# Patient Record
Sex: Male | Born: 1935 | Race: White | Hispanic: No | Marital: Married | Smoking: Former smoker
Health system: Southern US, Community
[De-identification: ages and names within clinical notes are randomized; demographics above are authoritative.]

## PROBLEM LIST (undated history)

## (undated) DIAGNOSIS — E119 Type 2 diabetes mellitus without complications: Secondary | ICD-10-CM

## (undated) DIAGNOSIS — M109 Gout, unspecified: Secondary | ICD-10-CM

## (undated) DIAGNOSIS — I10 Essential (primary) hypertension: Secondary | ICD-10-CM

## (undated) DIAGNOSIS — E669 Obesity, unspecified: Secondary | ICD-10-CM

## (undated) DIAGNOSIS — B029 Zoster without complications: Secondary | ICD-10-CM

## (undated) DIAGNOSIS — G4730 Sleep apnea, unspecified: Secondary | ICD-10-CM

## (undated) DIAGNOSIS — K219 Gastro-esophageal reflux disease without esophagitis: Secondary | ICD-10-CM

## (undated) DIAGNOSIS — M1990 Unspecified osteoarthritis, unspecified site: Secondary | ICD-10-CM

## (undated) DIAGNOSIS — I2510 Atherosclerotic heart disease of native coronary artery without angina pectoris: Secondary | ICD-10-CM

## (undated) DIAGNOSIS — N189 Chronic kidney disease, unspecified: Secondary | ICD-10-CM

## (undated) DIAGNOSIS — E7800 Pure hypercholesterolemia, unspecified: Secondary | ICD-10-CM

## (undated) DIAGNOSIS — E039 Hypothyroidism, unspecified: Secondary | ICD-10-CM

## (undated) DIAGNOSIS — E785 Hyperlipidemia, unspecified: Secondary | ICD-10-CM

## (undated) DIAGNOSIS — J40 Bronchitis, not specified as acute or chronic: Secondary | ICD-10-CM

## (undated) HISTORY — PX: HERNIA REPAIR: SHX51

## (undated) HISTORY — PX: CHOLECYSTECTOMY: SHX55

## (undated) HISTORY — PX: EYE SURGERY: SHX253

---

## 2004-11-02 ENCOUNTER — Ambulatory Visit: Payer: Self-pay

## 2005-10-30 ENCOUNTER — Ambulatory Visit: Payer: Self-pay | Admitting: Gastroenterology

## 2006-04-04 ENCOUNTER — Inpatient Hospital Stay: Payer: Self-pay | Admitting: Internal Medicine

## 2006-04-04 ENCOUNTER — Other Ambulatory Visit: Payer: Self-pay

## 2006-05-16 ENCOUNTER — Emergency Department: Payer: Self-pay

## 2006-05-16 ENCOUNTER — Other Ambulatory Visit: Payer: Self-pay

## 2006-06-06 ENCOUNTER — Ambulatory Visit: Payer: Self-pay

## 2006-06-13 ENCOUNTER — Other Ambulatory Visit: Payer: Self-pay

## 2006-06-13 ENCOUNTER — Ambulatory Visit: Payer: Self-pay | Admitting: Vascular Surgery

## 2006-06-20 ENCOUNTER — Ambulatory Visit: Payer: Self-pay | Admitting: Vascular Surgery

## 2006-08-27 ENCOUNTER — Ambulatory Visit: Payer: Self-pay | Admitting: Internal Medicine

## 2006-12-24 ENCOUNTER — Ambulatory Visit: Payer: Self-pay | Admitting: Vascular Surgery

## 2007-03-11 ENCOUNTER — Ambulatory Visit: Payer: Self-pay | Admitting: Ophthalmology

## 2007-03-24 ENCOUNTER — Ambulatory Visit: Payer: Self-pay | Admitting: Ophthalmology

## 2007-05-13 ENCOUNTER — Ambulatory Visit: Payer: Self-pay | Admitting: Ophthalmology

## 2007-05-21 ENCOUNTER — Ambulatory Visit: Payer: Self-pay | Admitting: Ophthalmology

## 2007-07-15 IMAGING — CR DG HAND COMPLETE 3+V*L*
1 series · 3 of 3 positions shown · non-contrast
Comparison: none

REASON FOR EXAM: eval for fracture, Dr. Haji Nasir Ahmad Niromand -[id number]
COMMENTS:

[Series 1: view not recorded · 0.17mm/px · 3 of 3 slices shown]
[im 1/3]
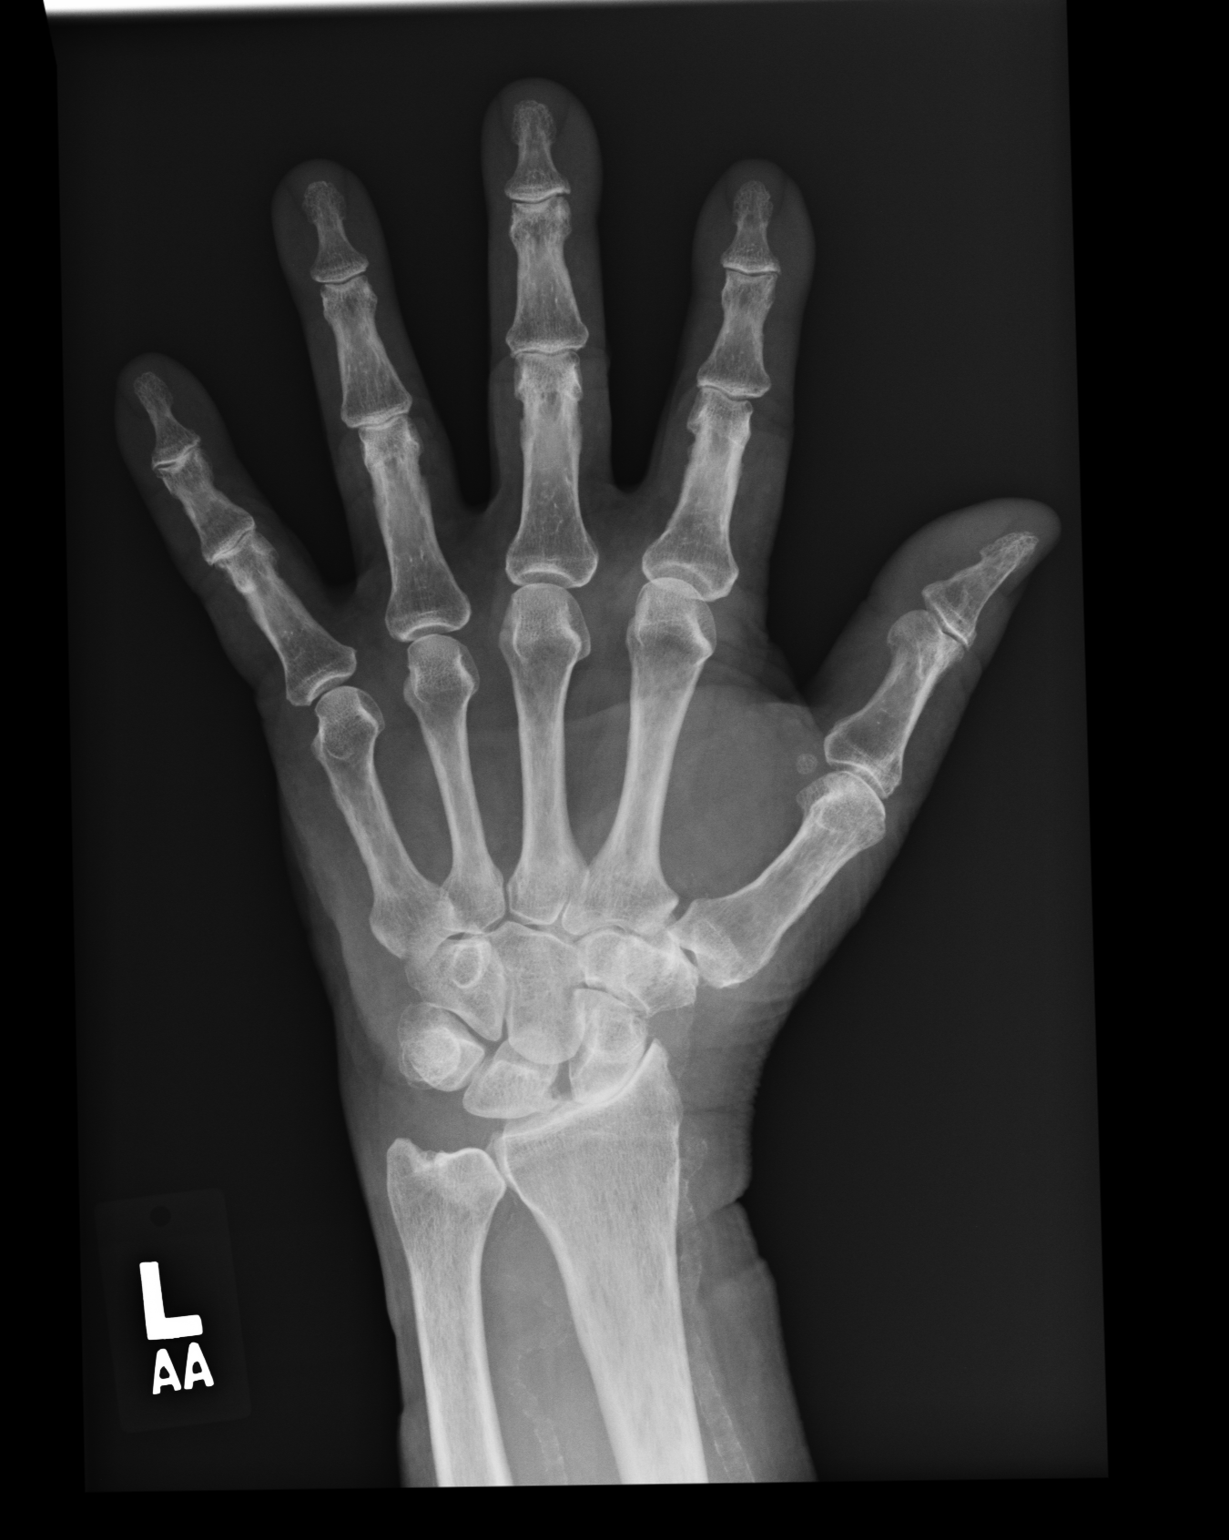
[im 2/3]
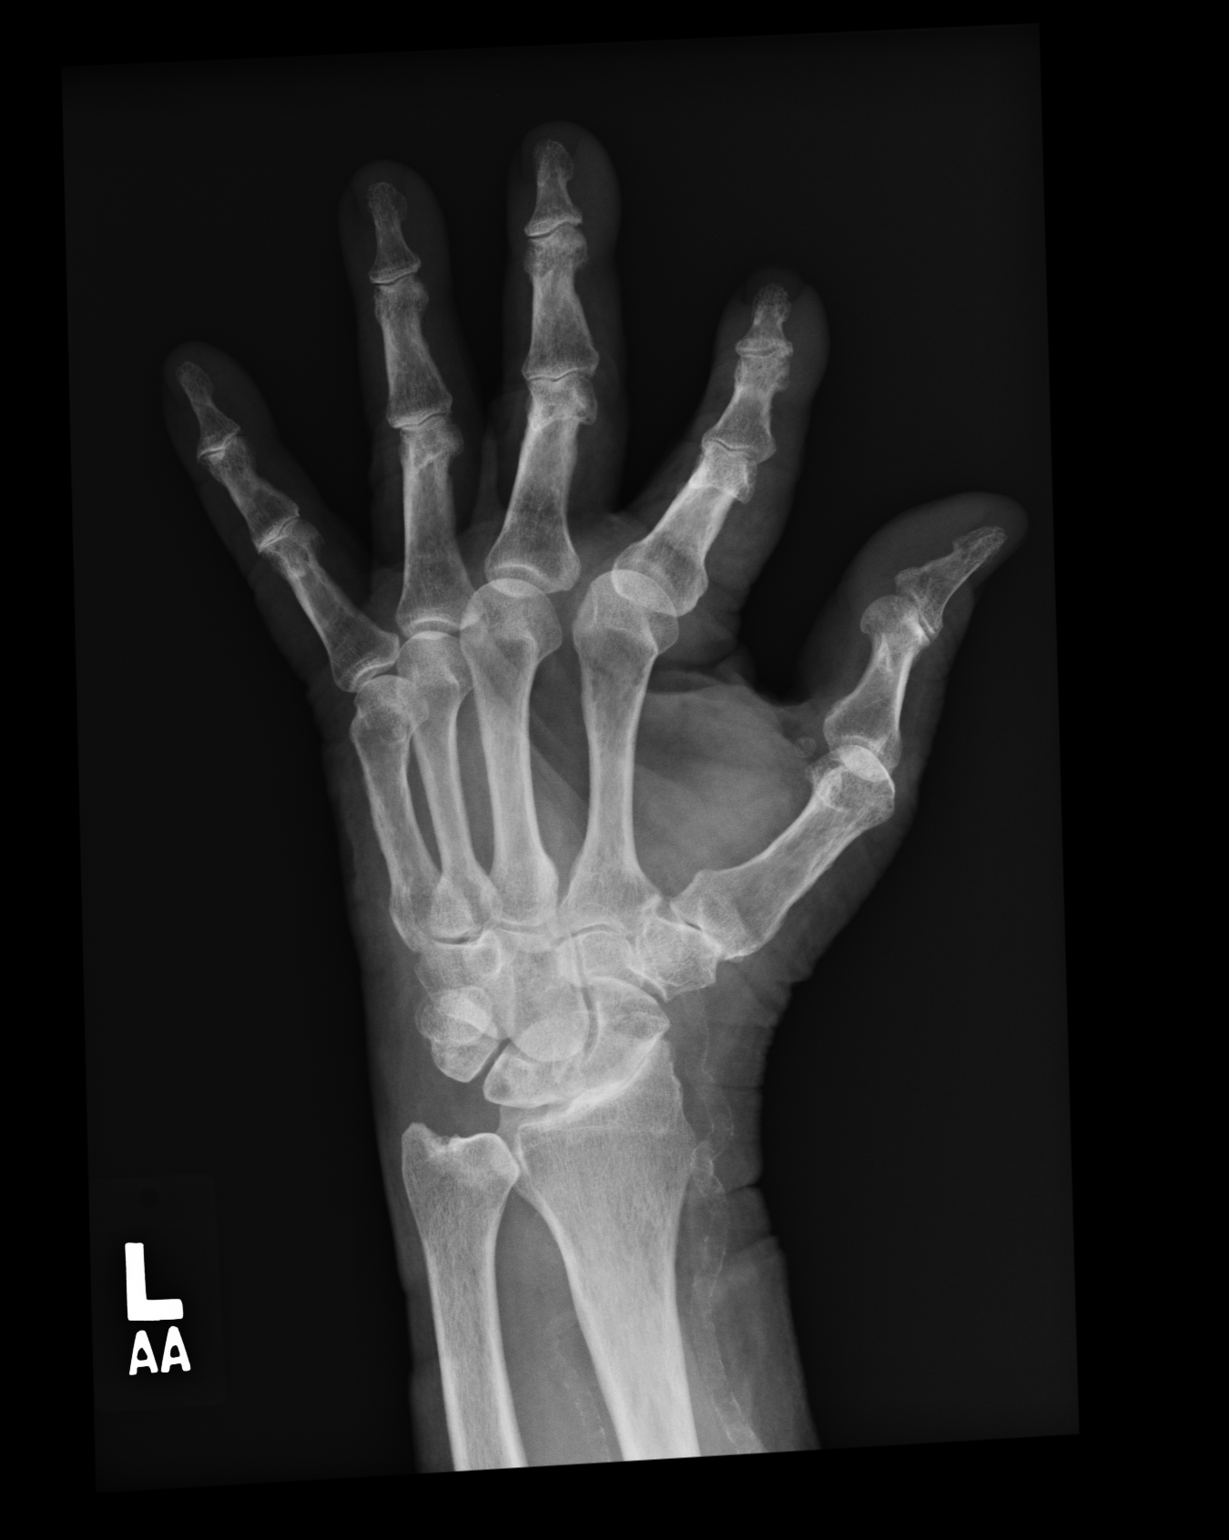
[im 3/3]
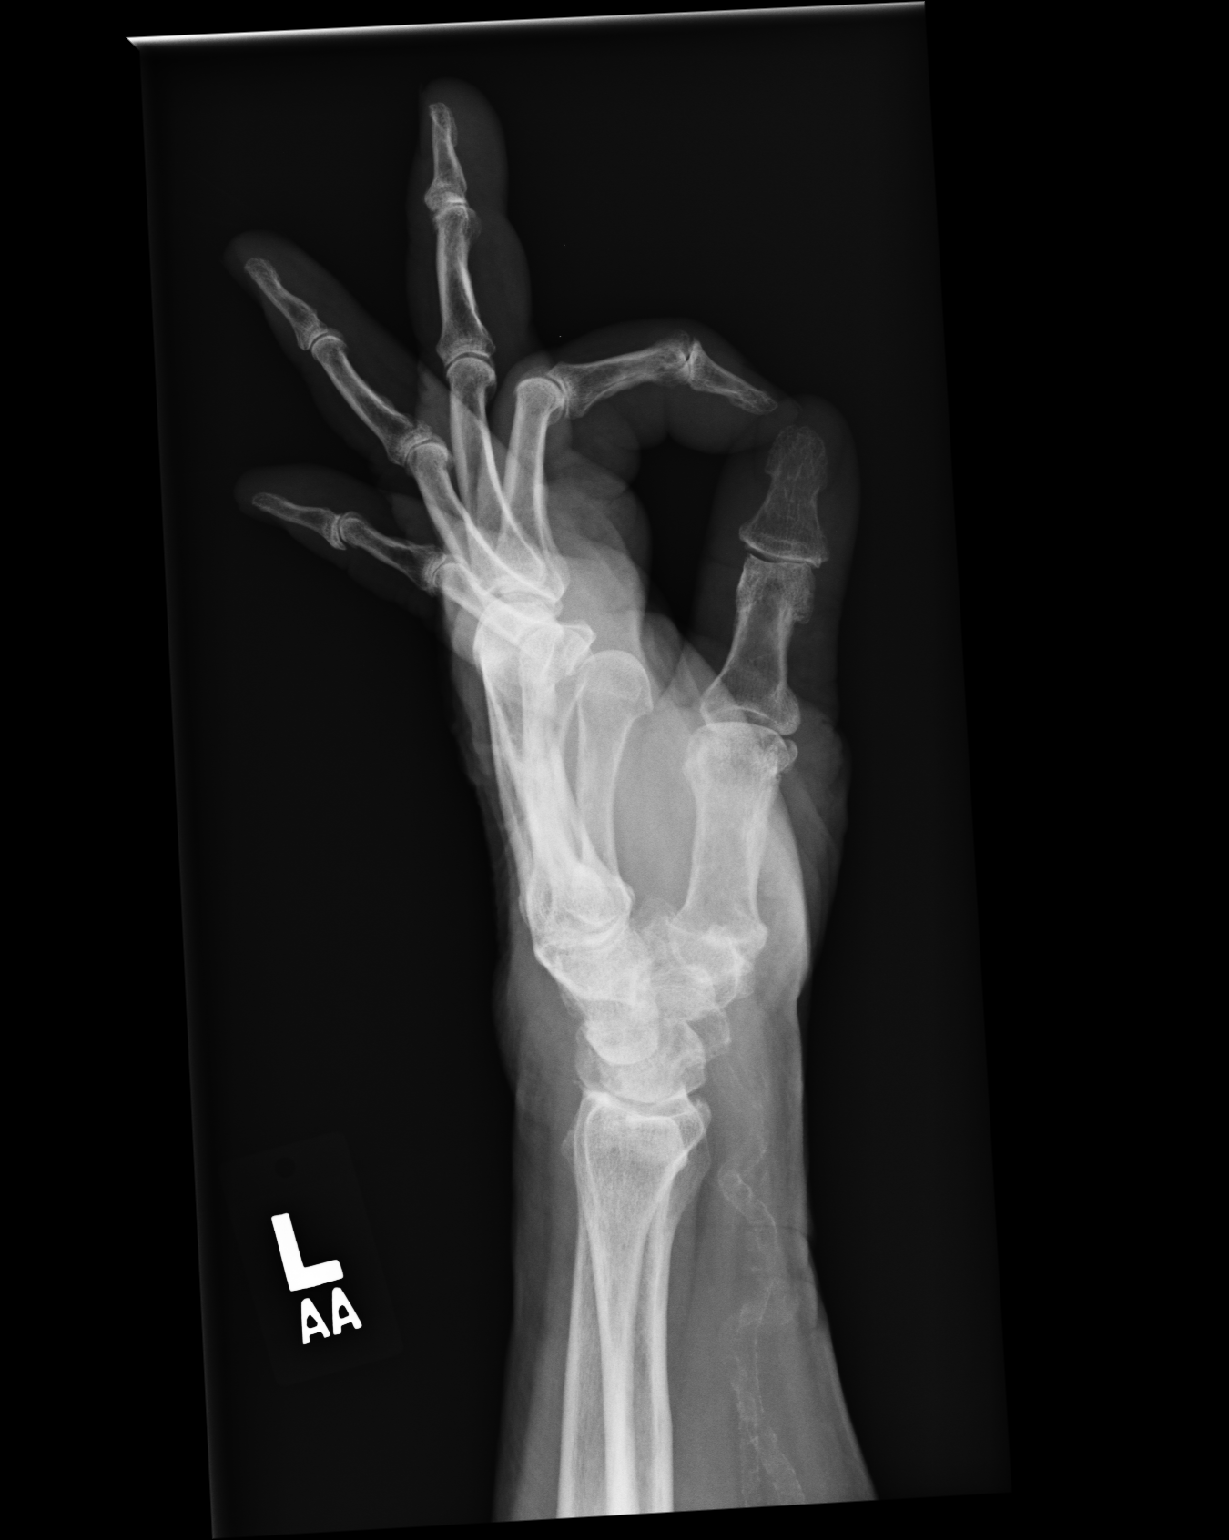

[3 of 3 positions shown; findings below may reference images not displayed]

PROCEDURE:     DXR - DXR HAND LT COMPLETE  W/OBLIQUES  - June 06, 2006  [DATE]

RESULT:     Views of the left hand show significant atherosclerotic
calcification. There is severe degenerative narrowing at the radiocarpal
joint space. Carpometacarpal degenerative change at the base of the thumb is
noted. Some interphalangeal joint space narrowing is noted proximally and
distally in multiple digits and appears to be most pronounced in the fifth
digit. No significant erosion or acute abnormality is demonstrated.
IMPRESSION: 1.     Extensive degenerative changes in the wrist and interphalangeal
joints as described. No acute abnormality demonstrated.

## 2007-08-13 ENCOUNTER — Emergency Department: Payer: Self-pay | Admitting: Emergency Medicine

## 2007-08-15 ENCOUNTER — Ambulatory Visit: Payer: Self-pay | Admitting: Vascular Surgery

## 2007-11-21 ENCOUNTER — Ambulatory Visit: Payer: Self-pay | Admitting: Nephrology

## 2007-12-02 ENCOUNTER — Other Ambulatory Visit: Payer: Self-pay

## 2007-12-02 ENCOUNTER — Emergency Department: Payer: Self-pay | Admitting: Internal Medicine

## 2010-03-14 ENCOUNTER — Ambulatory Visit: Payer: Self-pay | Admitting: Gastroenterology

## 2010-03-30 ENCOUNTER — Ambulatory Visit: Payer: Self-pay | Admitting: Gastroenterology

## 2010-04-03 LAB — PATHOLOGY REPORT

## 2010-05-25 ENCOUNTER — Ambulatory Visit: Payer: Self-pay | Admitting: Gastroenterology

## 2010-05-30 LAB — PATHOLOGY REPORT

## 2010-07-13 ENCOUNTER — Ambulatory Visit: Payer: Self-pay | Admitting: Family Medicine

## 2010-09-28 ENCOUNTER — Observation Stay: Payer: Self-pay | Admitting: Internal Medicine

## 2010-10-20 ENCOUNTER — Inpatient Hospital Stay: Payer: Self-pay | Admitting: Family Medicine

## 2011-09-23 ENCOUNTER — Emergency Department: Payer: Self-pay | Admitting: Emergency Medicine

## 2011-11-28 IMAGING — CR DG CHEST 1V PORT
1 series · 1 of 1 positions shown · non-contrast
Comparison: none

REASON FOR EXAM: BODY SWELLING
COMMENTS:

PROCEDURE:     DXR - DXR PORTABLE CHEST SINGLE VIEW  - October 20, 2010  [DATE]
RESULT:     The lungs are clear. The cardiovascular structures are
unremarkable.

[view not recorded]
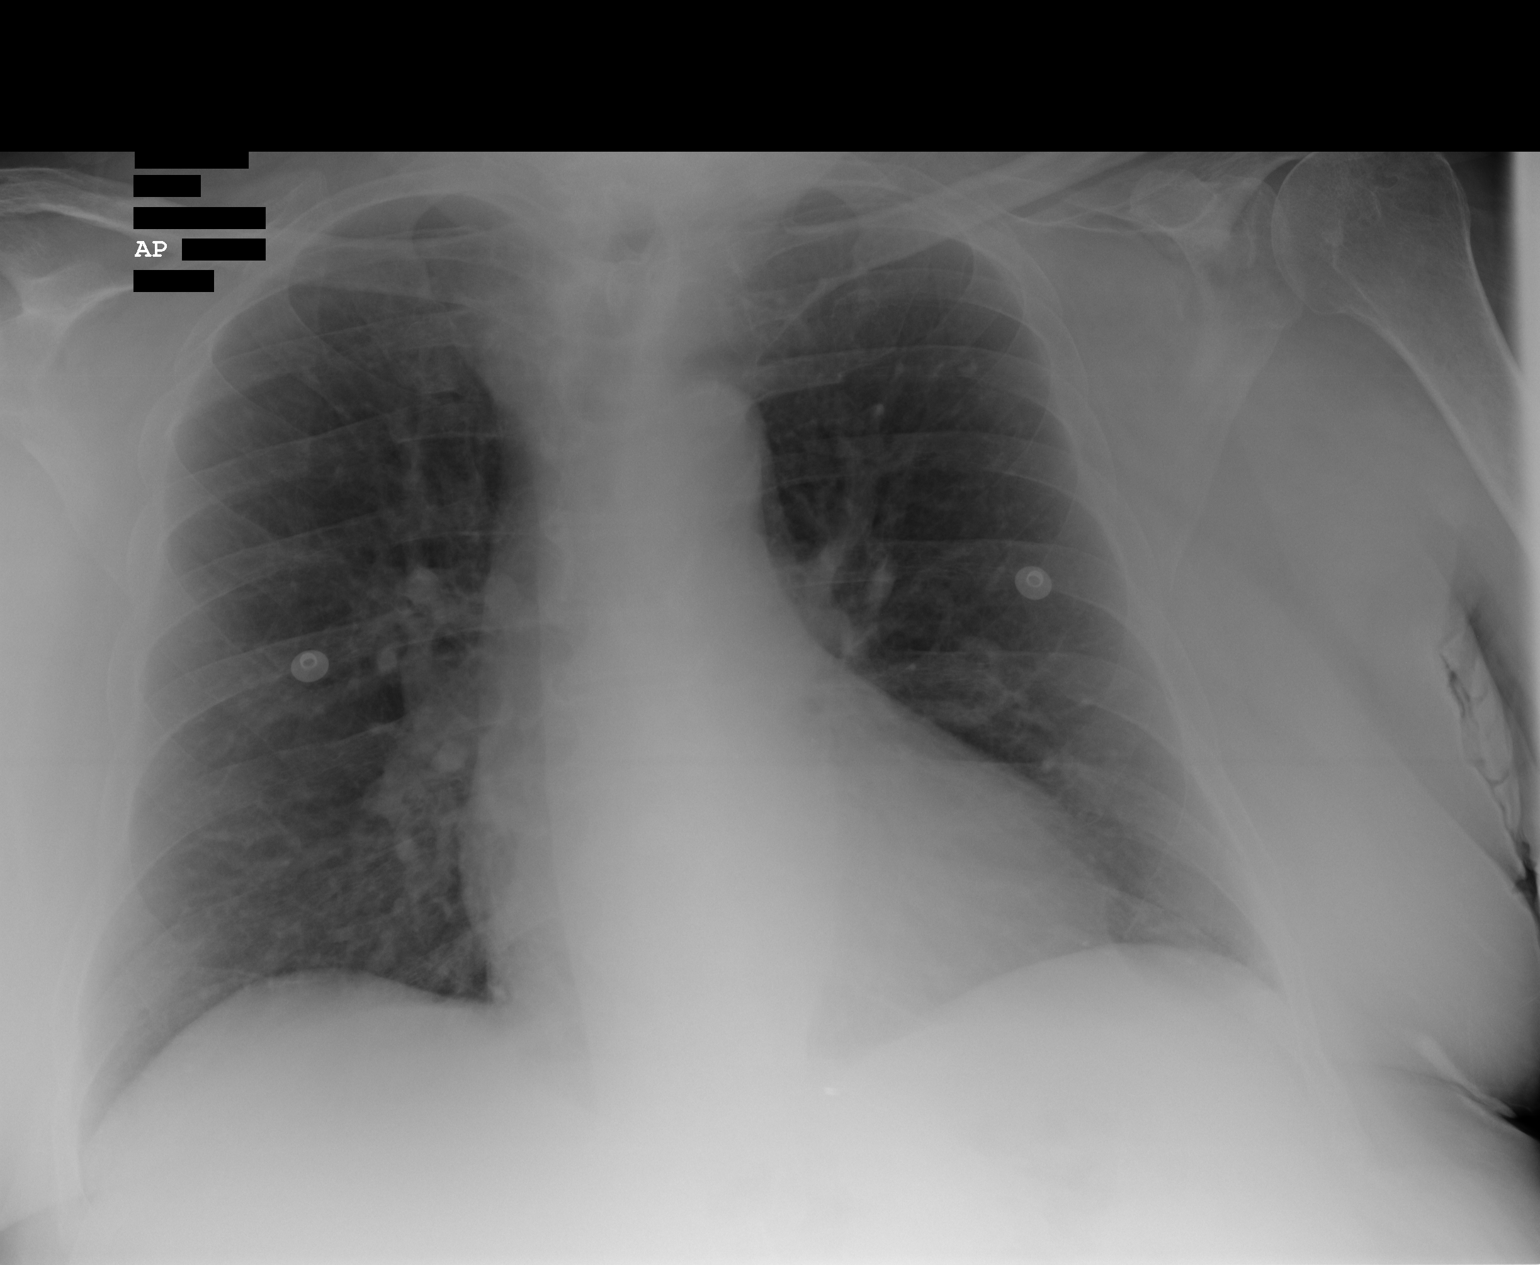

[1 of 1 positions shown; findings below may reference images not displayed]

IMPRESSION: 1. No acute abnormality.

## 2011-11-29 IMAGING — CR DG CHEST 2V
1 series · 3 of 3 positions shown · non-contrast
Comparison: none

REASON FOR EXAM: crackles. Perform AFTER hemodialysis
COMMENTS:

PROCEDURE:     DXR - DXR CHEST PA (OR AP) AND LATERAL  - October 21, 2010  [DATE]
RESULT:     The lungs are clear. The cardiovascular structures are stable
with stable cardiomegaly. No congestive heart failure or focal infiltrate.

[Series 1: view not recorded · 0.17mm/px · 3 of 3 slices shown]
[im 1/3]
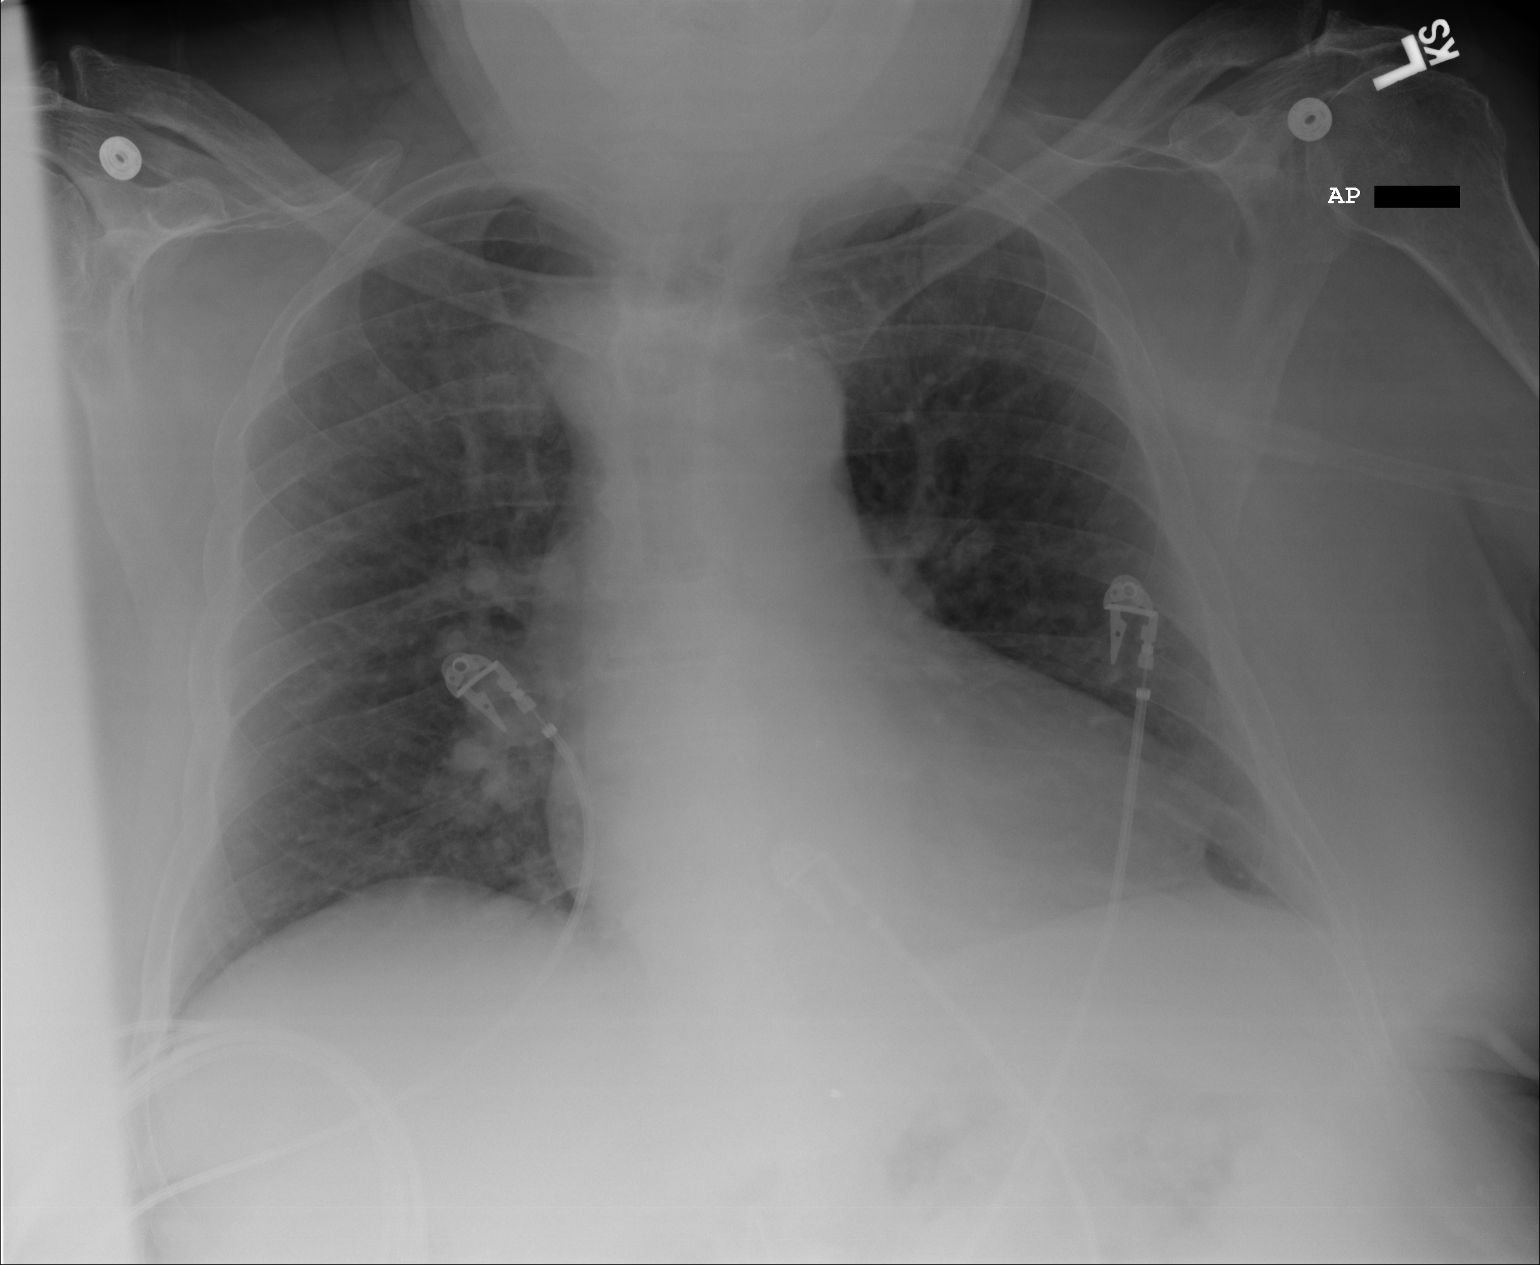
[im 2/3]
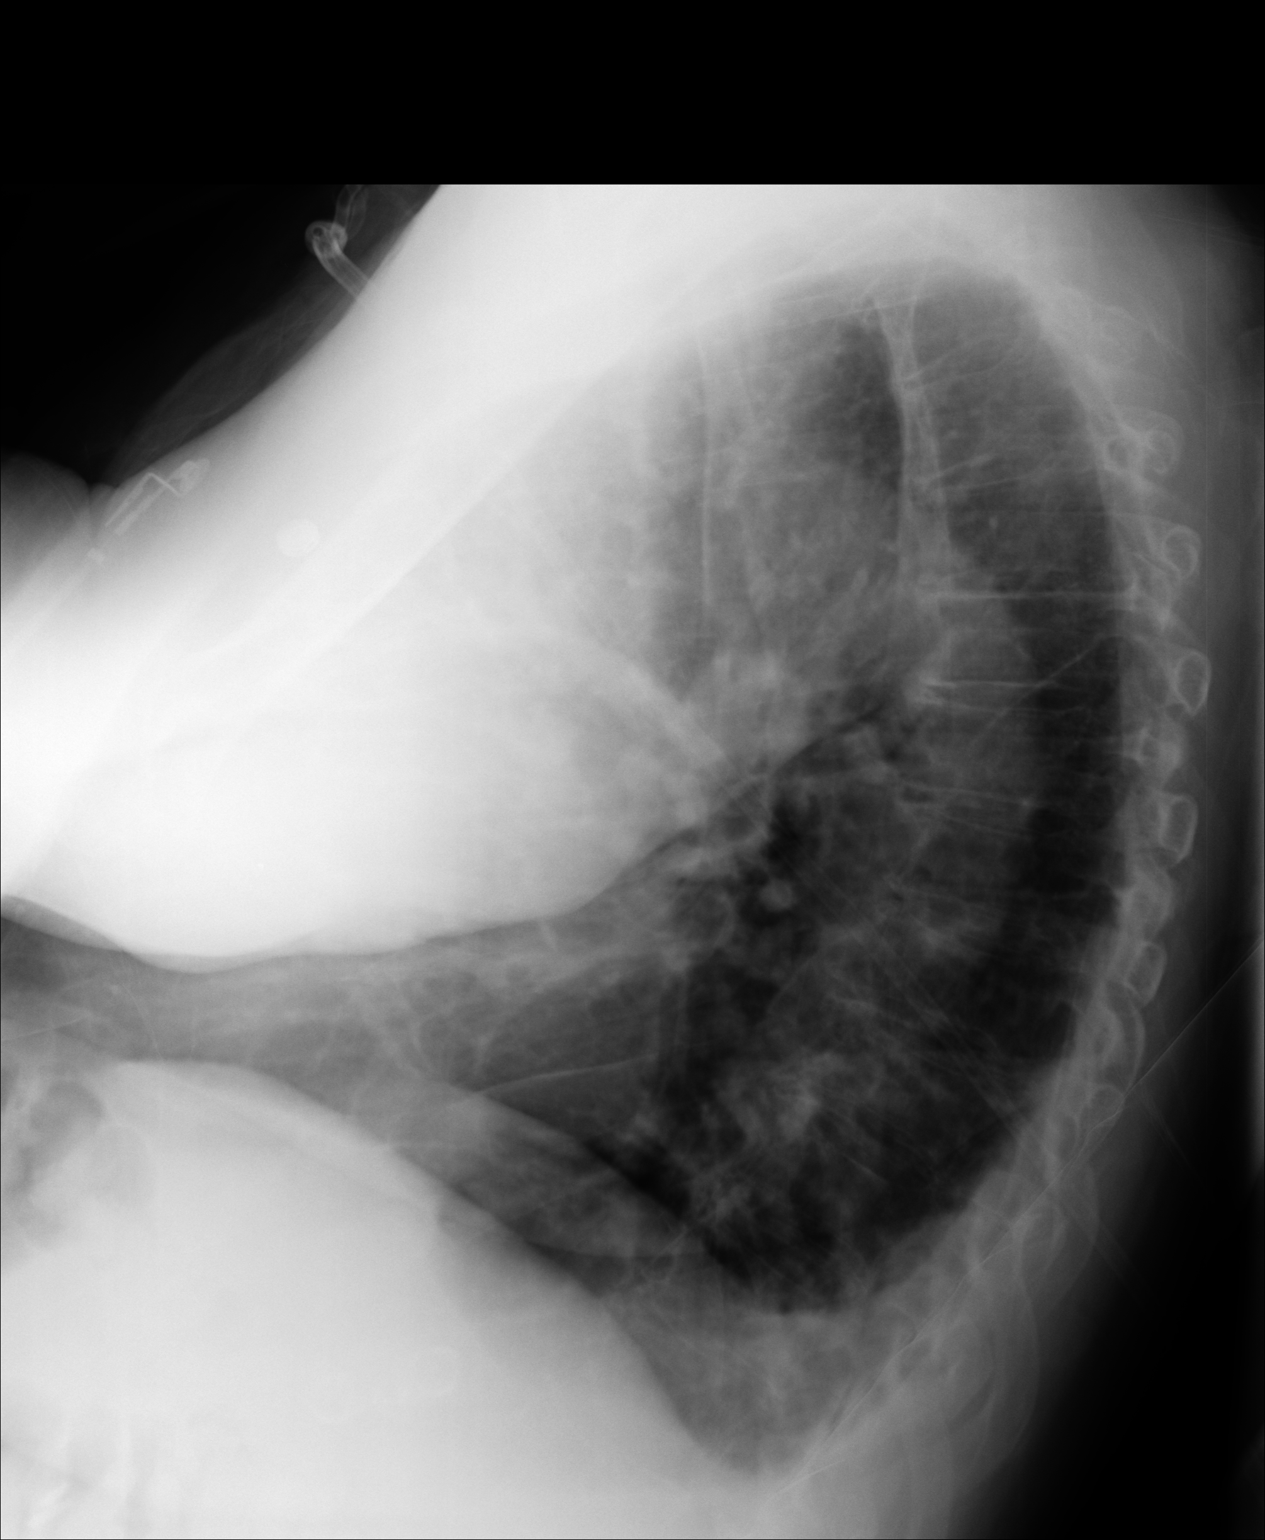
[im 3/3]
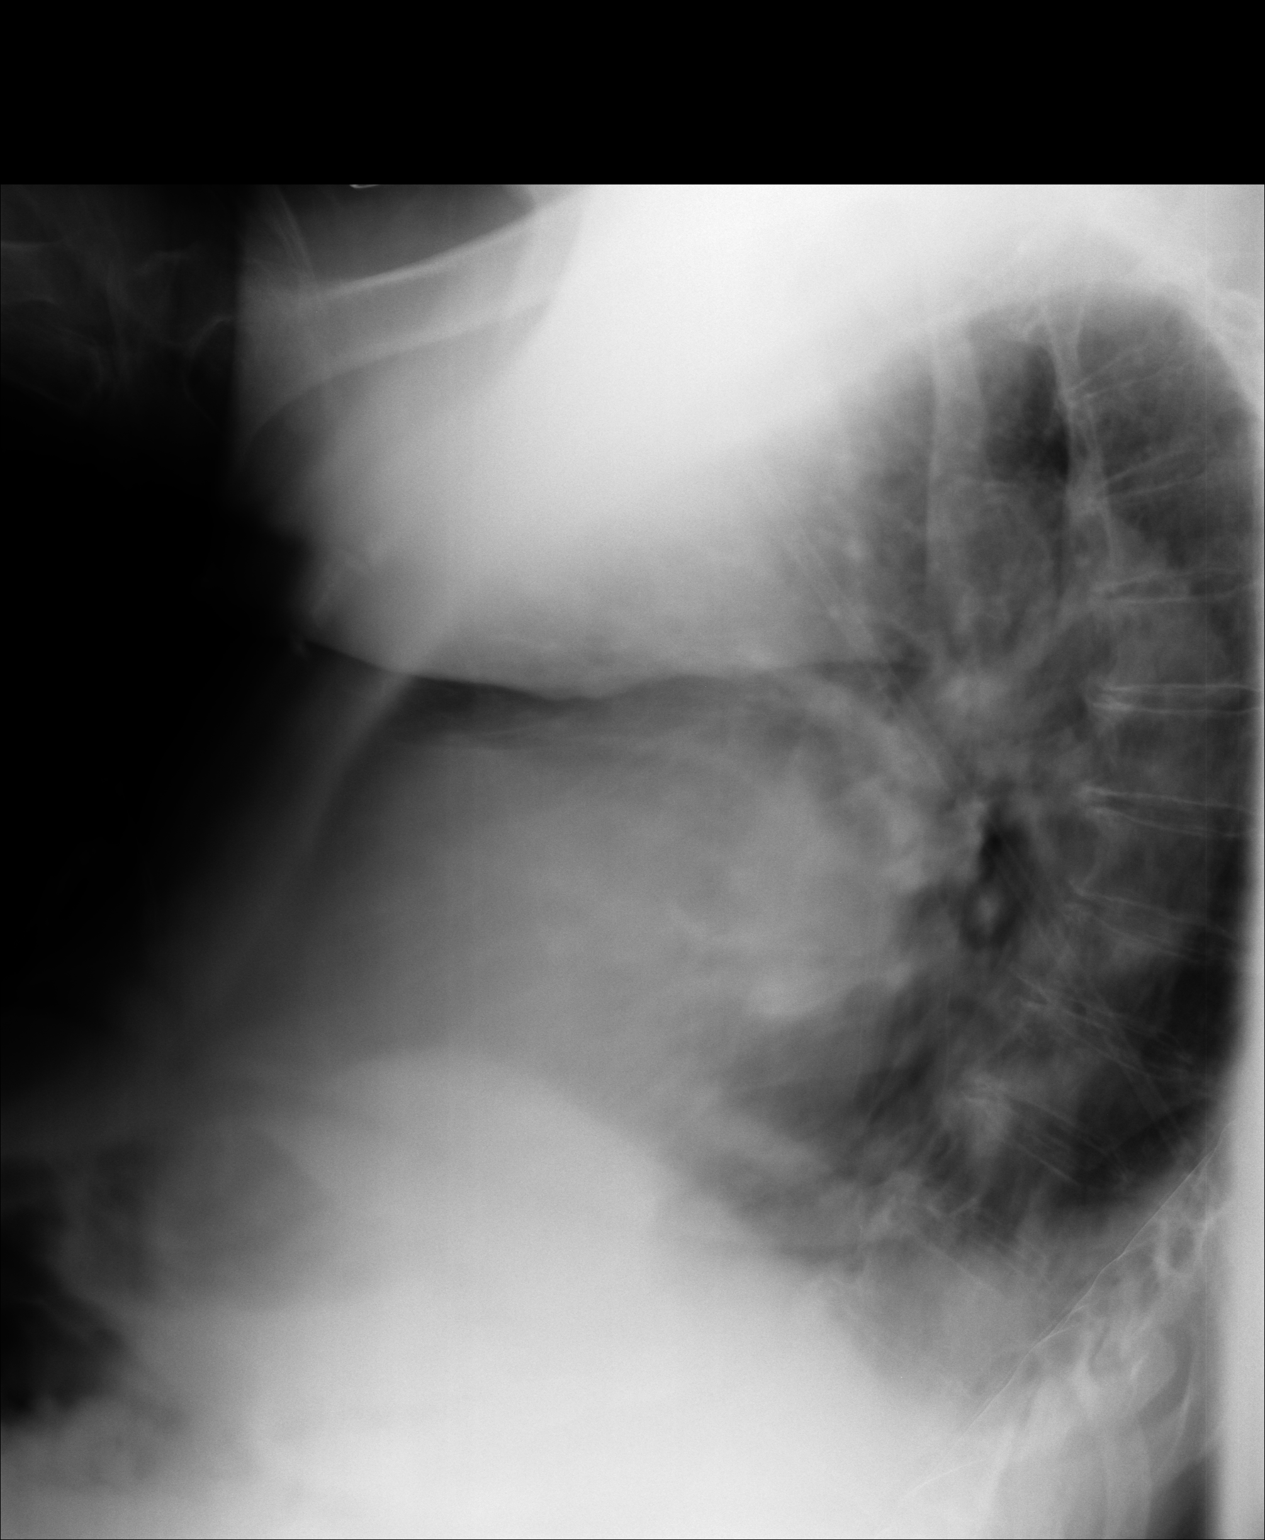

[3 of 3 positions shown; findings below may reference images not displayed]

IMPRESSION: 1. Stable cardiomegaly. No acute abnormality.

## 2012-02-12 ENCOUNTER — Ambulatory Visit: Payer: Self-pay | Admitting: Vascular Surgery

## 2012-02-16 ENCOUNTER — Emergency Department: Payer: Self-pay | Admitting: Emergency Medicine

## 2012-02-16 LAB — CBC
HCT: 32.8 % — ABNORMAL LOW (ref 40.0–52.0)
HGB: 11 g/dL — ABNORMAL LOW (ref 13.0–18.0)
MCH: 36.8 pg — ABNORMAL HIGH (ref 26.0–34.0)
MCHC: 33.6 g/dL (ref 32.0–36.0)
MCV: 110 fL — ABNORMAL HIGH (ref 80–100)
RBC: 3 10*6/uL — ABNORMAL LOW (ref 4.40–5.90)
WBC: 8.5 10*3/uL (ref 3.8–10.6)

## 2012-02-16 LAB — BASIC METABOLIC PANEL
Calcium, Total: 9.1 mg/dL (ref 8.5–10.1)
Chloride: 100 mmol/L (ref 98–107)
Creatinine: 5.73 mg/dL — ABNORMAL HIGH (ref 0.60–1.30)
EGFR (Non-African Amer.): 8 — ABNORMAL LOW
Glucose: 188 mg/dL — ABNORMAL HIGH (ref 65–99)
Potassium: 4.8 mmol/L (ref 3.5–5.1)
Sodium: 138 mmol/L (ref 136–145)

## 2012-06-05 ENCOUNTER — Ambulatory Visit: Payer: Self-pay | Admitting: Vascular Surgery

## 2012-07-24 ENCOUNTER — Ambulatory Visit: Payer: Self-pay | Admitting: Vascular Surgery

## 2012-07-24 LAB — BASIC METABOLIC PANEL
Anion Gap: 5 — ABNORMAL LOW (ref 7–16)
BUN: 17 mg/dL (ref 7–18)
Calcium, Total: 8.8 mg/dL (ref 8.5–10.1)
Chloride: 98 mmol/L (ref 98–107)
Co2: 32 mmol/L (ref 21–32)
Creatinine: 4.88 mg/dL — ABNORMAL HIGH (ref 0.60–1.30)
EGFR (African American): 11 — ABNORMAL LOW
Sodium: 135 mmol/L — ABNORMAL LOW (ref 136–145)

## 2012-07-24 LAB — CBC
HCT: 33.5 % — ABNORMAL LOW
HGB: 11.4 g/dL — ABNORMAL LOW
MCH: 36.1 pg — ABNORMAL HIGH
MCHC: 34.1 g/dL
MCV: 106 fL — ABNORMAL HIGH
Platelet: 165 x10 3/mm 3
RBC: 3.17 x10 6/mm 3 — ABNORMAL LOW
RDW: 14.8 % — ABNORMAL HIGH
WBC: 8.2 x10 3/mm 3

## 2012-08-03 ENCOUNTER — Inpatient Hospital Stay: Payer: Self-pay | Admitting: Family Medicine

## 2012-08-03 LAB — COMPREHENSIVE METABOLIC PANEL
Albumin: 3.6 g/dL (ref 3.4–5.0)
Anion Gap: 9 (ref 7–16)
BUN: 25 mg/dL — ABNORMAL HIGH (ref 7–18)
Calcium, Total: 9.1 mg/dL (ref 8.5–10.1)
Co2: 27 mmol/L (ref 21–32)
EGFR (African American): 8 — ABNORMAL LOW
EGFR (Non-African Amer.): 7 — ABNORMAL LOW
Glucose: 225 mg/dL — ABNORMAL HIGH (ref 65–99)
SGPT (ALT): 13 U/L (ref 12–78)
Total Protein: 7.5 g/dL (ref 6.4–8.2)

## 2012-08-03 LAB — CBC WITH DIFFERENTIAL/PLATELET
Basophil #: 0.1 10*3/uL (ref 0.0–0.1)
Basophil %: 0.6 %
Eosinophil #: 0.2 10*3/uL (ref 0.0–0.7)
Eosinophil %: 1.6 %
Lymphocyte #: 1.5 10*3/uL (ref 1.0–3.6)
Lymphocyte %: 12.5 %
Monocyte %: 6.1 %
Neutrophil #: 9.2 10*3/uL — ABNORMAL HIGH (ref 1.4–6.5)
Neutrophil %: 79.2 %
Platelet: 215 10*3/uL (ref 150–440)
RBC: 3.71 10*6/uL — ABNORMAL LOW (ref 4.40–5.90)
WBC: 11.6 10*3/uL — ABNORMAL HIGH (ref 3.8–10.6)

## 2012-08-03 LAB — TROPONIN I: Troponin-I: 0.02 ng/mL

## 2012-08-03 LAB — PRO B NATRIURETIC PEPTIDE: B-Type Natriuretic Peptide: 41276 pg/mL — ABNORMAL HIGH (ref 0–450)

## 2012-08-04 LAB — CBC WITH DIFFERENTIAL/PLATELET
Basophil %: 0.4 %
Eosinophil #: 0 10*3/uL (ref 0.0–0.7)
Eosinophil %: 0 %
HGB: 11.7 g/dL — ABNORMAL LOW (ref 13.0–18.0)
Lymphocyte #: 0.4 10*3/uL — ABNORMAL LOW (ref 1.0–3.6)
MCHC: 33.1 g/dL (ref 32.0–36.0)
MCV: 106 fL — ABNORMAL HIGH (ref 80–100)
Monocyte #: 0.1 x10 3/mm — ABNORMAL LOW (ref 0.2–1.0)
Neutrophil %: 89.7 %
Platelet: 172 10*3/uL (ref 150–440)
RDW: 15.1 % — ABNORMAL HIGH (ref 11.5–14.5)

## 2012-08-04 LAB — BASIC METABOLIC PANEL
BUN: 12 mg/dL (ref 7–18)
Calcium, Total: 8.9 mg/dL (ref 8.5–10.1)
Chloride: 102 mmol/L (ref 98–107)
Co2: 33 mmol/L — ABNORMAL HIGH (ref 21–32)
Creatinine: 3.7 mg/dL — ABNORMAL HIGH (ref 0.60–1.30)
EGFR (African American): 16 — ABNORMAL LOW
EGFR (Non-African Amer.): 14 — ABNORMAL LOW
Glucose: 158 mg/dL — ABNORMAL HIGH (ref 65–99)
Potassium: 4.8 mmol/L (ref 3.5–5.1)
Sodium: 138 mmol/L (ref 136–145)

## 2012-08-04 LAB — LIPID PANEL
Ldl Cholesterol, Calc: 54 mg/dL (ref 0–100)
VLDL Cholesterol, Calc: 10 mg/dL (ref 5–40)

## 2012-08-04 LAB — CLOSTRIDIUM DIFFICILE BY PCR

## 2012-08-04 LAB — PHOSPHORUS: Phosphorus: 3.1 mg/dL (ref 2.5–4.9)

## 2012-08-04 LAB — TROPONIN I
Troponin-I: 0.04 ng/mL
Troponin-I: 0.03 ng/mL

## 2012-08-05 LAB — RENAL FUNCTION PANEL
Albumin: 3 g/dL — ABNORMAL LOW (ref 3.4–5.0)
Anion Gap: 5 — ABNORMAL LOW (ref 7–16)
Chloride: 99 mmol/L (ref 98–107)
Co2: 34 mmol/L — ABNORMAL HIGH (ref 21–32)
Creatinine: 3.93 mg/dL — ABNORMAL HIGH (ref 0.60–1.30)
EGFR (African American): 15 — ABNORMAL LOW
Glucose: 112 mg/dL — ABNORMAL HIGH (ref 65–99)
Potassium: 4.1 mmol/L (ref 3.5–5.1)
Sodium: 138 mmol/L (ref 136–145)

## 2012-08-06 LAB — BASIC METABOLIC PANEL
Anion Gap: 5 — ABNORMAL LOW (ref 7–16)
BUN: 23 mg/dL — ABNORMAL HIGH (ref 7–18)
Calcium, Total: 8.8 mg/dL (ref 8.5–10.1)
Chloride: 98 mmol/L (ref 98–107)
Co2: 33 mmol/L — ABNORMAL HIGH (ref 21–32)
Creatinine: 5.38 mg/dL — ABNORMAL HIGH (ref 0.60–1.30)
EGFR (African American): 10 — ABNORMAL LOW
Glucose: 82 mg/dL (ref 65–99)
Potassium: 4.3 mmol/L (ref 3.5–5.1)
Sodium: 136 mmol/L (ref 136–145)

## 2012-08-06 LAB — CBC WITH DIFFERENTIAL/PLATELET
Basophil %: 0.7 %
Eosinophil #: 0.2 10*3/uL (ref 0.0–0.7)
Eosinophil %: 2 %
HCT: 32.5 % — ABNORMAL LOW (ref 40.0–52.0)
Lymphocyte #: 1.5 10*3/uL (ref 1.0–3.6)
MCH: 35.7 pg — ABNORMAL HIGH (ref 26.0–34.0)
MCHC: 33.8 g/dL (ref 32.0–36.0)
MCV: 106 fL — ABNORMAL HIGH (ref 80–100)
Monocyte #: 0.7 x10 3/mm (ref 0.2–1.0)
Monocyte %: 8.5 %
Neutrophil %: 71.1 %
Platelet: 151 10*3/uL (ref 150–440)
WBC: 8.3 10*3/uL (ref 3.8–10.6)

## 2012-08-06 LAB — WBCS, STOOL

## 2012-08-06 LAB — PHOSPHORUS: Phosphorus: 2 mg/dL — ABNORMAL LOW (ref 2.5–4.9)

## 2012-08-07 LAB — CBC WITH DIFFERENTIAL/PLATELET
Eosinophil #: 0.1 10*3/uL (ref 0.0–0.7)
Eosinophil %: 0.7 %
HGB: 11.4 g/dL — ABNORMAL LOW (ref 13.0–18.0)
Lymphocyte #: 0.9 10*3/uL — ABNORMAL LOW (ref 1.0–3.6)
MCH: 34.9 pg — ABNORMAL HIGH (ref 26.0–34.0)
MCV: 105 fL — ABNORMAL HIGH (ref 80–100)
Monocyte #: 1 x10 3/mm (ref 0.2–1.0)
Neutrophil #: 6.2 10*3/uL (ref 1.4–6.5)

## 2012-08-07 LAB — BASIC METABOLIC PANEL
Anion Gap: 6 — ABNORMAL LOW (ref 7–16)
Calcium, Total: 9.1 mg/dL (ref 8.5–10.1)
Chloride: 96 mmol/L — ABNORMAL LOW (ref 98–107)
Creatinine: 4.9 mg/dL — ABNORMAL HIGH (ref 0.60–1.30)
EGFR (African American): 11 — ABNORMAL LOW
EGFR (Non-African Amer.): 10 — ABNORMAL LOW
Osmolality: 273 (ref 275–301)

## 2012-08-08 LAB — CBC WITH DIFFERENTIAL/PLATELET
Basophil #: 0 10*3/uL (ref 0.0–0.1)
Basophil %: 0.3 %
Eosinophil %: 0.6 %
HCT: 33.4 % — ABNORMAL LOW (ref 40.0–52.0)
Lymphocyte #: 0.8 10*3/uL — ABNORMAL LOW (ref 1.0–3.6)
Lymphocyte %: 8.2 %
MCH: 35.5 pg — ABNORMAL HIGH (ref 26.0–34.0)
MCHC: 33.5 g/dL (ref 32.0–36.0)
MCV: 106 fL — ABNORMAL HIGH (ref 80–100)
Monocyte #: 1 x10 3/mm (ref 0.2–1.0)
Monocyte %: 10.2 %
RBC: 3.15 10*6/uL — ABNORMAL LOW (ref 4.40–5.90)
RDW: 14.8 % — ABNORMAL HIGH (ref 11.5–14.5)

## 2012-08-08 LAB — RENAL FUNCTION PANEL
Anion Gap: 6 — ABNORMAL LOW (ref 7–16)
BUN: 36 mg/dL — ABNORMAL HIGH (ref 7–18)
Co2: 31 mmol/L (ref 21–32)
Creatinine: 6.51 mg/dL — ABNORMAL HIGH (ref 0.60–1.30)
EGFR (Non-African Amer.): 7 — ABNORMAL LOW
Osmolality: 277 (ref 275–301)
Potassium: 4.7 mmol/L (ref 3.5–5.1)
Sodium: 134 mmol/L — ABNORMAL LOW (ref 136–145)

## 2012-08-09 LAB — CBC WITH DIFFERENTIAL/PLATELET
Basophil %: 0.5 %
Eosinophil #: 0.1 10*3/uL (ref 0.0–0.7)
Eosinophil %: 1 %
HCT: 33.8 % — ABNORMAL LOW (ref 40.0–52.0)
HGB: 10.9 g/dL — ABNORMAL LOW (ref 13.0–18.0)
Lymphocyte %: 4 %
MCH: 33.8 pg (ref 26.0–34.0)
MCHC: 32.2 g/dL (ref 32.0–36.0)
MCV: 105 fL — ABNORMAL HIGH (ref 80–100)
Monocyte #: 1.1 x10 3/mm — ABNORMAL HIGH (ref 0.2–1.0)
Monocyte %: 7.9 %
Neutrophil %: 86.6 %
Platelet: 154 10*3/uL (ref 150–440)
RBC: 3.21 10*6/uL — ABNORMAL LOW (ref 4.40–5.90)
RDW: 14.6 % — ABNORMAL HIGH (ref 11.5–14.5)
WBC: 13.4 10*3/uL — ABNORMAL HIGH (ref 3.8–10.6)

## 2012-08-09 LAB — BASIC METABOLIC PANEL
Anion Gap: 5 — ABNORMAL LOW (ref 7–16)
Chloride: 98 mmol/L (ref 98–107)
Co2: 30 mmol/L (ref 21–32)
Osmolality: 274 (ref 275–301)
Sodium: 133 mmol/L — ABNORMAL LOW (ref 136–145)

## 2012-08-09 LAB — CULTURE, BLOOD (SINGLE)

## 2012-08-10 LAB — CBC WITH DIFFERENTIAL/PLATELET
Basophil #: 0.1 10*3/uL (ref 0.0–0.1)
Eosinophil #: 0.3 10*3/uL (ref 0.0–0.7)
Eosinophil %: 3.3 %
HCT: 30.5 % — ABNORMAL LOW (ref 40.0–52.0)
Lymphocyte %: 11.7 %
Neutrophil %: 74.8 %
Platelet: 165 10*3/uL (ref 150–440)
WBC: 8.9 10*3/uL (ref 3.8–10.6)

## 2012-08-11 LAB — RENAL FUNCTION PANEL
Albumin: 2.6 g/dL — ABNORMAL LOW (ref 3.4–5.0)
Calcium, Total: 9.3 mg/dL (ref 8.5–10.1)
Chloride: 93 mmol/L — ABNORMAL LOW (ref 98–107)
Co2: 29 mmol/L (ref 21–32)
Osmolality: 278 (ref 275–301)
Sodium: 132 mmol/L — ABNORMAL LOW (ref 136–145)

## 2012-08-11 LAB — CBC WITH DIFFERENTIAL/PLATELET
Basophil #: 0.1 10*3/uL (ref 0.0–0.1)
Basophil %: 0.8 %
HGB: 10.5 g/dL — ABNORMAL LOW (ref 13.0–18.0)
Lymphocyte #: 0.8 10*3/uL — ABNORMAL LOW (ref 1.0–3.6)
Lymphocyte %: 11.9 %
MCH: 34.3 pg — ABNORMAL HIGH (ref 26.0–34.0)
MCHC: 32.6 g/dL (ref 32.0–36.0)
Monocyte #: 0.5 x10 3/mm (ref 0.2–1.0)
Neutrophil #: 5 10*3/uL (ref 1.4–6.5)
Neutrophil %: 75.2 %
RBC: 3.06 10*6/uL — ABNORMAL LOW (ref 4.40–5.90)
RDW: 14.8 % — ABNORMAL HIGH (ref 11.5–14.5)
WBC: 6.6 10*3/uL (ref 3.8–10.6)

## 2013-05-10 ENCOUNTER — Inpatient Hospital Stay: Payer: Self-pay | Admitting: Family Medicine

## 2013-05-10 LAB — COMPREHENSIVE METABOLIC PANEL
ALBUMIN: 3.2 g/dL — AB (ref 3.4–5.0)
Alkaline Phosphatase: 78 U/L
Anion Gap: 7 (ref 7–16)
BUN: 44 mg/dL — ABNORMAL HIGH (ref 7–18)
Bilirubin,Total: 0.4 mg/dL (ref 0.2–1.0)
Calcium, Total: 9.5 mg/dL (ref 8.5–10.1)
Chloride: 95 mmol/L — ABNORMAL LOW (ref 98–107)
Co2: 29 mmol/L (ref 21–32)
Creatinine: 8.29 mg/dL — ABNORMAL HIGH (ref 0.60–1.30)
EGFR (African American): 6 — ABNORMAL LOW
EGFR (Non-African Amer.): 5 — ABNORMAL LOW
Glucose: 164 mg/dL — ABNORMAL HIGH (ref 65–99)
Osmolality: 277 (ref 275–301)
POTASSIUM: 5 mmol/L (ref 3.5–5.1)
SGOT(AST): 13 U/L — ABNORMAL LOW (ref 15–37)
SGPT (ALT): 15 U/L (ref 12–78)
SODIUM: 131 mmol/L — AB (ref 136–145)
Total Protein: 7.2 g/dL (ref 6.4–8.2)

## 2013-05-10 LAB — CBC
HCT: 30.5 % — AB (ref 40.0–52.0)
HGB: 10.4 g/dL — AB (ref 13.0–18.0)
MCH: 36.3 pg — AB (ref 26.0–34.0)
MCHC: 34 g/dL (ref 32.0–36.0)
MCV: 107 fL — AB (ref 80–100)
PLATELETS: 170 10*3/uL (ref 150–440)
RBC: 2.85 10*6/uL — AB (ref 4.40–5.90)
RDW: 14.5 % (ref 11.5–14.5)
WBC: 13.2 10*3/uL — ABNORMAL HIGH (ref 3.8–10.6)

## 2013-05-10 LAB — PROTIME-INR
INR: 1
PROTHROMBIN TIME: 13.2 s (ref 11.5–14.7)

## 2013-05-11 LAB — CBC WITH DIFFERENTIAL/PLATELET
Basophil #: 0 10*3/uL (ref 0.0–0.1)
Basophil %: 0.1 %
EOS ABS: 0 10*3/uL (ref 0.0–0.7)
Eosinophil %: 0 %
HCT: 27.4 % — ABNORMAL LOW (ref 40.0–52.0)
HGB: 9.5 g/dL — AB (ref 13.0–18.0)
Lymphocyte #: 0.3 10*3/uL — ABNORMAL LOW (ref 1.0–3.6)
Lymphocyte %: 2.2 %
MCH: 37.4 pg — ABNORMAL HIGH (ref 26.0–34.0)
MCHC: 34.7 g/dL (ref 32.0–36.0)
MCV: 108 fL — AB (ref 80–100)
MONO ABS: 0.2 x10 3/mm (ref 0.2–1.0)
Monocyte %: 1.3 %
NEUTROS PCT: 96.4 %
Neutrophil #: 12.4 10*3/uL — ABNORMAL HIGH (ref 1.4–6.5)
Platelet: 145 10*3/uL — ABNORMAL LOW (ref 150–440)
RBC: 2.54 10*6/uL — AB (ref 4.40–5.90)
RDW: 14.4 % (ref 11.5–14.5)
WBC: 12.8 10*3/uL — AB (ref 3.8–10.6)

## 2013-05-11 LAB — BASIC METABOLIC PANEL
ANION GAP: 8 (ref 7–16)
BUN: 52 mg/dL — ABNORMAL HIGH (ref 7–18)
CO2: 25 mmol/L (ref 21–32)
CREATININE: 8.82 mg/dL — AB (ref 0.60–1.30)
Calcium, Total: 9.3 mg/dL (ref 8.5–10.1)
Chloride: 93 mmol/L — ABNORMAL LOW (ref 98–107)
EGFR (Non-African Amer.): 5 — ABNORMAL LOW
GFR CALC AF AMER: 6 — AB
GLUCOSE: 258 mg/dL — AB (ref 65–99)
Osmolality: 276 (ref 275–301)
Potassium: 6 mmol/L — ABNORMAL HIGH (ref 3.5–5.1)
Sodium: 126 mmol/L — ABNORMAL LOW (ref 136–145)

## 2013-05-11 LAB — PHOSPHORUS: Phosphorus: 4.5 mg/dL (ref 2.5–4.9)

## 2013-05-12 LAB — CBC WITH DIFFERENTIAL/PLATELET
BASOS ABS: 0 10*3/uL (ref 0.0–0.1)
Basophil %: 0.3 %
EOS ABS: 0 10*3/uL (ref 0.0–0.7)
Eosinophil %: 0.2 %
HCT: 28.1 % — ABNORMAL LOW (ref 40.0–52.0)
HGB: 9.8 g/dL — ABNORMAL LOW (ref 13.0–18.0)
Lymphocyte #: 0.9 10*3/uL — ABNORMAL LOW (ref 1.0–3.6)
Lymphocyte %: 10.3 %
MCH: 37 pg — AB (ref 26.0–34.0)
MCHC: 34.7 g/dL (ref 32.0–36.0)
MCV: 107 fL — ABNORMAL HIGH (ref 80–100)
Monocyte #: 1 x10 3/mm (ref 0.2–1.0)
Monocyte %: 11.3 %
NEUTROS ABS: 7.1 10*3/uL — AB (ref 1.4–6.5)
Neutrophil %: 77.9 %
PLATELETS: 170 10*3/uL (ref 150–440)
RBC: 2.64 10*6/uL — ABNORMAL LOW (ref 4.40–5.90)
RDW: 14 % (ref 11.5–14.5)
WBC: 9.1 10*3/uL (ref 3.8–10.6)

## 2013-05-12 LAB — BASIC METABOLIC PANEL
ANION GAP: 8 (ref 7–16)
BUN: 26 mg/dL — ABNORMAL HIGH (ref 7–18)
CALCIUM: 9.1 mg/dL (ref 8.5–10.1)
CHLORIDE: 98 mmol/L (ref 98–107)
CO2: 33 mmol/L — AB (ref 21–32)
Creatinine: 5.27 mg/dL — ABNORMAL HIGH (ref 0.60–1.30)
EGFR (African American): 10 — ABNORMAL LOW
EGFR (Non-African Amer.): 9 — ABNORMAL LOW
Glucose: 105 mg/dL — ABNORMAL HIGH (ref 65–99)
OSMOLALITY: 283 (ref 275–301)
Potassium: 4.2 mmol/L (ref 3.5–5.1)
Sodium: 139 mmol/L (ref 136–145)

## 2013-05-13 LAB — RENAL FUNCTION PANEL
Albumin: 2.8 g/dL — ABNORMAL LOW (ref 3.4–5.0)
Anion Gap: 6 — ABNORMAL LOW (ref 7–16)
BUN: 41 mg/dL — ABNORMAL HIGH (ref 7–18)
Calcium, Total: 9.4 mg/dL (ref 8.5–10.1)
Chloride: 96 mmol/L — ABNORMAL LOW (ref 98–107)
Co2: 31 mmol/L (ref 21–32)
Creatinine: 7.23 mg/dL — ABNORMAL HIGH (ref 0.60–1.30)
EGFR (African American): 7 — ABNORMAL LOW
EGFR (Non-African Amer.): 6 — ABNORMAL LOW
Glucose: 99 mg/dL (ref 65–99)
Osmolality: 277 (ref 275–301)
Phosphorus: 4.7 mg/dL (ref 2.5–4.9)
Potassium: 4.4 mmol/L (ref 3.5–5.1)
Sodium: 133 mmol/L — ABNORMAL LOW (ref 136–145)

## 2013-05-13 LAB — CBC WITH DIFFERENTIAL/PLATELET
Basophil #: 0 10*3/uL (ref 0.0–0.1)
Basophil %: 0.5 %
EOS PCT: 1.6 %
Eosinophil #: 0.1 10*3/uL (ref 0.0–0.7)
HCT: 29.4 % — ABNORMAL LOW (ref 40.0–52.0)
HGB: 9.9 g/dL — ABNORMAL LOW (ref 13.0–18.0)
Lymphocyte #: 0.7 10*3/uL — ABNORMAL LOW (ref 1.0–3.6)
Lymphocyte %: 9.8 %
MCH: 36.4 pg — ABNORMAL HIGH (ref 26.0–34.0)
MCHC: 33.8 g/dL (ref 32.0–36.0)
MCV: 108 fL — ABNORMAL HIGH (ref 80–100)
Monocyte #: 1.1 x10 3/mm — ABNORMAL HIGH (ref 0.2–1.0)
Monocyte %: 14.3 %
NEUTROS ABS: 5.4 10*3/uL (ref 1.4–6.5)
NEUTROS PCT: 73.8 %
Platelet: 164 10*3/uL (ref 150–440)
RBC: 2.73 10*6/uL — ABNORMAL LOW (ref 4.40–5.90)
RDW: 14.1 % (ref 11.5–14.5)
WBC: 7.4 10*3/uL (ref 3.8–10.6)

## 2013-05-14 LAB — CBC WITH DIFFERENTIAL/PLATELET
Basophil #: 0 10*3/uL (ref 0.0–0.1)
Basophil %: 0.7 %
EOS ABS: 0.1 10*3/uL (ref 0.0–0.7)
Eosinophil %: 1.6 %
HCT: 28.8 % — ABNORMAL LOW (ref 40.0–52.0)
HGB: 9.9 g/dL — AB (ref 13.0–18.0)
LYMPHS PCT: 13.3 %
Lymphocyte #: 0.9 10*3/uL — ABNORMAL LOW (ref 1.0–3.6)
MCH: 37 pg — ABNORMAL HIGH (ref 26.0–34.0)
MCHC: 34.3 g/dL (ref 32.0–36.0)
MCV: 108 fL — AB (ref 80–100)
MONOS PCT: 14.8 %
Monocyte #: 1 x10 3/mm (ref 0.2–1.0)
NEUTROS ABS: 4.5 10*3/uL (ref 1.4–6.5)
NEUTROS PCT: 69.6 %
Platelet: 156 10*3/uL (ref 150–440)
RBC: 2.68 10*6/uL — AB (ref 4.40–5.90)
RDW: 14.2 % (ref 11.5–14.5)
WBC: 6.5 10*3/uL (ref 3.8–10.6)

## 2013-05-14 LAB — BASIC METABOLIC PANEL
ANION GAP: 7 (ref 7–16)
BUN: 22 mg/dL — ABNORMAL HIGH (ref 7–18)
CO2: 34 mmol/L — AB (ref 21–32)
CREATININE: 5.22 mg/dL — AB (ref 0.60–1.30)
Calcium, Total: 9.3 mg/dL (ref 8.5–10.1)
Chloride: 99 mmol/L (ref 98–107)
EGFR (African American): 11 — ABNORMAL LOW
GFR CALC NON AF AMER: 9 — AB
Glucose: 61 mg/dL — ABNORMAL LOW (ref 65–99)
Osmolality: 281 (ref 275–301)
POTASSIUM: 4.3 mmol/L (ref 3.5–5.1)
SODIUM: 140 mmol/L (ref 136–145)

## 2013-09-17 ENCOUNTER — Ambulatory Visit: Payer: Self-pay | Admitting: Otolaryngology

## 2013-10-13 ENCOUNTER — Ambulatory Visit: Payer: Self-pay | Admitting: Gastroenterology

## 2013-10-29 ENCOUNTER — Ambulatory Visit: Payer: Self-pay | Admitting: Vascular Surgery

## 2013-12-19 ENCOUNTER — Emergency Department: Payer: Self-pay | Admitting: Emergency Medicine

## 2013-12-22 ENCOUNTER — Emergency Department: Payer: Self-pay | Admitting: Emergency Medicine

## 2013-12-26 ENCOUNTER — Emergency Department: Payer: Self-pay | Admitting: Internal Medicine

## 2014-01-02 ENCOUNTER — Emergency Department: Payer: Self-pay | Admitting: Internal Medicine

## 2014-03-25 ENCOUNTER — Inpatient Hospital Stay: Payer: Self-pay

## 2014-03-25 ENCOUNTER — Ambulatory Visit: Payer: Self-pay | Admitting: Emergency Medicine

## 2014-03-25 LAB — COMPREHENSIVE METABOLIC PANEL
AST: 18 U/L (ref 15–37)
Albumin: 3 g/dL — ABNORMAL LOW (ref 3.4–5.0)
Alkaline Phosphatase: 62 U/L
Anion Gap: 7 (ref 7–16)
BILIRUBIN TOTAL: 0.5 mg/dL (ref 0.2–1.0)
BUN: 31 mg/dL — AB (ref 7–18)
CHLORIDE: 99 mmol/L (ref 98–107)
CO2: 31 mmol/L (ref 21–32)
Calcium, Total: 8.9 mg/dL (ref 8.5–10.1)
Creatinine: 6.97 mg/dL — ABNORMAL HIGH (ref 0.60–1.30)
EGFR (African American): 10 — ABNORMAL LOW
GFR CALC NON AF AMER: 8 — AB
GLUCOSE: 171 mg/dL — AB (ref 65–99)
Osmolality: 284 (ref 275–301)
Potassium: 5.3 mmol/L — ABNORMAL HIGH (ref 3.5–5.1)
SGPT (ALT): 14 U/L
SODIUM: 137 mmol/L (ref 136–145)
TOTAL PROTEIN: 6.4 g/dL (ref 6.4–8.2)

## 2014-03-25 LAB — CBC
HCT: 33.6 % — AB (ref 40.0–52.0)
HGB: 11 g/dL — ABNORMAL LOW (ref 13.0–18.0)
MCH: 36.1 pg — ABNORMAL HIGH (ref 26.0–34.0)
MCHC: 32.8 g/dL (ref 32.0–36.0)
MCV: 110 fL — ABNORMAL HIGH (ref 80–100)
Platelet: 151 10*3/uL (ref 150–440)
RBC: 3.05 10*6/uL — ABNORMAL LOW (ref 4.40–5.90)
RDW: 14.2 % (ref 11.5–14.5)
WBC: 11.4 10*3/uL — AB (ref 3.8–10.6)

## 2014-03-25 LAB — CK TOTAL AND CKMB (NOT AT ARMC)
CK, Total: 23 U/L — ABNORMAL LOW (ref 39–308)
CK-MB: 1.2 ng/mL (ref 0.5–3.6)

## 2014-03-25 LAB — TROPONIN I
Troponin-I: <0.02 ng/mL
Troponin-I: <0.02 ng/mL

## 2014-03-26 LAB — CBC WITH DIFFERENTIAL/PLATELET
BASOS PCT: 0.7 %
Basophil #: 0.1 10*3/uL (ref 0.0–0.1)
EOS ABS: 0.1 10*3/uL (ref 0.0–0.7)
EOS PCT: 0.8 %
HCT: 31 % — ABNORMAL LOW (ref 40.0–52.0)
HGB: 10.3 g/dL — AB (ref 13.0–18.0)
LYMPHS ABS: 1.3 10*3/uL (ref 1.0–3.6)
LYMPHS PCT: 13.4 %
MCH: 36.4 pg — ABNORMAL HIGH (ref 26.0–34.0)
MCHC: 33.3 g/dL (ref 32.0–36.0)
MCV: 109 fL — ABNORMAL HIGH (ref 80–100)
MONO ABS: 0.9 x10 3/mm (ref 0.2–1.0)
MONOS PCT: 9.6 %
NEUTROS ABS: 7.4 10*3/uL — AB (ref 1.4–6.5)
Neutrophil %: 75.5 %
Platelet: 149 10*3/uL — ABNORMAL LOW (ref 150–440)
RBC: 2.84 10*6/uL — ABNORMAL LOW (ref 4.40–5.90)
RDW: 14.5 % (ref 11.5–14.5)
WBC: 9.8 10*3/uL (ref 3.8–10.6)

## 2014-03-26 LAB — BASIC METABOLIC PANEL
ANION GAP: 7 (ref 7–16)
BUN: 38 mg/dL — AB (ref 7–18)
CREATININE: 7.79 mg/dL — AB (ref 0.60–1.30)
Calcium, Total: 8.9 mg/dL (ref 8.5–10.1)
Chloride: 97 mmol/L — ABNORMAL LOW (ref 98–107)
Co2: 30 mmol/L (ref 21–32)
EGFR (African American): 8 — ABNORMAL LOW
EGFR (Non-African Amer.): 7 — ABNORMAL LOW
Glucose: 129 mg/dL — ABNORMAL HIGH (ref 65–99)
Osmolality: 279 (ref 275–301)
Potassium: 5.9 mmol/L — ABNORMAL HIGH (ref 3.5–5.1)
Sodium: 134 mmol/L — ABNORMAL LOW (ref 136–145)

## 2014-03-26 LAB — CK-MB
CK-MB: 0.8 ng/mL (ref 0.5–3.6)
CK-MB: 1.2 ng/mL (ref 0.5–3.6)
CK-MB: 1.1 ng/mL (ref 0.5–3.6)

## 2014-03-26 LAB — PHOSPHORUS: Phosphorus: 4.3 mg/dL (ref 2.5–4.9)

## 2014-03-26 LAB — TROPONIN I: Troponin-I: <0.02 ng/mL

## 2014-03-27 LAB — CBC WITH DIFFERENTIAL/PLATELET
BASOS PCT: 0.8 %
Basophil #: 0.1 10*3/uL (ref 0.0–0.1)
EOS ABS: 0.2 10*3/uL (ref 0.0–0.7)
EOS PCT: 3.2 %
HCT: 33.1 % — AB (ref 40.0–52.0)
HGB: 11 g/dL — AB (ref 13.0–18.0)
Lymphocyte #: 1.3 10*3/uL (ref 1.0–3.6)
Lymphocyte %: 19.7 %
MCH: 35.9 pg — ABNORMAL HIGH (ref 26.0–34.0)
MCHC: 33.1 g/dL (ref 32.0–36.0)
MCV: 108 fL — ABNORMAL HIGH (ref 80–100)
MONOS PCT: 9.5 %
Monocyte #: 0.6 x10 3/mm (ref 0.2–1.0)
Neutrophil #: 4.5 10*3/uL (ref 1.4–6.5)
Neutrophil %: 66.8 %
PLATELETS: 155 10*3/uL (ref 150–440)
RBC: 3.05 10*6/uL — AB (ref 4.40–5.90)
RDW: 14.5 % (ref 11.5–14.5)
WBC: 6.8 10*3/uL (ref 3.8–10.6)

## 2014-03-27 LAB — COMPREHENSIVE METABOLIC PANEL
ALBUMIN: 2.9 g/dL — AB (ref 3.4–5.0)
ALK PHOS: 55 U/L
ANION GAP: 7 (ref 7–16)
AST: 11 U/L — AB (ref 15–37)
BUN: 18 mg/dL (ref 7–18)
Bilirubin,Total: 0.5 mg/dL (ref 0.2–1.0)
Calcium, Total: 9 mg/dL (ref 8.5–10.1)
Chloride: 95 mmol/L — ABNORMAL LOW (ref 98–107)
Co2: 33 mmol/L — ABNORMAL HIGH (ref 21–32)
Creatinine: 4.88 mg/dL — ABNORMAL HIGH (ref 0.60–1.30)
EGFR (African American): 15 — ABNORMAL LOW
GFR CALC NON AF AMER: 12 — AB
Glucose: 96 mg/dL (ref 65–99)
Osmolality: 272 (ref 275–301)
Potassium: 4.2 mmol/L (ref 3.5–5.1)
SGPT (ALT): 13 U/L — ABNORMAL LOW
SODIUM: 135 mmol/L — AB (ref 136–145)
TOTAL PROTEIN: 6.3 g/dL — AB (ref 6.4–8.2)

## 2014-03-30 ENCOUNTER — Inpatient Hospital Stay: Payer: Self-pay | Admitting: Internal Medicine

## 2014-03-30 LAB — POTASSIUM: Potassium: 5.1 mmol/L (ref 3.5–5.1)

## 2014-03-31 LAB — BASIC METABOLIC PANEL
ANION GAP: 9 (ref 7–16)
BUN: 38 mg/dL — ABNORMAL HIGH (ref 7–18)
CO2: 31 mmol/L (ref 21–32)
Calcium, Total: 9.1 mg/dL (ref 8.5–10.1)
Chloride: 97 mmol/L — ABNORMAL LOW (ref 98–107)
Creatinine: 7.52 mg/dL — ABNORMAL HIGH (ref 0.60–1.30)
EGFR (Non-African Amer.): 7 — ABNORMAL LOW
GFR CALC AF AMER: 9 — AB
Glucose: 123 mg/dL — ABNORMAL HIGH (ref 65–99)
OSMOLALITY: 284 (ref 275–301)
POTASSIUM: 4.9 mmol/L (ref 3.5–5.1)
Sodium: 137 mmol/L (ref 136–145)

## 2014-03-31 LAB — CBC WITH DIFFERENTIAL/PLATELET
Basophil #: 0.1 10*3/uL (ref 0.0–0.1)
Basophil %: 0.9 %
EOS ABS: 0.3 10*3/uL (ref 0.0–0.7)
Eosinophil %: 4.3 %
HCT: 30.5 % — AB (ref 40.0–52.0)
HGB: 10 g/dL — ABNORMAL LOW (ref 13.0–18.0)
Lymphocyte #: 1.2 10*3/uL (ref 1.0–3.6)
Lymphocyte %: 18.6 %
MCH: 35.9 pg — ABNORMAL HIGH (ref 26.0–34.0)
MCHC: 32.8 g/dL (ref 32.0–36.0)
MCV: 110 fL — AB (ref 80–100)
Monocyte #: 0.6 x10 3/mm (ref 0.2–1.0)
Monocyte %: 9.1 %
NEUTROS PCT: 67.1 %
Neutrophil #: 4.4 10*3/uL (ref 1.4–6.5)
PLATELETS: 164 10*3/uL (ref 150–440)
RBC: 2.78 10*6/uL — AB (ref 4.40–5.90)
RDW: 14.1 % (ref 11.5–14.5)
WBC: 6.6 10*3/uL (ref 3.8–10.6)

## 2014-03-31 LAB — PHOSPHORUS: Phosphorus: 6 mg/dL — ABNORMAL HIGH (ref 2.5–4.9)

## 2014-04-12 LAB — CBC WITH DIFFERENTIAL/PLATELET
Basophil #: 0 10*3/uL (ref 0.0–0.1)
Basophil %: 0.5 %
EOS PCT: 1.1 %
Eosinophil #: 0.1 10*3/uL (ref 0.0–0.7)
HCT: 33.4 % — ABNORMAL LOW (ref 40.0–52.0)
HGB: 10.8 g/dL — ABNORMAL LOW (ref 13.0–18.0)
LYMPHS ABS: 1.1 10*3/uL (ref 1.0–3.6)
LYMPHS PCT: 14 %
MCH: 35.5 pg — ABNORMAL HIGH (ref 26.0–34.0)
MCHC: 32.3 g/dL (ref 32.0–36.0)
MCV: 110 fL — ABNORMAL HIGH (ref 80–100)
Monocyte #: 0.7 x10 3/mm (ref 0.2–1.0)
Monocyte %: 9.7 %
NEUTROS ABS: 5.6 10*3/uL (ref 1.4–6.5)
NEUTROS PCT: 74.7 %
Platelet: 141 10*3/uL — ABNORMAL LOW (ref 150–440)
RBC: 3.04 10*6/uL — AB (ref 4.40–5.90)
RDW: 14.8 % — ABNORMAL HIGH (ref 11.5–14.5)
WBC: 7.5 10*3/uL (ref 3.8–10.6)

## 2014-04-12 LAB — CBC
HCT: 34.6 % — ABNORMAL LOW (ref 40.0–52.0)
HGB: 11.2 g/dL — ABNORMAL LOW (ref 13.0–18.0)
MCH: 35.5 pg — ABNORMAL HIGH (ref 26.0–34.0)
MCHC: 32.4 g/dL (ref 32.0–36.0)
MCV: 110 fL — ABNORMAL HIGH (ref 80–100)
Platelet: 153 10*3/uL (ref 150–440)
RBC: 3.16 10*6/uL — ABNORMAL LOW (ref 4.40–5.90)
RDW: 14.6 % — ABNORMAL HIGH (ref 11.5–14.5)
WBC: 7 10*3/uL (ref 3.8–10.6)

## 2014-04-12 LAB — COMPREHENSIVE METABOLIC PANEL
ALBUMIN: 3.2 g/dL — AB (ref 3.4–5.0)
ALT: 17 U/L
AST: 26 U/L (ref 15–37)
Albumin: 3.3 g/dL — ABNORMAL LOW (ref 3.4–5.0)
Alkaline Phosphatase: 61 U/L
Alkaline Phosphatase: 61 U/L
Anion Gap: 11 (ref 7–16)
Anion Gap: 13 (ref 7–16)
BILIRUBIN TOTAL: 0.4 mg/dL (ref 0.2–1.0)
BUN: 48 mg/dL — AB (ref 7–18)
BUN: 53 mg/dL — ABNORMAL HIGH (ref 7–18)
Bilirubin,Total: 0.4 mg/dL (ref 0.2–1.0)
CALCIUM: 8.8 mg/dL (ref 8.5–10.1)
CHLORIDE: 102 mmol/L (ref 98–107)
CREATININE: 9.71 mg/dL — AB (ref 0.60–1.30)
Calcium, Total: 9.1 mg/dL (ref 8.5–10.1)
Chloride: 102 mmol/L (ref 98–107)
Co2: 22 mmol/L (ref 21–32)
Co2: 21 mmol/L (ref 21–32)
Creatinine: 9.23 mg/dL — ABNORMAL HIGH (ref 0.60–1.30)
EGFR (Non-African Amer.): 6 — ABNORMAL LOW
EGFR (Non-African Amer.): 5 — ABNORMAL LOW
GFR CALC AF AMER: 7 — AB
GFR CALC AF AMER: 7 — AB
GLUCOSE: 129 mg/dL — AB (ref 65–99)
Glucose: 92 mg/dL (ref 65–99)
Osmolality: 284 (ref 275–301)
Osmolality: 286 (ref 275–301)
Potassium: 5.3 mmol/L — ABNORMAL HIGH (ref 3.5–5.1)
Potassium: 4.6 mmol/L (ref 3.5–5.1)
SGOT(AST): 24 U/L (ref 15–37)
SGPT (ALT): 17 U/L
SODIUM: 135 mmol/L — AB (ref 136–145)
SODIUM: 136 mmol/L (ref 136–145)
Total Protein: 6.7 g/dL (ref 6.4–8.2)
Total Protein: 6.6 g/dL (ref 6.4–8.2)

## 2014-04-12 LAB — PHOSPHORUS: Phosphorus: 3.4 mg/dL (ref 2.5–4.9)

## 2014-04-12 LAB — TSH: THYROID STIMULATING HORM: 4.23 u[IU]/mL

## 2014-04-12 LAB — CLOSTRIDIUM DIFFICILE(ARMC)

## 2014-04-14 ENCOUNTER — Inpatient Hospital Stay: Payer: Self-pay | Admitting: Family Medicine

## 2014-04-14 LAB — RENAL FUNCTION PANEL
Albumin: 2.8 g/dL — ABNORMAL LOW (ref 3.4–5.0)
Anion Gap: 9 (ref 7–16)
BUN: 26 mg/dL — AB (ref 7–18)
CALCIUM: 8.3 mg/dL — AB (ref 8.5–10.1)
Chloride: 105 mmol/L (ref 98–107)
Co2: 25 mmol/L (ref 21–32)
Creatinine: 7.18 mg/dL — ABNORMAL HIGH (ref 0.60–1.30)
GFR CALC AF AMER: 9 — AB
GFR CALC NON AF AMER: 8 — AB
Glucose: 116 mg/dL — ABNORMAL HIGH (ref 65–99)
OSMOLALITY: 283 (ref 275–301)
POTASSIUM: 4.1 mmol/L (ref 3.5–5.1)
Phosphorus: 2.1 mg/dL — ABNORMAL LOW (ref 2.5–4.9)
SODIUM: 139 mmol/L (ref 136–145)

## 2014-04-14 LAB — STOOL CULTURE

## 2014-04-14 LAB — WBCS, STOOL

## 2014-04-15 LAB — CBC WITH DIFFERENTIAL/PLATELET
BASOS ABS: 0.1 10*3/uL (ref 0.0–0.1)
BASOS PCT: 0.9 %
EOS ABS: 0.2 10*3/uL (ref 0.0–0.7)
EOS PCT: 3 %
HCT: 29.3 % — AB (ref 40.0–52.0)
HGB: 9.8 g/dL — ABNORMAL LOW (ref 13.0–18.0)
Lymphocyte #: 1.4 10*3/uL (ref 1.0–3.6)
Lymphocyte %: 22.6 %
MCH: 35.9 pg — AB (ref 26.0–34.0)
MCHC: 33.4 g/dL (ref 32.0–36.0)
MCV: 108 fL — AB (ref 80–100)
Monocyte #: 0.8 x10 3/mm (ref 0.2–1.0)
Monocyte %: 12.1 %
NEUTROS ABS: 3.8 10*3/uL (ref 1.4–6.5)
NEUTROS PCT: 61.4 %
Platelet: 126 10*3/uL — ABNORMAL LOW (ref 150–440)
RBC: 2.72 10*6/uL — AB (ref 4.40–5.90)
RDW: 14.4 % (ref 11.5–14.5)
WBC: 6.3 10*3/uL (ref 3.8–10.6)

## 2014-04-15 LAB — BASIC METABOLIC PANEL
Anion Gap: 7 (ref 7–16)
BUN: 16 mg/dL (ref 7–18)
CALCIUM: 8.2 mg/dL — AB (ref 8.5–10.1)
CO2: 29 mmol/L (ref 21–32)
CREATININE: 4.92 mg/dL — AB (ref 0.60–1.30)
Chloride: 103 mmol/L (ref 98–107)
EGFR (African American): 14 — ABNORMAL LOW
GFR CALC NON AF AMER: 12 — AB
Glucose: 140 mg/dL — ABNORMAL HIGH (ref 65–99)
Osmolality: 281 (ref 275–301)
Potassium: 3.6 mmol/L (ref 3.5–5.1)
Sodium: 139 mmol/L (ref 136–145)

## 2014-04-16 LAB — RENAL FUNCTION PANEL
Albumin: 2.7 g/dL — ABNORMAL LOW (ref 3.4–5.0)
Anion Gap: 10 (ref 7–16)
BUN: 25 mg/dL — AB (ref 7–18)
CALCIUM: 8.3 mg/dL — AB (ref 8.5–10.1)
CHLORIDE: 105 mmol/L (ref 98–107)
CREATININE: 6.93 mg/dL — AB (ref 0.60–1.30)
Co2: 26 mmol/L (ref 21–32)
GFR CALC AF AMER: 10 — AB
GFR CALC NON AF AMER: 8 — AB
Glucose: 119 mg/dL — ABNORMAL HIGH (ref 65–99)
Osmolality: 287 (ref 275–301)
Phosphorus: 2.9 mg/dL (ref 2.5–4.9)
Potassium: 3.9 mmol/L (ref 3.5–5.1)
Sodium: 141 mmol/L (ref 136–145)

## 2014-04-16 LAB — STOOL CULTURE

## 2014-06-21 ENCOUNTER — Inpatient Hospital Stay: Payer: Self-pay | Admitting: Family Medicine

## 2014-08-14 ENCOUNTER — Inpatient Hospital Stay
Admit: 2014-08-14 | Disposition: A | Discharge status: Skilled Nursing Facility | Payer: Self-pay | Attending: Internal Medicine | Admitting: Internal Medicine

## 2014-08-14 LAB — HEPATIC FUNCTION PANEL A (ARMC)
Albumin: 3.1 g/dL — ABNORMAL LOW
Alkaline Phosphatase: 67 U/L
BILIRUBIN TOTAL: 0.3 mg/dL
SGOT(AST): 19 U/L
SGPT (ALT): 8 U/L — ABNORMAL LOW
Total Protein: 5.9 g/dL — ABNORMAL LOW

## 2014-08-14 LAB — BASIC METABOLIC PANEL
Anion Gap: 12 (ref 7–16)
BUN: 18 mg/dL
CHLORIDE: 94 mmol/L — AB
CREATININE: 5.17 mg/dL — AB
Calcium, Total: 9.1 mg/dL
Co2: 31 mmol/L
EGFR (African American): 10 — ABNORMAL LOW
GFR CALC NON AF AMER: 9 — AB
GLUCOSE: 193 mg/dL — AB
Potassium: 3.8 mmol/L
SODIUM: 137 mmol/L

## 2014-08-14 LAB — TROPONIN I: Troponin-I: 0.03 ng/mL

## 2014-08-14 LAB — CBC
HCT: 32 % — ABNORMAL LOW (ref 40.0–52.0)
HGB: 10.5 g/dL — ABNORMAL LOW (ref 13.0–18.0)
MCH: 35.3 pg — ABNORMAL HIGH (ref 26.0–34.0)
MCHC: 33 g/dL (ref 32.0–36.0)
MCV: 107 fL — ABNORMAL HIGH (ref 80–100)
PLATELETS: 143 10*3/uL — AB (ref 150–440)
RBC: 2.99 10*6/uL — ABNORMAL LOW (ref 4.40–5.90)
RDW: 15.5 % — ABNORMAL HIGH (ref 11.5–14.5)
WBC: 8.2 10*3/uL (ref 3.8–10.6)

## 2014-08-15 LAB — CBC WITH DIFFERENTIAL/PLATELET
BASOS ABS: 0.1 10*3/uL (ref 0.0–0.1)
BASOS PCT: 0.9 %
Eosinophil #: 0.2 10*3/uL (ref 0.0–0.7)
Eosinophil %: 3.1 %
HCT: 30.9 % — AB (ref 40.0–52.0)
HGB: 10 g/dL — ABNORMAL LOW (ref 13.0–18.0)
Lymphocyte #: 1.4 10*3/uL (ref 1.0–3.6)
Lymphocyte %: 20.6 %
MCH: 34.7 pg — ABNORMAL HIGH (ref 26.0–34.0)
MCHC: 32.3 g/dL (ref 32.0–36.0)
MCV: 108 fL — ABNORMAL HIGH (ref 80–100)
MONOS PCT: 8.5 %
Monocyte #: 0.6 x10 3/mm (ref 0.2–1.0)
NEUTROS ABS: 4.5 10*3/uL (ref 1.4–6.5)
NEUTROS PCT: 66.9 %
Platelet: 127 10*3/uL — ABNORMAL LOW (ref 150–440)
RBC: 2.87 10*6/uL — ABNORMAL LOW (ref 4.40–5.90)
RDW: 15.5 % — ABNORMAL HIGH (ref 11.5–14.5)
WBC: 6.7 10*3/uL (ref 3.8–10.6)

## 2014-08-15 LAB — TROPONIN I
Troponin-I: 0.03 ng/mL
Troponin-I: 0.04 ng/mL — ABNORMAL HIGH

## 2014-08-15 LAB — CLOSTRIDIUM DIFFICILE(ARMC)

## 2014-08-15 LAB — TSH: Thyroid Stimulating Horm: 0.493 u[IU]/mL

## 2014-08-16 DIAGNOSIS — I4430 Unspecified atrioventricular block: Secondary | ICD-10-CM | POA: Diagnosis not present

## 2014-08-16 LAB — RENAL FUNCTION PANEL
Albumin: 2.8 g/dL — ABNORMAL LOW
Anion Gap: 11 (ref 7–16)
BUN: 33 mg/dL — ABNORMAL HIGH
CALCIUM: 8.6 mg/dL — AB
CREATININE: 7.4 mg/dL — AB
Chloride: 95 mmol/L — ABNORMAL LOW
Co2: 31 mmol/L
EGFR (African American): 7 — ABNORMAL LOW
EGFR (Non-African Amer.): 6 — ABNORMAL LOW
Glucose: 241 mg/dL — ABNORMAL HIGH
POTASSIUM: 4.2 mmol/L
Phosphorus: 3.8 mg/dL
Sodium: 137 mmol/L

## 2014-08-16 LAB — CBC WITH DIFFERENTIAL/PLATELET
Basophil #: 0.1 10*3/uL (ref 0.0–0.1)
Basophil %: 1.1 %
EOS ABS: 0.2 10*3/uL (ref 0.0–0.7)
EOS PCT: 3.8 %
HCT: 30.9 % — ABNORMAL LOW (ref 40.0–52.0)
HGB: 10.1 g/dL — ABNORMAL LOW (ref 13.0–18.0)
LYMPHS ABS: 1.4 10*3/uL (ref 1.0–3.6)
Lymphocyte %: 21.7 %
MCH: 35 pg — AB (ref 26.0–34.0)
MCHC: 32.7 g/dL (ref 32.0–36.0)
MCV: 107 fL — AB (ref 80–100)
Monocyte #: 0.5 x10 3/mm (ref 0.2–1.0)
Monocyte %: 7.2 %
Neutrophil #: 4.2 10*3/uL (ref 1.4–6.5)
Neutrophil %: 66.2 %
Platelet: 136 10*3/uL — ABNORMAL LOW (ref 150–440)
RBC: 2.89 10*6/uL — AB (ref 4.40–5.90)
RDW: 15.3 % — AB (ref 11.5–14.5)
WBC: 6.3 10*3/uL (ref 3.8–10.6)

## 2014-08-16 LAB — FOLATE: Folic Acid: 26 ng/mL

## 2014-08-17 LAB — CBC WITH DIFFERENTIAL/PLATELET
BASOS PCT: 0.9 %
Basophil #: 0.1 10*3/uL (ref 0.0–0.1)
EOS ABS: 0.1 10*3/uL (ref 0.0–0.7)
Eosinophil %: 1.7 %
HCT: 33.3 % — ABNORMAL LOW (ref 40.0–52.0)
HGB: 10.9 g/dL — AB (ref 13.0–18.0)
Lymphocyte #: 1.1 10*3/uL (ref 1.0–3.6)
Lymphocyte %: 15.8 %
MCH: 35 pg — AB (ref 26.0–34.0)
MCHC: 32.6 g/dL (ref 32.0–36.0)
MCV: 107 fL — ABNORMAL HIGH (ref 80–100)
MONO ABS: 0.8 x10 3/mm (ref 0.2–1.0)
Monocyte %: 11.6 %
Neutrophil #: 4.9 10*3/uL (ref 1.4–6.5)
Neutrophil %: 70 %
Platelet: 140 10*3/uL — ABNORMAL LOW (ref 150–440)
RBC: 3.1 10*6/uL — AB (ref 4.40–5.90)
RDW: 15.4 % — ABNORMAL HIGH (ref 11.5–14.5)
WBC: 7 10*3/uL (ref 3.8–10.6)

## 2014-08-17 LAB — APTT: Activated PTT: 33.8 secs (ref 23.6–35.9)

## 2014-08-17 LAB — PROTIME-INR
INR: 1
Prothrombin Time: 12.9 secs

## 2014-08-17 LAB — FOLATE: Folic Acid: 17.2 ng/mL

## 2014-08-17 NOTE — Op Note (Signed)
PATIENT NAME:  Johnathan LabellaSAUL, Jaymir D MR#:  [id number] DATE OF BIRTH:  1923-04-28  DATE OF PROCEDURE:  02/12/2012  PREOPERATIVE DIAGNOSES:  1. Complication of AV dialysis fistula, left wrist. 2. End-stage renal disease requiring hemodialysis.  3. Hyperkalemia.   POSTOPERATIVE DIAGNOSES:  1. Complication of AV dialysis fistula, left wrist.  2. End-stage renal disease requiring hemodialysis.  3. Hyperkalemia.   PROCEDURES PERFORMED:  1. Contrast injection of left wrist fistula.  2. Percutaneous transluminal angioplasty to 8 mm, arterial anastomosis, left wrist fistula.   SURGEON: Levora DredgeGregory Greogory Cornette, MD  SEDATION: Versed 3 mg plus fentanyl 100 mcg administered IV. Continuous ECG, pulse oximetry and cardiopulmonary monitoring was performed throughout the entire procedure by the interventional radiology nurse. Total sedation time was 45 minutes.   ACCESS: 6 French sheath, retrograde direction, left wrist fistula.   CONTRAST USED: Isovue 40 mL.   FLUOROSCOPY TIME: 8.1 minutes.   INDICATIONS: [name] is an 81 year old gentleman who is maintained on hemodialysis via a left wrist radiocephalic fistula. Recently they have been having very poor flows and the efficiency of his dialysis has deteriorated significantly. His potassium is poorly controlled as well. Duplex ultrasound demonstrated high-grade stenosis near the arterial anastomosis and he is undergoing contrast injection and subsequent treatment if feasible. The risks and benefits were reviewed, all questions answered, and the patient agrees to proceed.   DESCRIPTION OF PROCEDURE: The patient is taken to special procedures and placed in the supine position with his left arm extended palm upward and, after adequate sedation has been achieved, the left arm is prepped and draped in sterile fashion. By palpation the fistula is localized near the antecubital fossa and in a retrograde direction a microneedle is inserted, microwire followed microsheath, J-wire  followed by a 6 [place] sheath. Using a combination of a KMP catheter and a floppy Glidewire, the catheter/wire combination is negotiated through the aneurysmal segments of the fistula and into the radial artery. Wire is removed and the catheter is then utilized for hand injection of contrast. Greater than 80% stenosis of the vein, at the level of the anastomosis, is identified and 3000 units of heparin is given.   The Magic torque wire is then reintroduced and a 6 x 4 balloon is used to angioplasty this lesion. Follow-up angiography through the KMP catheter demonstrates minimal if any change. Therefore, a 7 x 4 balloon is utilized. Follow-up angiography demonstrated some modest improvement, but there is significant residual stenosis and therefore an 8 x 4 balloon is advanced across this stricture, inflated to 15 atmospheres for one minute, and follow-up angiography demonstrates marked improvement in the flow through the fistula and there is a small amount of extravasation, but this is quite minimal. By palpation the fistula itself now has a very easy to feel thrill and is significantly improved over the previous exam. Hand injection of contrast through the 6 French sheath is then utilized to demonstrate the fistula, in the more proximal forearm, upper arm and central venous anatomy as well. After review of the images, pursestring suture of 4-0 Monocryl is placed around the sheath, the sheath is removed, suture is secured and light pressure is held. There are no immediate complications.   INTERPRETATION: There are two large aneurysmal segments of the fistula. The skin overlying these segments appears to be healthy and intact. There is a high-grade greater than 75% stenosis at the level of the arterial anastomosis and the cephalic vein thus creating profound low flow state within the fistula. More proximally  the fistula is well collateralized filling both basilic and brachial veins. The subclavian vein is widely  patent. The innominate vein demonstrates a fairly high-grade stricture with significant reflux of contrast into the jugular vein. This is preexisting and has been occult and therefore will not be treated today, however, with improvement in the overall flow, if he develops significant arm swelling, certainly addressing the central venous lesion would be helpful.         Following angioplasty serially from 6 to 7 to 8 mm, there is resolution of the stenosis and improvement of the flow.   SUMMARY: Successful salvage of left radiocephalic fistula, as described above. ____________________________ Renford Dills, MD ggs:slb D: 02/12/2012 16:11:53 ET T: 02/12/2012 17:29:12 ET JOB#: [id number]  cc: Renford Dills, MD, <Dictator> Marisue Ivan, MD Renford Dills MD ELECTRONICALLY SIGNED 02/13/2012 12:34

## 2014-08-18 LAB — HEMOGLOBIN: HGB: 10.7 g/dL — AB (ref 13.0–18.0)

## 2014-08-18 LAB — PHOSPHORUS: Phosphorus: 3.4 mg/dL

## 2014-08-20 NOTE — Op Note (Signed)
PATIENT NAME:  [name], [name] MR#:  [id number] DATE OF BIRTH:  12-Apr-1923  DATE OF PROCEDURE:  08/07/2012  PREOPERATIVE DIAGNOSES:  1.  End-stage renal disease.  2.  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  POSTOPERATIVE DIAGNOSES:  1.  End-stage renal disease.  2.  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  PROCEDURE: Revision of left radiocephalic AV fistula with immediate stick PTFE jump graft and ligation of aneurysm.   SURGEON: Annice Needy, M.D.   ANESTHESIA: General.   ESTIMATED BLOOD LOSS: Approximately 25 mL.   INDICATION FOR PROCEDURE: This is a gentleman with a long-standing left radiocephalic AV fistula. It has developed a very large aneurysm and there is now skin threat with scabs and impending ulcerations on the skin. He also has massive left arm and hand swelling in the forearm. We are revising this to avoid bleeding complications from the skin threat and to hopefully improve the swelling in his arm. Risks and benefits were discussed. Informed consent was obtained.  DESCRIPTION OF PROCEDURE: The patient was brought to the operative suite and after an adequate level of general anesthesia was obtained, the left upper extremity was sterilely prepped and draped and a sterile surgical field was created. An incision was created just around the original anastomosis site of the radiocephalic AV fistula. The area was dissected out. There was a large cephalic vein branch that had to be controlled, and then the large aneurysmal part of the fistula was just beyond this. Vesseloops were placed in these locations to later allow clamping. I then made an incision just below the antecubital fossa beyond the aneurysmal part of the fistula with skin threat and dissected out the fistulous location. Several branches were ligated and divided between silk ties. I then used the most curved tunnel and tunneled in the arm  around the lateral portion of the arm to bridge the 2 incisions. The patient was given 3000 units of intravenous heparin for systemic anticoagulation and a profunda clamp was used to clamp proximally on the fistula. Clamps were used to take the aneurysmal portion of the fistula and the large cephalic vein branch, and then these were oversewn with 5-0 Prolene sutures in both locations. This would exclude the aneurysm from flow. An anastomosis was created with a parachuting end-to-end fashion with a CV-5 suture to the Acuseal graft. At this point, we flushed the graft with excellent pulsatile inflow. The vein distally was controlled with bulldog clamps, and the anterior wall was opened with a #11 blade and extended with Potts scissors. The graft was cut and beveled to appropriate length to match the venotomy, and anastomosis was created with a running CV-6 suture. Two CV-6 patch sutures were used for hemostasis, and I flushed the vessel prior to release of control. I then ligated the vein behind the anastomosis to avoid backfilling into the aneurysm as well. This was done with a heavy silk suture. The graft had excellent pulsatile flow and at this point, I elected to terminate the procedure. The wounds were irrigated. Surgicel and Evicyl topical hemostatic agents were placed and hemostasis was complete. The wound was then closed with a running 3-0 Vicryl and a 4-0 Monocryl. Dermabond was placed as a dressing. The patient tolerated the procedure well and was taken to the recovery room in stable condition.    ____________________________ Annice Needy, MD jsd:jm D: 08/07/2012 16:36:06 ET T: 08/07/2012 17:57:42 ET JOB#: [id number]  cc: Annice NeedyJason S. Dessirae Scarola, MD, <Dictator> Annice NeedyJASON S Christopher Glasscock MD ELECTRONICALLY SIGNED 08/09/2012 10:07

## 2014-08-20 NOTE — Consult Note (Signed)
Brief Consult Note: Diagnosis: Acute Respiratory Distress.   Comments: Patient with worsening SOB, fluid overload secondary to CHF in patient with ESRD on hemodialysis. Patient's last echo done 07/08/12 with mildly dilated R and L atria, borderline LVEF52%, mild diastolic dysfunction, moderate LVH, mod to severe MR, moderate TR, mild pulm HTN. BP in good control at this time and patient greatly improved after HD. Pt on BB, Lasix, cannot take ACE-I. Follow ins and outs. Appreciate nephrology input.  Electronic Signatures: Radene KneeKhan, Shaukat Ali (MD)   (Signed 07-Apr-14 16:40)  Co-Signer: Brief Consult Note Maia PlanManzi, Milli Woolridge A (PA-C)   (Signed 07-Apr-14 09:02)  Authored: Brief Consult Note  Last Updated: 07-Apr-14 16:40 by Radene KneeKhan, Shaukat Ali (MD)

## 2014-08-20 NOTE — Consult Note (Signed)
Brief Consult Note: Diagnosis: complication of dialysis device.   Patient was seen by consultant.   Comments: will continue with plan for or this thursday.  Electronic Signatures: Levora DredgeSchnier, Jhair Witherington (MD)  (Signed 08-Apr-14 18:24)  Authored: Brief Consult Note   Last Updated: 08-Apr-14 18:24 by Levora DredgeSchnier, Saige Busby (MD)

## 2014-08-20 NOTE — Consult Note (Signed)
PATIENT NAME:  [name], [name] MR#:  [id number] DATE OF BIRTH:  Nov 04, 1922  DATE OF CONSULTATION:  08/04/2012  REFERRING PHYSICIAN:  Alford Highlandichard Wieting, MD CONSULTING PHYSICIAN:  Verta EllenMonica A. Adriahna Shearman, PA-C  PRIMARY CARE PHYSICIAN: Marisue IvanKanhka Linthavong, MD  [facility]:  [facility].  REASON FOR CONSULTATION: Shortness of breath, congestive heart failure.   HISTORY OF PRESENT ILLNESS: [name] is an 81 year old white male who is well known to our practice. He has a history of end-stage renal disease and receives hemodialysis on Monday, Wednesday and Friday. He notes that he has had worsening shortness of breath and yesterday was unable to get out of the car and walk in to church. He tried to lie down at home and use his CPAP machine, but he did not have any improvement in symptoms. He had shortness of breath with coughing productive of white phlegm, orthopnea, nausea and increased pedal and arm edema. Due to these symptoms, he was brought to the Emergency Department where he was found to have elevated blood pressure and an elevated BNP of 41,276, creatinine of 6.76.   The patient notes that over the last several months he has had decreased appetite and has gone from 245 pounds to 216 pounds. Of note, following hemodialysis last night he feels much better and is less short of breath.   PAST MEDICAL HISTORY: 1.  End-stage renal disease, on hemodialysis Monday, Wednesday and Friday.  2.  BPH. 3.  Hypothyroidism.  4.  Diabetes.  5.  Hypertension. 6.  Gout.  7.  Obesity.  8.  Sleep apnea, on CPAP machine.  9.  History of congestive heart failure with last left ventricular ejection fraction on echo 07/08/2012 of 52%, mild diastolic dysfunction, moderate to severe mitral regurgitation.    PAST SURGICAL HISTORY: 1.  Cholecystectomy.  2.  Hernia.  3.  Kidney stone.   ALLERGIES: LISINOPRIL.   HOME MEDICATIONS: 1.  Tylenol 325 mg 2 tablets p.o. b.i.[name].  2.  Albuterol inhalation solution 3 mL twice  daily.  3.  Allopurinol 100 mg p.o. daily.  4.  Aspirin 81 mg p.o. daily.  5.  Calcium acetate 667 mg 2 capsules 3 times daily with meals.  6.  Finasteride 5 mg p.o. at bedtime.  7.  Flunisolide 25 mcg/inhalation, 2 puffs twice daily nasally.  8.  Lasix 40 mg p.o. daily.  9.  Glipizide 5 mg daily.  10.  Lactobacillus probiotics 2 tablets b.i.[name].  11.  Lantus 10 to 12 units subcutaneously injected at bedtime.  12.  Levothyroxine 50 mcg daily.  13.  Lidocaine topical applied to effected area as needed.  14.  Loperamide 2 mg 1 capsule p.o. q. a.m., p.r.n. diarrhea.  15.  Loratadine 1 tablet p.o. daily.  16.  Metoprolol succinate 100 mg p.o. daily.  17.  Omega-3 polyunsaturated fatty acids 1 capsule b.i.[name]. (on hold since 03/27).  18.  Omeprazole 20 mg p.o. b.i.[name].  19.  Renal capsules 1 capsule p.o. daily.  20.  Simvastatin 20 mg 1/2 tablet p.o. at bedtime.   SOCIAL HISTORY: The patient quit smoking over 50 years ago, he denies any alcohol or illicit drug use. He used to work in the [employer] industry.   FAMILY HISTORY: Father died at 8478 of a CVA, he also had diabetes. Mother with diabetes and died at age 79.   REVIEW OF SYSTEMS:  CONSTITUTIONAL: The patient complains of chills, sweats, weight loss.  EYES: The patient wears glasses, but denies any double vision.  EARS, NOSE AND THROAT:  The patient has decreased hearing, allergic rhinitis with runny nose.  CARDIOVASCULAR: The patient denies any chest pain, chest pressure, palpitations or left arm pain.  RESPIRATORY: The patient complains of shortness of breath, cough with white phlegm, no hemoptysis.  GASTROENTEROLOGY: The patient complains of nausea and diarrhea.  MUSCULOSKELETAL: The patient denies any joint pains.  Has had swelling of the legs.    PHYSICAL EXAMINATION: GENERAL: This is a pleasant male, not in any acute distress. He is alert and oriented x 3.  VITAL SIGNS: Temperature 98.6 degrees Fahrenheit, heart rate 106, respiratory  rate 31 and O2 sat is 92% on 3 L/min by nasal cannula.  HEENT: Head atraumatic, normocephalic. The patient wears glasses. No scleral icterus. Conjunctivae are pale, pink. Ears and nose are normal to external inspection. Mouth fair dentition.  Moist mucous membranes.  NECK: Supple. Trachea is midline. Does have some JVD, however, hard to evaluate as the patient is sitting up in a chair.  LUNGS:  Negative accessory muscle use. Diminished breath sounds bilaterally. No adventitious breath sounds can be appreciated, status post dialysis.  HEART:  Regular rate and rhythm. Grade 4/6 systolic ejection murmur heard loudest over the apex.  ABDOMEN: Obese. Bowel sounds are present.  Soft and nontender to palpation.  EXTREMITIES: No clubbing. He has 2 to 3+ pedal edema bilaterally.   LABORATORY AND DIAGNOSTICS: EKG on admission with sinus tachycardia, nonspecific idioventricular block, nonspecific ST changes.   BNP 41,276. Glucose 158, BUN 12, creatinine 3.70 (from 6.76), sodium 138, potassium 4.8, chloride 102, CO2 33. Estimated GFR is 14. LDL is 54, total cholesterol 100, triglycerides 48 and HDL is 36. Total protein is 7.5, albumin 3.6, total bilirubin 0.7, alkaline phosphatase 93, AST 19 and ALT 13. Troponin I 0.02, 0.04 (on second check). White blood cell count 5.1, hemoglobin 11.7, hematocrit 35.3 and platelet count 172,000.   Blood gas on admission with a pH of 7.34, pCO2 46, pO2 135 and bicarbonate of 24.8.   Chest x-ray consistent with pulmonary interstitial edema, increased density in the right infrahilar region suggesting atelectasis versus pneumonia, small amount of pleural fluid suspected at the lung bases bilaterally.   ASSESSMENT/PLAN: 1.  Acute shortness of breath, likely from congestive heart failure. The patient's last echocardiogram done in our office on 07/08/2012 with a borderline normal LV systolic function of 52%, and mild diastolic dysfunction, as well as moderate to severe mitral  regurgitation. The patient has been given beta blockers, a dose of IV Lasix, and hemodialysis, and his shortness of breath and edema have improved. He denies any chest pain at this time and troponins have been negative. Repeat echo has also been ordered. 2.  Hypertension, likely secondary to acute respiratory failure. His blood pressure is in good control at this time.  3.  End-stage renal disease, on hemodialysis. Nephrology has been following.  4.  Hyperlipidemia. The patient is on simvastatin. 5.  Diabetes. Being managed by hospitalist and is on Lantus and glipizide.  6.  Sleep apnea, the patient is currently on CPAP nightly.   Thank you very much for this consultation and allowing [place] to participate in this patient's care. ____________________________ Verta Ellen, PA-C mam:sb [name]: 08/04/2012 09:11:15 ET T: 08/04/2012 09:23:46 ET JOB#: [id number]  cc: Verta Ellen, PA-C, <Dictator> Marisue Ivan, MD [facility] Zayne Draheim A [facility] PA ELECTRONICALLY SIGNED 08/05/2012 8:15

## 2014-08-20 NOTE — Op Note (Signed)
PATIENT NAME:  [name], [name] MR#:  [id number] DATE OF BIRTH:  June 21, 1922  DATE OF PROCEDURE:  06/05/2012  PREOPERATIVE DIAGNOSES: 1.  End-stage renal disease with aneurysmal left arm AV fistula with recent difficulty with access.  2.  Hypertension.  3.  Diabetes.   POSTOPERATIVE DIAGNOSES:  1.  End-stage renal disease with aneurysmal left arm AV fistula with recent difficulty with access.  2.  Hypertension.  3.  Diabetes.   PROCEDURES PERFORMED: 1.  Ultrasound guidance for vascular access to left radiocephalic AV fistula.  2.  Left upper extremity fistulogram and central venogram.   SURGEON: Annice NeedyJason S. Dew, M.[name].   ANESTHESIA:  Local with moderate conscious sedation.   ESTIMATED BLOOD LOSS:  Minimal.   FLUOROSCOPY TIME:  Approximately 1 minute.  CONTRAST USED:  27 mL in the case.   INDICATION FOR PROCEDURE:  This is a gentleman with a long-standing left arm AV fistula. He has an aneurysmal area, as recently he had a difficult access with severe bleeding. We were asked to evaluate the fistula.   DESCRIPTION OF PROCEDURE:  The patient's left upper extremity was sterilely prepped and draped, and a sterile surgical field was created. We accessed the fistula at the beginning of an aneurysmal segment, not too far beyond the anastomosis. This was done without difficulty with a micropuncture needle under direct ultrasound guidance. A permanent image was recorded. Micropuncture wire and sheath were placed. I then up sized to a 6-French sheath. Imaging was then performed. This demonstrated a patent AV fistula without areas of significant stenosis identified. There were 2 aneurysmal areas. These areas were large. The fistula itself was quite large, and this would not be amenable to stent placement for exclusion. There was no stenosis identified in these areas and good flow throughout the fistula. With compression of the fistula, the arterial anastomosis appeared to be patent with a smaller aneurysm near  the anastomotic site. The main outflow in the upper arm was through the basilic vein. The central veins were patent. At this point, I elected to terminate the procedures as there  were no endovascular options for improvement of this fistula; 4-0 Monocryl pursestring suture was placed around the sheath. The sheath was removed, pressure was held. Sterile dressing was placed. The patient tolerated the procedure well and was taken to the recovery room in stable condition.    ____________________________ Annice NeedyJason S. Dew, MD jsd:dm [name]: 06/05/2012 10:27:46 ET T: 06/05/2012 10:46:01 ET JOB#: [id number]  cc: Annice NeedyJason S. Dew, MD, <Dictator> Annice NeedyJASON S DEW MD ELECTRONICALLY SIGNED 06/16/2012 16:56

## 2014-08-20 NOTE — Discharge Summary (Signed)
PATIENT NAME:  [name], [name] MR#:  [id number] DATE OF BIRTH:  1922-08-13  DATE OF ADMISSION:  08/03/2012 DATE OF DISCHARGE:  08/12/2012  DISCHARGE DIAGNOSES:  1. Acute respiratory failure secondary to pulmonary edema that is resolved.  2. End-stage renal disease on hemodialysis on Monday, Wednesday, and Friday.  3. Status post left arteriovenous fistula revision.  4. Insulin-dependent diabetes. 5. Hypothyroidism.  6. Hypertension.  7. Coronary artery disease.  8. Osteoarthritis.  9. Gout. 10. Hyperlipidemia.  11. Gastroesophageal reflux disease. 12. Obstructive sleep apnea on CPAP.   DISCHARGE MEDICATIONS:  1. Finasteride 5 mg p.o. daily at bedtime.  2. Lactobacillus 2 capsules p.o. b.i.[name]. 3. Calcium acetate 2 capsules p.o. t.i.[name]. with meals.  4. Allopurinol 100 mg p.o. daily.  5. Omega-3 fatty acids 1 capsule b.i.[name].  6. Simvastatin 20 mg 1 tab daily. 7. Omeprazole 20 mg p.o. b.i.[name].  8. Glipizide 5 mg p.o. daily.  9. Renal caps 1 capsule p.o. daily.  10. Acetaminophen 325 mg 2 tabs p.o. b.i.[name]. p.r.n. for pain and fever.  11. Lantus 10 to 12 units at bedtime depending on blood sugar; give 12 units if blood sugar is greater than 200, give 10 units otherwise.  12. Loratadine 10 mg p.o. daily. 13. Levothyroxine 50 mcg p.o. daily.  14. Furosemide 40 mg p.o. daily, give on days that he is not receiving dialysis, which is Tuesday, Thursday, Saturday, Sunday.  15. Flunisolide nasal 25 mcg 2 puffs b.i.[name].  16. Aspirin 81 mg p.o. daily.  17. Lidocaine 5% topical, apply to affected area once a day as needed.  18. Metoprolol succinate 100 mg p.o. daily.  19. Albuterol 2.5 mg per 3 mL, 3 mL inhaled per neb b.i.[name]. as needed.  CONSULTS:  1. Vascular Surgery.  2. Nephrology.   PROCEDURES: The patient had a left AV fistula revision performed.   PERTINENT LABS AND STUDIES: Sodium 132, potassium 4.7, creatinine 0.42, glucose 121, albumin 2.6. White blood cell count 6.6, hemoglobin 10.5,  platelets 195. Blood cultures were no growth to date. Chest x-ray does show interstitial edema with a density in the right infrahilar region, possible infiltrate, small pleural effusion bilateral.   BRIEF HOSPITAL COURSE: 1. Acute respiratory failure. The patient initially came in complaining of acute respiratory failure. Chest x-ray consistent with interstitial edema. He underwent emergent hemodialysis per Nephrology and his symptoms resolved and improved. He continued on his home hemodialysis regimen Monday, Wednesday, Friday and did not have further issues with his dyspnea. He did remain on oxygen for some period of time, but resolved and was on room air on day of discharge.  2. AV fistula repair. The patient was seen by Vascular Surgery as he had previously planned to have AV fistula repair on Thursday, 08/07/2012. He was seen by Dr. Gilda CreaseSchnier and underwent the procedure without any complications. They were able to use the AV fistula for his hemodialysis without issues.   Other chronic issues remain stable at this time. No changes to his regimens.   DISPOSITION: He is considerably weak and needs further PT and nursing care; therefore, he is being sent to [facility] rehab. He is to follow up with Dr. Burnadette PopLinthavong one week after discharge from the nursing facility. He is to call Dr. Marijean HeathSchnier's office to discuss appropriate followup for the AV fistula.    ____________________________ Marisue IvanKanhka Shanecia Hoganson, MD kl:es [name]: 08/12/2012 07:53:39 ET T: 08/12/2012 08:15:05 ET JOB#: [id number]  cc: Marisue IvanKanhka Natika Geyer, MD, <Dictator> Marisue IvanKANHKA Lilla Callejo MD ELECTRONICALLY SIGNED 08/25/2012 8:23

## 2014-08-20 NOTE — H&P (Signed)
DATE OF BIRTH:  06/21/1922  DATE OF ADMISSION:  08/03/2012  PRIMARY CARE PHYSICIAN:  Dr. Burnadette Pop  CARDIOLOGIST:  Dr. Welton Flakes  Patient follows also with [facility]  CHIEF COMPLAINT:  Shortness of breath.   HISTORY OF PRESENT ILLNESS:  This is an 81 year old man with end-stage renal disease, on hemodialysis Monday, Wednesday, Friday. He has been having progressive shortness of breath. He did go through a whole dialysis session on Friday. Shortness of breath became really severe today when he was walking back to the car, and can hardly walk secondary to shortness of breath. He has been coughing up whitish phlegm. No complaints of chest pain. He does have nausea. He does have difficulty eating. In the ER, he was found to have a very elevated blood pressure. Chest x-ray consistent with heart failure. BNP elevated at 41,276 and elevated potassium at 5.8. Hospitalist services were contacted for further evaluation. The patient was placed on BiPAP for respiratory failure.   PAST MEDICAL HISTORY:  End-stage renal disease on hemodialysis Monday, Wednesday, Friday. BPH, hypothyroidism, diabetes, hypertension, gout, obesity.   PAST SURGICAL HISTORY:  Cholecystectomy, hernia, kidney stone.   ALLERGIES:  LISINOPRIL.   MEDICATIONS: Include Tylenol 325 mg 2 tablets twice a day, albuterol inhalation solution 3 mL twice a day, allopurinol 100 mg daily, aspirin 81 mg daily, calcium acetate 667 mg 2 capsules 3 times a day with meals, finasteride 5 mg at bedtime, flunisolide 25 mcg per inhalation 2 puffs twice a day nasally, Lasix 40 mg daily, glipizide 5 mg daily, Lactobacillus 2 tablets twice a day, Lantus 10 to 12 units subcutaneous injection at bedtime, levothyroxine 50 mcg daily, lidocaine topical applied to affected area as needed daily, loperamide 2 mg 1 capsule in the morning as needed for diarrhea, loratadine 1 tablet daily, metoprolol ER 100 mg daily, omega-3 polyunsaturated fatty acids 1 capsule twice  a day, that actually is on hold since March 27, omeprazole 20 mg twice a day, renal caps 1 capsule daily, simvastatin 20 mg 1/2 tablet at bedtime.   SOCIAL HISTORY: Quit smoking 50 years ago. No alcohol. No drug use. Used to work in [employer], fashion/clothing.   FAMILY HISTORY:  Father died at 43 of a CVA, had diabetes. Mother died at 31, had diabetes.  REVIEW OF SYSTEMS:   CONSTITUTIONAL: Positive for chills. Positive for sweats. Positive for weight loss. No weakness or fatigue.  EYES:  He does wear glasses.  EARS, NOSE, MOUTH AND THROAT:  Decreased hearing. Positive for runny nose. No sore throat. No difficulty swallowing.  CARDIOVASCULAR:  No chest pain. No palpitations.  RESPIRATORY: Positive for shortness of breath. Positive for cough, whitish phlegm. No hemoptysis.  GASTROINTESTINAL: Positive for nausea. No vomiting. No abdominal pain. Positive for diarrhea every morning. GENITOURINARY:  He urinates very little.  MUSCULOSKELETAL:  No joint pain or muscle pain.  INTEGUMENT:  Positive for nonhealing ulcer over the dialysis graft.  NEUROLOGIC: No fainting or blackouts.  PSYCHIATRIC: No anxiety or depression.  ENDOCRINE:  Positive for hypothyroidism.  HEMATOLOGIC AND LYMPHATIC:  No anemia.   PHYSICAL EXAMINATION: VITAL SIGNS: Temperature 98.1, pulse 99, respirations 22, blood pressure 209/98, pulse ox 99%.  GENERAL:  Positive respiratory distress.  EYES:  Conjunctivae and lids normal. Pupils equal, round and reactive to light. Extraocular muscles intact. No nystagmus. Ears, nose, mouth and throat: Tympanic membranes: No erythema. Nasal mucosa:  No erythema. Throat: No erythema, no exudates seen. Lips and gums: No lesions.  NECK:  Positive JVD. No bruits. No lymphadenopathy.  No thyromegaly. No thyroid nodules palpated.  RESPIRATORY: Positive use of accessory muscles to breathe. Poor air entry bilaterally. Positive rales halfway up lung field.  CARDIOVASCULAR SYSTEM: S1, S2 normal. A 3/6 systolic ejection  murmur. Carotid upstroke 2+ bilaterally. No bruits. Dorsalis pedis pulses 1+ bilaterally, with 3+ edema bilateral lower extremities. ABDOMEN:  Soft, nontender. No organomegaly/splenomegaly. Normoactive bowel sounds. No masses felt.  LYMPHATIC:  No lymph nodes in the neck.  MUSCULOSKELETAL: No clubbing, 3+ edema. No cyanosis on BiPAP.  SKIN:  Chronic lower extremity discoloration scabs over the left dialysis access graft.  NEUROLOGIC:  Cranial nerves II through XII grossly intact. Deep tendon reflexes 1+ bilateral lower extremities. Left lower extremity in a brace.  PSYCHIATRIC:  Patient is oriented to person, place and time.   LABORATORY AND RADIOLOGICAL DATA:  EKG shows sinus tachycardia, 102 beats per minute, left axis deviation, Q waves inferiorly, intraventricular conduction delay. White blood cell count 11.6, H and H 13.0 and 39.7, platelet count 215, MCV 107. Glucose 225, BUN 25, creatinine 6.76, sodium 136, potassium 5.8, chloride 100, CO2 of 27, calcium 9.1. Liver function tests are normal range. Troponin negative.   ASSESSMENT AND PLAN: 1.  Acute respiratory failure. Currently on BiPAP in order to oxygenate 50% oxygen. This is secondary to congestive heart failure.   2.  Hyperkalemia. Potassium of 5.8. Emergency Room physician ordered Kayexalate, calcium chloride, sodium bicarb. Glucose and insulin for acute treatment, but I think dialysis will help the most. Nephrology to evaluate for acute dialysis.   3.  Acute systolic congestive heart failure. Echocardiogram to evaluate ejection fraction. Patient does have a history of mitral regurgitation. Will evaluate with echo. The patient has an allergic reaction to ACE INHIBITORS. This is contraindicated. Patient is already on metoprolol, 80 mg of Lasix IV once ordered by ER physician. Did speak with Nephrology to consider acute dialysis tonight.   4.  Malignant hypertension. Likely secondary to the acute respiratory failure. Will continue usual  medications. Nitro patch added by ER physician. Dialysis should help out with the blood pressure.   5.  End-stage renal disease, on hemodialysis. Nephrology to decide on acute dialysis tonight or not.   6.  Hyperlipidemia, on Zocor.   7.  Hypothyroidism, on Synthroid.   8.  Diabetes. Continue Lantus and glipizide, and put on sliding scale.   9.  Gastroesophageal reflux disease, on omeprazole.   10.  Obesity and sleep apnea. Patient on CPAP at night, currently on BiPAP. Will continue that tonight.   11.  Chronic diarrhea. Will send off stool studies. The patient seems like he has had this for a long period of time. The patient takes antidiarrheals. Can consider colonoscopy as outpatient. The patient did have one back in 2012 that was negative.   Time spent on admission:  55 minutes of critical care time.   The patient will be admitted to the ICU to watch respiratory status closely.    ____________________________ Herschell Dimesichard J. Renae GlossWieting, MD rjw:mr D: 08/03/2012 21:20:19 ET T: 08/03/2012 21:44:02 ET JOB#: [id number]  cc: Herschell Dimesichard J. Renae GlossWieting, MD, <Dictator> [facility] Marisue IvanKanhka Linthavong, MD [facility] [place] Dr. Welton FlakesKhan, cardiology      Salley ScarletICHARD J Dairon Procter MD ELECTRONICALLY SIGNED 08/04/2012 15:21

## 2014-08-21 NOTE — Discharge Summary (Signed)
PATIENT NAME:  [name], Johnathan D MR#:  [name] DATE OF BIRTH:  1922/09/27  DATE OF ADMISSION:  05/10/2013 DATE OF DISCHARGE:  05/14/2013   DISCHARGE DIAGNOSES:  1. Left lower extremity cellulitis.  2. End-stage renal disease, on hemodialysis Monday, Wednesday and Friday.  3. Insulin-dependent diabetes.  4. Hypertension.  5. Chronic left popliteal thrombus.  6. Anemia of chronic disease.   DISCHARGE MEDICATIONS:  1. Finasteride 5 mg p.o. at bedtime.  2. Lactobacillus 2 tabs p.o. b.i.d.  3. Calcium acetate 667 mg capsules 2 capsules p.o. t.i.d. with meals.  4. Omega-3 fatty acids 1 capsule p.o. b.i.d.  5. Simvastatin 10 mg tabs 1/2-tab p.o. at bedtime.  6. Omeprazole 20 mg p.o. b.i.d. with meals.  7. Glipizide 5 mg p.o. daily in the morning.  8. Acetaminophen 325 mg 2 tabs p.o. b.i.d.  9. Lantus 10 to 15 units subcutaneous injection at bedtime that is based on a CBG reading.  10. Loratadine 10 mg 1 tab p.o. daily.  11. Flunisolide nasal spray 25 mcg 2 puffs b.i.d.  12. Aspirin 81 mg p.o. daily.  13. Metoprolol succinate extended release 25 mg 1/2-tab p.o. daily.  14. Renal Caps 1 capsule p.o. at bedtime.  15. Allopurinol 100 mg p.o. daily.  16. Augmentin 500 one tab p.o. daily for 7 more days. 17. Levothyroxine 50 mcg p.o. daily.   CONSULTATIONS: Nephrology.   PROCEDURES: None.   PERTINENT LABORATORIES AND STUDIES: On day of discharge, sodium 140, potassium 4.3, BUN of 22, creatinine 5.22, glucose of 61. White blood cell count 6.5, hemoglobin 9.9, platelets of 156. PTH of 466. Did have an ultrasound of the lower extremity that did show a chronic left popliteal thrombus, nonobstructive, nonocclusive.   BRIEF HOSPITAL COURSE:  1. Left lower extremity cellulitis. The patient initially came in with acute pain, erythema and swelling of the left lower extremity consistent with acute cellulitis. Had a white blood cell count of 12.8, was afebrile. Was placed on vancomycin and Zosyn initially  for 48 hours. He was transitioned over to oral Augmentin, and his clinical symptoms continued to improve, and he remained afebrile. White blood cell count did trend down to 6.51 on day of discharge. He will complete another 7 days of the Augmentin.  2. Chronic left popliteal thrombus. This was noted on an ultrasound of the lower extremity that showed nonocclusive thrombus. The patient was initially started on Coumadin by the admitting physician. I discontinued this and placed him on heparin and will continue with aspirin therapy. May consider repeating an ultrasound in 2 to 3 months to reassess the thrombus.  3. Other chronic issues remain stable at this time. Will continue his home regimen. Will continue with his dialysis as scheduled.   DISPOSITION: He is in stable condition to be discharged to home.   FOLLOWUP: Follow up with Dr. Burnadette PopLinthavong within 10 days.   ____________________________ Marisue IvanKanhka Mariam Helbert, MD kl:lb D: 05/14/2013 12:16:15 ET T: 05/14/2013 12:51:01 ET JOB#: [id number]  cc: Marisue IvanKanhka Keyion Knack, MD, <Dictator> Marisue IvanKANHKA Lilliah Priego MD ELECTRONICALLY SIGNED 06/19/2013 10:47

## 2014-08-21 NOTE — Consult Note (Signed)
General Aspect Thrombus in the right common femoral   Present Illness This patient is an 81 year old male patient with ESRD on hemodialysis, hypertension, peripheral vascular disease, hypothyroidism, diabetes, who because of persistent chest pain the patient was taken to cardiac catheterization today. The patient's cardiac catheterization showed moderate disease, medical management was advised by Dr. Welton Flakes, but the cardiac catheterization also revealed probable subacute clot in femoral artery.  The patient complains of right leg pain for a while and has been having some history of falls at home lately.    PAST MEDICAL HISTORY:  Significant for history of recent admission from November 26 to 28 and the patient was discharged on 28th, the patient was here because of chest pain and he was discharged home with possible stress test as outpatient.  He has a history of ESRD on hemodialysis Monday, Wednesday, Friday, peripheral vascular disease, history of hypothyroidism, diabetes, hypertension, and obesity, and gout.   PAST SURGICAL HISTORY: Cholecystectomy, hernia repair, kidney stones.   Home Medications: Medication Instructions Status  isosorbide mononitrate 30 mg oral tablet, extended release 1 tab(s) orally once a day Active  finasteride 5 mg oral tablet 1 tab(s) orally once a day (at bedtime)  Active  omeprazole 20 mg oral delayed release capsule 1 cap(s) orally 2 times a day Active  glipiZIDE 5 mg oral tablet 1 tab(s) orally once a day (in the morning) Active  acetaminophen 325 mg oral tablet 2 tab(s) orally 2 times a day Active  Lantus 100 units/mL subcutaneous solution 10 unit(s) subcutaneous once a day (at bedtime) Active  loratadine 10 mg oral tablet 1 tab(s) orally once a day (in the morning) Active  flunisolide nasal 25 mcg/inh nasal spray 2 puff(s) nasal 2 times a day, As Needed for congestion.  Active  allopurinol 100 mg oral tablet 1 tab(s) orally once a day (in the morning) Active   aspirin 81 mg oral delayed release tablet 1 tab(s) orally once a day (in the morning) Active  calcium acetate 667 mg oral tablet 2 tab(s) orally 2 times a day (with meals) Active  Probiotic Formula - oral capsule 2 cap(s) orally 2 times a day Active  levothyroxine 50 mcg (0.05 mg) oral tablet 1 tab(s) orally once a day (at bedtime) Active  metoprolol succinate 100 mg oral tablet, extended release 0.5 tab(s) orally once a day (in the evening) Active  Fish Oil 1000 mg oral capsule 1 cap(s) orally 2 times a day Active  simvastatin 20 mg oral tablet 1 tab(s) orally 2 times a day Active  albuterol  inhaled , As Needed Active  Cozaar 25 mg oral tablet 1 tab(s) orally once a day Active  furosemide 40 mg oral tablet 1 tab(s) orally once a day Active  loperamide 2 mg oral tablet 1 tab(s) orally 3 times a day Active  Rena-Vite Vitamin B Complex with C and Folic Acid oral tablet 1 tab(s) orally once a day Active    Lisinopril: Cough  Case History:  Family History Non-Contributory   Social History negative tobacco, negative ETOH, negative Illicit drugs   Review of Systems:  Fever/Chills No   Cough No   Sputum No   Abdominal Pain No   Diarrhea No   Constipation No   Nausea/Vomiting No   SOB/DOE No   Chest Pain No   Telemetry Reviewed NSR   Dysuria No   Physical Exam:  GEN well developed, well nourished, no acute distress   HEENT hearing intact to voice, dry oral  mucosa, poor dentition   NECK supple  trachea midline   RESP normal resp effort  no use of accessory muscles   CARD regular rate  no JVD   ABD denies tenderness  soft   EXTR pedal pulses are not palpable bilaterally, slugish cap refill bilaterally   SKIN positive rashes, skin turgor poor   NEURO cranial nerves intact, follows commands   PSYCH alert, poor insight   Nursing/Ancillary Notes: **Vital Signs.:   01-Dec-15 20:12  Vital Signs Type Post-Procedure  Temperature Temperature (F) 98.1  Celsius  36.7  Pulse Pulse 88  Respirations Respirations 16  Systolic BP Systolic BP 130  Diastolic BP (mmHg) Diastolic BP (mmHg) 77  Mean BP 94  Pulse Ox % Pulse Ox % 94  Pulse Ox Activity Level  At rest  Oxygen Delivery Room Air/ 21 %    Impression 1.  Femoral artery clot.  patietn is admitted and now on a heparin gtt.  I will plan for angio with intervention tomorrow.  If this is not successful open thrombectmy will be attempted.  The risks and benefits were reviewed with the patient all questions answered he agrees to proceed 2.  History of coronary artery disease, medical management was advised by Dr. Welton FlakesKhan. Continue aspirin, Imdur, and beta blockers, and statins.  3.  Diabetes mellitus type 2. Continue home medications with glipizide and also Lantus.  4.  Gout. Continue allopurinol 100 mg daily.  5.  End-stage renal disease on hemodialysis.  Consult nephrology for his hemodialysis needs.   Plan Consult level 4   Electronic Signatures: Levora DredgeSchnier, Gregory (MD)  (Signed 01-Dec-15 23:21)  Authored: General Aspect/Present Illness, Home Medications, Allergies, History and Physical Exam, Vital Signs, Impression/Plan   Last Updated: 01-Dec-15 23:21 by Levora DredgeSchnier, Gregory (MD)

## 2014-08-21 NOTE — Discharge Summary (Signed)
PATIENT NAME:  [name], [name] MR#:  [id number] DATE OF BIRTH:  [birth year]  DATE OF ADMISSION:  04/14/2014 DATE OF DISCHARGE:    DISCHARGE DIAGNOSES:  1. Acute diarrhea.  2. End-stage renal disease on hemodialysis.   DISCHARGE MEDICATIONS:  1. Finasteride 5 mg p.o. daily at bedtime.  2. Glipizide 5 mg p.o. daily in the morning with breakfast.  3. Acetaminophen 325 mg 2 tabs p.o. b.i.[name]. as needed for pain.  4. Lantus 10 units subcutaneous injections at bedtime.  5. Loratadine 10 mg p.o. daily.  6. Allopurinol 100 mg p.o. daily.  7. Calcium acetate 667 mg 2 tabs p.o. b.i.[name]. with meals..  8. Probiotic formula 2 capsules p.o. b.i.[name].  9. Metoprolol succinate 100 mg half a tab p.o. daily.  10. Fish oil 1000 mg p.o. b.i.[name].  11. Loperamide 2 mg t.i.[name]. as needed for diarrhea.  12.  Simvastatin 20 mg p.o. at bedtime.  13. Albuterol 2.5 mg, 3 mL every 6 hours as needed for wheezing.  14. Aspirin 81 mg p.o. daily.  15. Losartan 25 mg p.o. daily.  16. Fluticasone nasal 50 mcg 2 sprays to each nostril daily as needed.  17. Frusemide 40 mg p.o. daily.  18. Imdur 30 mg p.o. daily.  19. Levothyroxine 50 mcg p.o. daily.  20. Rena-Vite 1 tab p.o. daily.   CONSULTS: Gastroenterology per Dr. Mechele CollinElliott and nephrology for hemodialysis and physical therapy for assessment.   PERTINENT LABORATORY AND STUDIES: On day of discharge, sodium 141, potassium 3.9, creatinine 6.93, glucose 119. Flexible sigmoidoscopy showed no ulcerative colitis or Crohn's C. difficile was negative.   BRIEF HOSPITAL COURSE:  1. Diarrhea. The patient initially came in with acute diarrhea, C. difficile negative, likely viral. Symptoms did start to improve. Supportive care at this time.  2. End-stage renal disease, unchanged. The patient did receive hemodialysis during his hospital stay and will continue as scheduled as an outpatient.   DISPOSITION: He is in stable condition to discharge to home with home health, PT for generalized  weakness. Needs further evaluation at home and followup with me within 10 days.    ____________________________ Marisue IvanKanhka Amandeep Hogston, MD kl:jh [name]: 04/16/2014 08:56:45 ET T: 04/16/2014 14:30:56 ET JOB#: [id number]  cc: Marisue IvanKanhka Mckenzi Buonomo, MD, <Dictator> Marisue IvanKANHKA Mahayla Haddaway MD ELECTRONICALLY SIGNED 04/20/2014 15:28

## 2014-08-21 NOTE — Consult Note (Signed)
Pt stool shows no WBC or RBC.  Likely not bacterial origin given no WBC in stool.  Would continue antibiotics for 3-5 days only.  Diabetes itself can cause  a secretory type diarrhea which will not show WBC.  Viral diarrhea usually will not show WBC in stool.  Collagenous colitis is also a possibility but this happens more often in women.  Will do flex sig Thursday or Friday for mucosal sampling.  Electronic Signatures: Scot JunElliott, Robert T (MD)  (Signed on 16-Dec-15 17:16)  Authored  Last Updated: 16-Dec-15 17:16 by Scot JunElliott, Robert T (MD)

## 2014-08-21 NOTE — H&P (Signed)
PATIENT NAME:  [name], [name] MR#:  [id number] DATE OF BIRTH:  07-01-1922  DATE OF ADMISSION:  05/10/2013  PRIMARY CARE PHYSICIAN: Dr. Burnadette Pop.   REFERRING PHYSICIAN: Dr. Dorothea Glassman.  CHIEF COMPLAINT: Left leg redness and swelling.   HISTORY OF PRESENT ILLNESS: [name] is an 81 year old pleasant male with past medical history of endstage renal disease on hemodialysis, hypertension, hyperlipidemia, and chronic respiratory failure who presented to the Emergency Department with complaints of left leg swelling and redness for the last 2 to 3 days. This afternoon woke up and noted to have increased worsening of the redness. Concerning this came to the Emergency Department. On work-up in the Emergency Department, the patient was found to have nonocclusive popliteal DVT. Denies having any recent trauma, denies recent travel. The patient denies having any fever. The patient denies having history of clots in the past. The patient received vancomycin in the Emergency Department.   PAST MEDICAL HISTORY:  1.  Endstage renal disease on hemodialysis Monday, Wednesday, and Friday.  2.  Peripheral vascular disease.  3.  Fracture of the left foot.  4.  Hypothyroidism.  5.  Diabetes mellitus.  6.  Hypertension.  7.  Gout.  8.  Obesity.   PAST SURGICAL HISTORY: 1.  Cholecystectomy.  2.  Hernia repair.  3.  Kidney stones.   ALLERGIES: LISINOPRIL.   HOME MEDICATIONS: 1.  Simvastatin 10 mg once a day.  2.    1 capsule once a day.  3.  Omeprazole 20 mg 2 times a day.  4.  Omega-3 fatty acids.  5.  Toprol-XL 1 tablet once a day.  6.  Loratadine 10 mg once a day.  7.  Lidocaine 5% topical to the area. 8.  Levothyroxine 50 mcg once a day.  9.  Lantus 10 to 12 units subcutaneous daily.  10.  Lactobacillus 2 tablets 2 times a day.  11.  Glipizide 5 mg once a day.  12.  Lasix 40 mg daily.  13.  Flunisolide 25 mcg 2 times a day.  14.  Finasteride 5 mg once a day.  15.  Calcium acetate 667 two tablets  3 times a day.  16.  Aspirin 81 mg once a day.  17.  Allopurinol 100 mg once a day.  18.  Albuterol as needed.  19.  Acetaminophen 2 tablets 2 times a day.   SOCIAL HISTORY: Former smoker. Denies drinking alcohol or using illicit drugs. Married, lives with his wife. Independent of ADLs.  FAMILY HISTORY: Father died at the age of 30 from CVA. Diabetes mellitus. Mother died at the age of 65.   REVIEW OF SYSTEMS: CONSTITUTIONAL: Denies having any generalized weakness.  EYES: No change in vision.  ENT: No change in hearing.  RESPIRATORY: No cough, shortness of breath.  CARDIOVASCULAR: No chest pain, palpitations.  GASTROINTESTINAL: No nausea, vomiting, abdominal pain.  GENITOURINARY: Makes a small amount of urine. SKIN: No rashes or lesions.  NEUROLOGIC: No weakness or numbness in any part of the body.  ENDOCRINE: Carries the diagnosis of hypothyroidism.  NEUROLOGIC: No weakness or numbness in any part of the body.  SKIN: Has redness and swelling of the left leg.   PHYSICAL EXAMINATION: GENERAL: This is a well-built, well-nourished age-appropriate male lying down in the bed, not in distress.  VITAL SIGNS: Temperature 98.2, pulse 86, blood pressure 131/56, respiratory rate of 16, and oxygen saturation is 98% on room air.  HEAD: Normocephalic, atraumatic. EYES: No scleral icterus. Conjunctivae normal. Pupils equal  and react to light. Extraocular movements are intact. ENT: Mucous membranes moist. No pharyngeal erythema.  NECK: Supple. No lymphadenopathy. No JVD. No carotid bruit. No thyromegaly.  CHEST: Has no focal tenderness. Lungs bilaterally clear to auscultation.  HEART: S1 and S2 regular. No murmurs are heard.  ABDOMEN: Bowel sounds present. Soft, nontender, and nondistended. No hepatosplenomegaly.  EXTREMITIES: Left lower extremity with redness extending from foot to below knee, which has been marked. Pulses 2+. MUSCULOSKELETAL:  Good range of motion of all the  extremities. NEUROLOGIC: The patient is alert and oriented to place, person, and time. Cranial nerves II through XII intact. Motor 5/5 in upper and lower extremities.   LABORATORY AND DIAGNOSTICS: CBC: WBC of 13.8, hemoglobin 10.4, and platelet count of 170.   Venous Dopplers: Nonocclusive thrombus which appears adherent to the wall of the popliteal vein. This may represent chronic thrombus.   CTA: Mild central bronchial wall thickening consistent with bronchitis. lung nodule deep in the left lower lobe 1.4 cm. Recommend follow-up CT. Mild cardiomegaly. Severe 3 vessel coronary artery atherosclerosis. High risk for bronchogenic carcinoma.   CMP is completely within normal limits.   ASSESSMENT AND PLAN: Mr. [name] is an 81 year old male who comes to the Emergency Department with left lower extremity cellulitis.  1.  Cellulitis. We will treat with vancomycin. Continue to follow up with the improvement on  Vancomycin and Zosyn considering the patient's history of diabetes mellitus as well as history of chronic end-stage renal disease.  2.  Chronic obstructive pulmonary disease exacerbation. The patient recently recovered from cold, cough. Now has bilateral wheezing. Continue the breathing treatments and Solu-Medrol.  3.  End-stage renal disease on hemodialysis Monday, Wednesday, and Friday schedule. Consult nephrology.  4.  Deep venous thrombosis, nonocclusive. However, we will keep the patient on Coumadin until INR is therapeutic. Commented as a chronic venous thrombosis.  5.  Hypertension. Currently well-controlled.  6.  Diabetes mellitus. Continue the current dose and continue sliding scale insulin.  7.  Keep the patient on deep vein thrombosis prophylaxis with heparin until INR is therapeutic.   TIME SPENT: 50 minutes. ____________________________ Susa GriffinsPadmaja Bettey Muraoka, MD pv:sb D: 05/10/2013 22:00:27 ET T: 05/11/2013 08:31:38 ET JOB#: [id number]  cc: Susa GriffinsPadmaja Torrie Namba, MD, <Dictator> Marisue IvanKanhka  Linthavong, MD Susa GriffinsPADMAJA Saryiah Bencosme MD ELECTRONICALLY SIGNED 05/24/2013 21:19

## 2014-08-21 NOTE — H&P (Signed)
PATIENT NAME:  Johnathan LabellaSAUL, Cesario D MR#:  [id number] DATE OF BIRTH:  1923/02/10  DATE OF ADMISSION:  04/12/2014  PRIMARY CARE PHYSICIAN: Marisue IvanKanhka Linthavong, MD  REQUESTING PHYSICIAN: Jene Everyobert Kinner, MD   CHIEF COMPLAINT: Diarrhea.  HISTORY OF PRESENT ILLNESS: The patient is an 81 year old male with a known history of end-stage renal disease on hemodialysis, peripheral vascular disease and hypothyroidism who is being admitted for persistent diarrhea and failure to thrive. The patient went to holiday party at the church last Thursday, was doing okay on Friday, got his regular dialysis done, but on Saturday morning he started having diarrhea, continued to get worse through the day and at night he was miserable. Sunday morning he took some Kaopectate which helped him some, but started getting worse as the day progressed. All night yesterday he could not sleep, he continued to have loose watery diarrhea, was feeling so weak and dizzy and was also now noticing some blood in the stool, likely from too much wiping with ongoing diarrhea. He denies any nausea or vomiting. He does have some abdominal soreness from persistent diarrhea. He is just afraid to walk around much due to unable to control his bowel movements.   PAST MEDICAL HISTORY: 1.  End-stage renal disease on hemodialysis Monday, Wednesday and Friday.  2.  Peripheral vascular disease.  3.  Hypothyroidism. 4.  Diabetes. 5.  Hypertension.  6.  Obesity.  7.  Gout.  8.  Acute thrombosis of the right common femoral vessel on last admission, from December 1 through April 01, 2014.   PAST SURGICAL HISTORY: 1.  Cholecystectomy.  2.  Hernia repair. 3.  Kidney stone.   ALLERGIES: LISINOPRIL.   SOCIAL HISTORY: He is a former smoker, quit 40 years ago. No alcohol or drugs. He lives with his wife.   FAMILY HISTORY: Father died at the age of 79 from CVA and diabetes. Mother died at age of 79.   HOME MEDICATIONS: 1.  Tylenol 650 mg p.o. b.i.d. as needed.   2.  Albuterol nebulizer every 6 hours as needed. 3.  Allopurinol 100 mg p.o. daily. 4.  Aspirin 81 mg p.o. daily.  5.  Calcium acetate 667 mg 2 tablets p.o. b.i.d.  6.  Finasteride 5 mg p.o. at bedtime.  7.  Fish oil 1000 mg p.o. b.i.d.  8.  Flonase 2 sprays intranasally twice a day as needed.  9.  Lasix 40 mg p.o. daily. 10.  Glipizide 5 mg p.o. daily.  11.  Imdur 30 mg p.o. daily. 12.  Insulin Lantus 10 units subcutaneous at bedtime. 13.  Levothyroxine 50 mcg p.o. daily.  14.  Loperamide 2 mg p.o. 3 times a day as needed.  15.  Loratadine 10 mg p.o. daily. 16.  Losartan 25 mg p.o. daily.  17.  Simvastatin 20 mg p.o. at bedtime.   REVIEW OF SYSTEMS: CONSTITUTIONAL: No fever. Positive fatigue, weakness.  EYES: No blurred or double vision. ENT: No tinnitus or ear pain.  RESPIRATORY: No cough, wheezing, hemoptysis.  CARDIOVASCULAR: No chest pain, orthopnea, edema.  GASTROINTESTINAL: No nausea or vomiting. Positive for diarrhea and some abdominal soreness. No hematemesis or melena. He does have some blood on his bottom likely from wiping, constant diarrhea.  GENITOURINARY: No dysuria or hematuria. He is a dialysis patient.  ENDOCRINE: No polyuria or nocturia.  HEMATOLOGY: No anemia or easy bruising.  SKIN: No rash or lesion.  MUSCULOSKELETAL: Positive for arthritis.  NEUROLOGIC: No tingling, numbness. Positive for generalized weakness.  PSYCHIATRY: No history of  anxiety or depression.   PHYSICAL EXAMINATION: VITAL SIGNS: Temperature 97.6, heart rate 90 per minute, respirations 18 per minute, blood pressure 124/69. He is saturating 97% on room air.  GENERAL: The patient is an 81 year old obese male with a BMI of 32.9, sitting in the chair comfortably without any acute distress.  EYES: Pupils equal, round, and reactive to light and accommodation. No scleral icterus. Extraocular muscles intact.  HEENT: Head atraumatic, normocephalic. Oropharynx and nasopharynx clear.  NECK: Supple.  No jugular venous distention. No thyroid enlargement or tenderness.  LUNGS: Clear to auscultation bilaterally. No wheezing, rales, rhonchi, or crepitation.  CARDIOVASCULAR: S1, S2 normal. No murmurs, rubs or gallops.  ABDOMEN: Soft, nontender, nondistended. Bowel sounds present. No organomegaly or mass.  EXTREMITIES: No pedal edema, cyanosis or clubbing.  NEUROLOGIC: Cranial nerves II through XII intact. Muscle strength 5/5 in all extremities. Sensation intact.  PSYCHIATRIC: The patient is alert and oriented x3. SKIN: No obvious rash, lesion or ulcer. MUSCULOSKELETAL: No joint effusion or tenderness.   DIAGNOSTIC DATA: Normal liver function test. BMP within normal limits, except BUN of 48, creatinine 9.23, sodium 135, potassium 5.3 and glucose 129. CBC within normal limits, except hemoglobin of 11.2, hematocrit 34.6. Negative stool for C. difficile.   IMPRESSION AND PLAN: 1.  Persistent diarrhea. Could be viral in nature. We will send out stool studies. His Clostridium difficile is negative. 2.  Failure to thrive with persistent diarrhea, weakness, and poor p.o. intake. We will get physical therapy evaluation and management. May need placement. 3.  Endstage renal disease, on hemodialysis. Will consult nephrology for hemodialysis need as he is due for dialysis today due to his Monday, Wednesday, Friday schedule. 4.  Hyperkalemia. This should get corrected with hemodialysis today.   CODE STATUS: FULL code.   TOTAL TIME TAKING CARE OF THIS PATIENT: 45 minutes.  ____________________________ Ellamae Sia. Sherryll Burger, MD vss:sb D: 04/12/2014 11:39:45 ET T: 04/12/2014 12:21:54 ET JOB#: [id number]  cc: Phung Kotas S. Sherryll Burger, MD, <Dictator> Marisue Ivan, MD Ellamae Sia [facility] MD ELECTRONICALLY SIGNED 04/13/2014 18:15

## 2014-08-21 NOTE — Op Note (Signed)
PATIENT NAME:  [name], [name] DATE OF BIRTH:  21-Nov-1922  DATE OF PROCEDURE: 10/29/2013.   PREOPERATIVE DIAGNOSES:  1.  End-stage renal disease.  2.  Poorly functioning left arm dialysis access.  3.  Hypertension.   POSTOPERATIVE DIAGNOSIS:  1.  End-stage renal disease.  2.  Poorly functioning left arm dialysis access.  3.  Hypertension.   PROCEDURES:  1.  Ultrasound guidance for vascular access to left arm AV fistula/jump graft.  2.  Left upper extremity fistulogram and central venogram.  3.  Percutaneous transluminal angioplasty of left innominate vein with A 12-mm diameter angioplasty balloon.  4.  Percutaneous transluminal angioplasty of the anastomosis of the jump graft to basilic vein with 7-mm diameter angioplasty balloon.   SURGEON: Annice NeedyJason S. Edmond Ginsberg, M.[name].   ANESTHESIA: Local with moderate conscious sedation.   ESTIMATED BLOOD LOSS: Minimal.   FLUOROSCOPY TIME: Approximately 2 minutes.   CONTRAST:  30 mL contrast were used.  INDICATION FOR PROCEDURE: An 81 year old gentleman with long-standing end-stage renal disease. He has a left radiocephalic AV fistula that has been revised with a jump graft to the basilic vein just below the antecubital fossa with a proximal anastomosis just beyond the previous radiocephalic anastomosis.  This has had diminished flows on dialysis and a noninvasive study showed stenosis, particularly at the venous anastomosis with the basilic vein. He is brought in for imaging with angiography and potential treatment. Risks and benefits were discussed. Informed consent was obtained.   DESCRIPTION OF PROCEDURE: The patient is brought to the vascular suite. The left upper extremity was sterilely prepped and draped, and a sterile surgical field was created. The jump graft was accessed in its midportion under direct ultrasound guidance with a Micropuncture needle, and a Micropuncture wire and sheath were then placed. Imaging showed about a 75% to 80%  stenosis from intimal hyperplasia at the venous anastomosis and basilic vein with the distal portion of the PTFE jump graft. The outflow was through the basilic vein in the upper arm. In the central venous circulation there was a 70% stenosis in the left innominate vein with significant collateralization and reflux up to the jugular vein.   The patient was heparinized, a 6-French sheath was placed. I used a Magic torque wire across both these lesions without difficulty. I initially treated the central venous stenosis with a 12-mm diameter angioplasty balloon with a good angiographic completion result and only about a 20% to 30% residual stenosis.   I then turned my attention to the stenosis of the jump graft to the basilic vein near the antecubital fossa. This lesion was treated with a 7-mm diameter angioplasty balloon. A waist was taken and had to be inflated to 20 atmospheres to resolve the narrowing. With the balloon inflated imaging was performed which showed the arterial anastomosis. There was some mild narrowing of the PTFE graft to the previous fistula. This was less than 50% and was not flow-limiting. The balloon was deflated and completion angiogram showed markedly improved flow with only about a 10% residual stenosis. At this point, I elected to terminate the procedure. The sheath was removed, a 4-0 Monocryl pursestring suture was placed. Pressure was held. Sterile dressing was placed. The patient tolerated the procedure well and was taken to the recovery room in stable condition.    ____________________________ Annice NeedyJason S. Srinika Delone, MD jsd:lt [name]: 10/29/2013 10:45:09 ET T: 10/29/2013 15:07:41 ET JOB#: [id number]  cc: Annice NeedyJason S. Deona Novitski, MD, <Dictator> Annice NeedyJASON S Deneen Slager MD ELECTRONICALLY SIGNED 11/02/2013  15:46 

## 2014-08-21 NOTE — Consult Note (Signed)
PATIENT NAME:  [name], [name] MR#:  [id number] DATE OF BIRTH:  [birth year]  DATE OF CONSULTATION:  04/12/2014  REFERRING PHYSICIAN:  Delfino Lovett, MD  CONSULTING PHYSICIAN:  Lynnae Prude, MD and Ranae Plumber. Arvilla Market, ANP (Adult Nurse Practitioner)  PRIMARY CARE PHYSICIAN:  Marisue Ivan, MD   REASON FOR CONSULTATION: Diarrhea.   HISTORY OF PRESENT ILLNESS: This 81 year old patient with a history of end-stage renal disease on hemodialysis, peripheral vascular disease, and hypotension was admitted into the hospital today for acute episode of diarrhea. The patient reports that Thursday night he ate at his Sunday school class potluck which was barbecue chicken and banana pudding.  Friday he ate at a restaurant and that was steak and gravy.  Saturday morning he started to have mild diarrhea that progressed throughout the day to the point of repetitive nocturnal diarrhea Saturday night through Sunday morning.  He took a dose of Kaopectate on Sunday and that seemed to slow down his bowels for a few hours.  He developed diarrhea again Sunday night and was up and down all night and could not sleep.  He called his son and asked him to bring him to the hospital at 5:00 this morning.  He reports he has had several episodes of bowel movement while he had been here, but he cannot quantitate.  He has noted no blood or melena. He denies any abdominal pain or cramps with this. He does note that his stomach is growling. He has had slight nausea, no vomiting, no fevers or chills. He says he feels pretty good.   PAST MEDICAL HISTORY:  1.  End-stage renal disease on hemodialysis Monday, Wednesday, Friday.  2.  Peripheral vascular disease, recent hospitalization 12/01 through 12/03 with acute thrombus, right common femoral but that resolved.  3.  Hypothyroidism.  4.  Diabetes mellitus.  5.  Hypertension.  6.  Obesity.  7.  Gout.  8.  Coronary artery disease.   PAST SURGICAL HISTORY:  1.  Cholecystectomy.  2.  Hernia  repair.  3.  Kidney stones.   HOME MEDICATIONS:  1.  Tylenol 650 mg twice daily as needed.  2.  Albuterol nebulizer every 6 hours as needed.  3.  Allopurinol 100 mg daily.  4.  Aspirin 81 mg daily.  5.  Calcium acetate 667 mg 2 tablets twice daily.  6.  Finasteride 5 mg at bedtime.  7.  Fish oil 1000 mg twice daily.  8.  Flonase two sprays intranasally twice daily as needed.  9.  Lasix 40 mg daily.  10. Glipizide 5 mg daily.  11. Imdur 30 mg daily; this is a new medication for this patient. 12. Insulin 10 units subcutaneous at bedtime. 13. Levothyroxine 50 mcg daily.  14. Loperamide 2 mg 3 times daily as needed.  15. Loratadine 10 mg daily.  16.  Losartan 25 mg daily.  17.  Simvastatin 20 mg at bedtime.   ALLERGIES: LISINOPRIL   HABITS: Previous smoker, quit 40 years ago, negative alcohol, lives with his wife.   FAMILY HISTORY: Father deceased at 19, CVA and diabetes. Mother deceased at age 28.   REVIEW OF SYSTEMS:  Twelve systems reviewed, positive for fatigue, weakness from terrible diarrhea.  He says he could hardly walk.  Gastrointestinal history as noted. The patient did have a recent chest pain and he says that has resolved with the Imdur. He has had the right common femoral blood clot also has noted with recent hospitalization, he reports that has resolved, and  it is not giving any problems.   PHYSICAL EXAMINATION:  VITAL SIGNS: Temperature 97.7, heart rate 78, respiratory rate 18, blood pressure 123/69.  GENERAL: Well-appearing, elderly male. Good color. He is currently in hemodialysis.  HEENT: Head is normocephalic. Conjunctivae pale pink. Sclerae anicteric. Oral mucosa is dry and intact.  NECK: Supple. Trachea is midline.  CARDIAC: S1, S2 without murmur.  LUNGS: CTA anteriorly. Respirations are eupneic.  ABDOMEN: Soft, nontender in all quadrants without HSM or masses.  EXTREMITIES: Without edema, cyanosis, or clubbing.  SKIN: Warm and dry.   NEUROLOGIC: Cranial  nerves grossly intact. The patient is weak, tries to help pull himself up in bed.  PSYCHIATRIC: Affect and mood within normal. He is telling jokes. He is an alert, reasonable historian.  HEMODIALYSIS:  Involving left arm, some bruising noted in the skin.   LABORATORY DATA: Admission blood work with glucose 129, BUN 48 creatinine 9.23, potassium 5.3 and now receiving dialysis.  Albumin is 3.3. Liver panel otherwise normal. TSH is 4.23, WBC 7.0, hemoglobin 11.2 stool studies negative for C. difficile comprehensive culture with no Campylobacter.   IMPRESSION: The patient with an acute episode of diarrhea, may be food poisoning since there is association with eating out with Sunday school potluck. The patient has no significant pain, no tenderness or bleeding.  Possibility of viral gastroenteritis is also considered. This does not seem to be ischemic colitis with the absence of pain and blood. The patient has had a recent colonoscopy 2012 performed by Dr. Niel HummerIftikhar with finding of large internal hemorrhoids. Laboratory studies are pending for stool for comprehensive culture.  He has received IV fluids and is getting dialyzed and hemodynamically he appears stable.  The patient is not receiving antibiotics at this time.   PLAN: Continue with the clinical monitoring of diarrhea to see if it resolves. The patient states he has had a couple episodes of bowel movement today, but it seems to be slowing. Nursing flow sheet shows that he has had one large bowel movement recorded since he has been on the floor. We will request nursing to monitor and record all bowel movements. Further GI recommendations pending the patient's clinical course. This case was discussed with Dr. Mechele CollinElliott in collaborative care.    Thank you for this consultation.    These services provided by Amedeo KinsmanKimberly Aurthur Wingerter.  ____________________________ Ranae PlumberKimberly A. Arvilla MarketMills, ANP (Adult Nurse Practitioner) kam:DT D: 04/12/2014 16:23:55 ET T: 04/12/2014  17:09:02 ET JOB#: [id number]  cc: Cala BradfordKimberly A. Arvilla MarketMills, ANP (Adult Nurse Practitioner), <Dictator>  Ranae PlumberKimberly A. Suzette BattiestMills RN, MSN, ANP-BC Adult Nurse Practitioner ELECTRONICALLY SIGNED 04/13/2014 11:29

## 2014-08-21 NOTE — Op Note (Signed)
PATIENT NAME:  [name], [name] DATE OF BIRTH:  1922/05/05  DATE OF PROCEDURE:  03/31/2014   PREOPERATIVE DIAGNOSES:  1.  Mobile thrombus right femoral artery after cardiac catheterization.  2.  End-stage renal disease.  3.  Hypertension.   POSTOPERATIVE DIAGNOSES:  1.  Mobile thrombus right femoral artery after cardiac catheterization.  2.  End-stage renal disease.  3.  Hypertension.   PROCEDURE:   1.  Ultrasound guidance for vascular access to left femoral artery.  2.  Catheter placement to right superficial femoral artery from left femoral approach.  3.  Aortogram and selective right lower extremity angiogram.  4.  StarClose closure device, left femoral artery.   SURGEON: Johnathan NeedyJason S Quiara Killian, MD    ANESTHESIA: Local with moderate conscious sedation.   ESTIMATED BLOOD LOSS: Minimal.   FLUOROSCOPY TIME: Approximately 3 minutes.   CONTRAST USED: 40 mL.   INDICATION FOR PROCEDURE: This is a gentleman with a known history of end-stage renal disease.  He had a cardiac catheterization done earlier this week with pain resultant in his right foot.  He had an ultrasound which showed mobile thrombus in the right femoral artery. He was admitted and heparinized and brought down approximately 24 hours after his admission for angiogram.  Risks and benefits were discussed.  Informed consent was obtained.   DESCRIPTION OF PROCEDURE: The patient is brought to the vascular suite. Groins were shaved and prepped and a sterile surgical field was created. The left femoral head was localized with fluoroscopy and left femoral artery was accessed under direct ultrasound guidance without difficulty with Seldinger needle. A J-wire and 5 French sheath were placed. Pigtail catheter was placed in the aorta at the L1 and L2 level and AP aortogram was performed. This showed ectasia of the aorta and iliac segments without clear aneurysmal degeneration.  The renal arteries had sluggish flow consistent with  long-standing end-stage renal disease. There were no stenoses in the aortoiliac segments and the filling was brisk.  I then crossed the aortic bifurcation with an advantage wire and the pigtail catheter advanced to the mid right external iliac artery and selective right lower extremity angiogram was performed initially through this. This showed no stenoses or thrombus within the common iliac artery, external iliac artery, common femoral artery, profunda femoris artery, or superficial femoral artery.  There was diffuse atherosclerotic calcification and as it went downstream the flow became a little more sluggish so I advanced the catheter into the mid superficial femoral artery to opacify the popliteal and tibial.  This popliteal artery was patent.  There was about a 40% stenosis in the distal SFA and he had 2 vessel runoff to the foot distally.   Magnified images performed in different oblique in the femoral artery to ensure there was not a thrombus that I was not seeing and there was not.  At this point, I elected to terminate the procedure.  The diagnostic catheter was removed.  Oblique arteriogram was performed of the left femoral artery and StarClose closure device was deployed in the usual fashion with excellent hemostatic result.  The patient tolerated the procedure well and was taken to the recovery room in stable condition.        ____________________________ Johnathan NeedyJason S. Helene Bernstein, MD jsd:DT D: 03/31/2014 15:57:42 ET T: 03/31/2014 17:08:43 ET JOB#: [id number]  cc: Johnathan NeedyJason S. Letia Guidry, MD, <Dictator> Johnathan NeedyJASON S Darshawn Boateng MD ELECTRONICALLY SIGNED 04/29/2014 14:12

## 2014-08-21 NOTE — H&P (Signed)
PATIENT NAME:  Johnathan LabellaSAUL, Hobie D MR#:  [id number] DATE OF BIRTH:  [birth year]  DATE OF ADMISSION:  03/25/2014  PRIMARY CARE PHYSICIAN: Marisue IvanKanhka Linthavong, MD.  REFERRING PHYSICIAN: Coolidge BreezeGraydon S. Goodman, MD.  CHIEF COMPLAINT: Chest pain.  HISTORY OF PRESENT ILLNESS: Mr. [name] is an 81 year old male with a history of end-stage renal disease on hemodialysis, hypertension, hyperlipidemia, peripheral vascular disease, diabetes mellitus, who comes to the Emergency Department with a complaint of chest pain on the left side of the chest. The patient states he was resting, started to experience pain on the left side of the chest 10/10 intensity. Denies any nausea, vomiting, diaphoresis. No radiation of the pain. Workup in the Emergency Department with 2 sets of cardiac enzymes was negative including CK-MB. Initially, the Emergency Department physician discharged the patient home. However, as the patient was getting into the car, he started to experience another episode of chest pain. Concerning this, the patient was brought back to the Emergency Department and requested hospitalist service to watch for this.  PAST MEDICAL HISTORY: 1. End-stage renal disease on hemodialysis Monday, Wednesday, Friday.  2. Peripheral vascular disease. 3. Fracture of the left foot. 4. Hypothyroidism. 5. Diabetes mellitus. 6. Hypertension. 7. Gout. 8. Obesity.  PAST SURGICAL HISTORY: 1. Cholecystectomy.  2. Hernia repair.  3. Kidney stones.  ALLERGIES:  LISINOPRIL.  HOME MEDICATIONS: 1. Simvastatin 20 mg 2 times a day. 2. Rena-Vite 1 tablet once a day. 3. Probiotic 2 capsules 2 times a day. 4. Omeprazole 20 mg 2 times a day. 5. Toprol XL 50 mg once a day. 6. Loratadine 10 mg once a day. 7. Levothyroxine 50 mcg once a day. 8. Lantus 10 units once a day. 9. Glipizide 5 mg once a day. 10. Flunisolide drops. 11. Finasteride 5 mg once a day. 12. Calcium acetate 2 tablets 2 times a day. 13. Aspirin 81 mg daily. 14.  Allopurinol 100 mg once a day. 15. Acetaminophen 650 mg 2 times a day.  SOCIAL HISTORY: Former smoker, quit 40 years back. Denies drinking alcohol or using illicit drugs. Married. Lives with his wife.  FAMILY HISTORY: Father died at the age of 79 from CVA, diabetes mellitus. Mother died at the age of 79.  REVIEW OF SYSTEMS:  CONSTITUTIONAL: Denies any generalized weakness. EYES: No change in vision. EARS, NOSE, THROAT: No change in hearing. RESPIRATORY: No cough, shortness of breath,  CARDIOVASCULAR: No chest pain, palpitations. GASTROINTESTINAL: No nausea, vomiting, abdominal pain. GENITOURINARY: No dysuria or hematuria. HEMATOLOGY: No easy bruising or bleeding. SKIN: No rash or lesions. MUSCULOSKELETAL: No joint pains and aches.  NEUROLOGIC: No weakness or numbness in any part of the body.  PHYSICAL EXAMINATION: GENERAL: This is a well-built, well-nourished, age-appropriate male lying down in the bed, not in distress. VITAL SIGNS: Temperature 98.4, pulse 83, blood pressure 113/50, respiratory rate of 17. Oxygen saturation is 99% on room air. HEENT: Head normocephalic, atraumatic. No scleral icterus. Conjunctivae normal. Pupils equal, round, reactive to light. Mucous membranes moist. No pharyngeal erythema. NECK: Supple without lymphadenopathy. No JVD. No carotid bruit.  CHEST: No focal tenderness.  LUNGS: Bilaterally clear to auscultation. HEART: S1, S2 regular. No murmurs are heard. ABDOMEN: Bowel sounds present. Soft, nontender, nondistended. No hepatosplenomegaly. EXTREMITIES: No pedal edema. Pulses 2+. NEUROLOGIC: Patient is alert, oriented to place, person and time. Cranial nerves II-XII intact. Motor 5/5 in upper and lower extremities.   LABORATORY DATA: CMP: BUN 38, creatinine of 7.79, potassium 5.8. Rest of the values are within normal limits. Troponin x  2 sets negative.  Chest x-ray 1 view portable: No acute cardiopulmonary disease. Heart mildly enlarged.   ASSESSMENT  AND PLAN: Mr. Phommachanh is an 81 year old male with end-stage renal disease on hemodialysis, diabetes mellitus, hypertension, who comes to the Emergency Department with complaints of chest pain,  1. Chest pain. The patient is already ruled out with 2 sets of cardiac enzymes. The patient's pain started around 3:30. By  midnight. Cycled 2 sets of cardiac enzymes. It is very unlikely that it is acute coronary syndrome even thought the patient has multiple risk factors of diabetes mellitus, hypertension, and end-stage renal disease on hemodialysis. We will admit the patient under observation, cycle a third set of cardiac enzymes. If negative, the patient could be discharged home. The patient states he had a stress test done the beginning of this year.  2. End-stage renal disease. Will consult nephrology in the morning for hemodialysis. 3. Hyperkalemia. Will give 1 dose of Kayexalate. 4. Diabetes mellitus. Continue the home dose of the insulin. 5. Hypertension. Currently well controlled. Will continue the home dose of the blood pressure medications. 6. Keep the patient on deep vein thrombosis prophylaxis with the heparin drip.  TIME SPENT: 55 minutes.    ____________________________ Susa Griffins, MD pv:jh D: 03/26/2014 04:39:00 ET T: 03/26/2014 07:18:46 ET JOB#: [id number]  cc: Susa Griffins, MD, <Dictator> Marisue Ivan, MD Susa Griffins MD ELECTRONICALLY SIGNED 03/26/2014 22:41

## 2014-08-21 NOTE — Consult Note (Signed)
Came to see pt he was in dialysis.  If his diarrhea continues tomorrow I would start cipro and flagyl even if cultures neg.  Electronic Signatures: Scot JunElliott, Itzamara Casas T (MD)  (Signed on 14-Dec-15 18:41)  Authored  Last Updated: 14-Dec-15 18:41 by Scot JunElliott, Zhana Jeangilles T (MD)

## 2014-08-21 NOTE — Consult Note (Signed)
Pt with prolonged diarrhea, 6-7 a day.  I agree with starting antibiotics.  Collagenous colitis is another possiblity.  Will send stool for WBC, if positive it favors bacterial infection or mucosal inflammation.  Electronic Signatures: Scot JunElliott, Zyah Gomm T (MD)  (Signed on 15-Dec-15 18:26)  Authored  Last Updated: 15-Dec-15 18:26 by Scot JunElliott, Kyian Obst T (MD)

## 2014-08-21 NOTE — Discharge Summary (Signed)
PATIENT NAME:  [name], Jumaane D MR#:  [id number] DATE OF BIRTH:  [birth year]  DATE OF ADMISSION:  03/30/2014 DATE OF DISCHARGE:  04/01/2014   DISCHARGE DIAGNOSES:  1.  Acute thrombus of the right common femoral that is resolving.  2.  Mild peripheral arterial disease.  3.  Hypertension.  4.  Adult-onset diabetes.  5.  Coronary disease.  6.  End-stage renal disease on hemodialysis Monday/Wednesday/Friday.   DISCHARGE MEDICATIONS:  1.  Finasteride 5 mg p.o. at bedtime.  2.  Glipizide 5 mg p.o. daily with breakfast.  3.  Acetaminophen 325 mg 2 tablets p.o. b.i.d.  4.  Lantus 10 units subcutaneous at bedtime.  5.  Loratadine 1 tablet p.o. daily.  6.  Flunisolide nasal 25 mcg 2 sprays to each nostril b.i.d.  7.  Allopurinol 100 mg p.o. daily.  8.  Aspirin 81 mg p.o. daily.   9.  Calcium acetate 667 mg 2 tablets p.o. b.i.d. with meals.  10.  Probiotic formula 2 capsules p.o. b.i.d.  11.  Levothyroxine 50 mcg p.o. daily.  12.  Metoprolol succinate 100 mg 1/2 tablet p.o. daily.  13.  Fish oil 1000 mg p.o. b.i.d.  14.  Imdur 30 mg 1 tablet daily.  15.  Albuterol 2 puffs every 4 hours p.r.n. for wheezing.  16.  Cozaar 25 mg p.o. daily. 17.  Furosemide 40 mg p.o. daily.  18.  Loperamide 2 mg p.o. t.i.d.  19.  Rena-Vite vitamins 1 capsule daily.  20.  Simvastatin 20 mg p.o. at bedtime.   CONSULTS: Vascular surgery and nephrology.   PROCEDURES: The patient underwent angiogram that showed that the thrombus was resolving.   PERTINENT LABORATORIES: Prior to discharge, sodium 137, potassium 4.9, creatinine 7.52, BUN 38, glucose 123, calcium 9.1, phosphorus of 6. White blood cell count 6.6, hemoglobin 10, platelets 164,000. Cardiac enzymes are negative.   BRIEF HOSPITAL COURSE:  1.  Acute thrombus. The patient initially came in after finding an acute thrombus of the right common femoral with complaints of right foot pain and history of mild peripheral arterial disease. The patient was evaluated by  vascular surgery and was placed on a heparin drip. He underwent an angiogram that showed that the thrombus was resolving. He clinically started to improve. Pain lessened. Therefore, planned to discharge home with followup as needed with vascular surgery. Continue with aspirin and simvastatin.  2.  Other chronic issues are stable at this time. We will continue with the home regimen.   DISPOSITION: In stable condition, discharged to home.    ____________________________ Marisue IvanKanhka Maryland Luppino, MD kl:MT D: 04/01/2014 08:44:46 ET T: 04/01/2014 13:33:14 ET JOB#: [id number]  cc: Marisue IvanKanhka Stevin Bielinski, MD, <Dictator> Marisue IvanKANHKA Dondre Catalfamo MD ELECTRONICALLY SIGNED 04/20/2014 15:26

## 2014-08-21 NOTE — H&P (Signed)
PATIENT NAME:  [name], [name] MR#:  [id number] DATE OF BIRTH:  November 25, 1922  DATE OF ADMISSION:  03/30/2014  PRIMARY DOCTOR: Dr. Burnadette PopLinthavong.    REFERRING PHYSICIAN:  Dr. Ursula BeathShaukat Kahn.    CHIEF COMPLAINT:  Right leg clot.   HISTORY OF PRESENT ILLNESS: This patient is an 81 year old male patient with ESRD on hemodialysis, hypertension, peripheral vascular disease, hypothyroidism, diabetes, had chest pain and he was here on November 26 and he was discharged on November 28, the patient evaluated for chest pain. The patient went to see Dr. Adrian BlackwaterShaukat Khan for stress test as an outpatient, because of persistent chest pain the patient was taken to cardiac catheterization today. The patient's cardiac catheterization showed moderate disease, medical management was advised by Dr. Welton FlakesKhan, but the cardiac catheterization also revealed possible clot in femoral artery. The patient was seen by Dr. Adrian BlackwaterShaukat Khan and Dr. Gilda CreaseSchnier also looked at the angiogram report and Dr. Gilda CreaseSchnier recommended a heparin drip and admission to medical service and possible angiogram and thrombectomy tomorrow. The patient complains of right leg pain for a while and has been having some history of falls at home lately.    PAST MEDICAL HISTORY:  Significant for history of recent admission from November 26 to 28 and the patient was discharged on 28th, the patient was here because of chest pain and he was discharged home with possible stress test as outpatient.  He has a history of ESRD on hemodialysis Monday, Wednesday, Friday, peripheral vascular disease, history of hypothyroidism, diabetes, hypertension, and obesity, and gout.   PAST SURGICAL HISTORY: Cholecystectomy, hernia repair, kidney stones.   ALLERGIES: LISINOPRIL.   SOCIAL HISTORY: Former smoker, quit 40 years ago. No alcohol. No drugs. Lives with wife.   FAMILY HISTORY: Father died at the age of 79 from CVA and diabetes. Mother died at age of 79.    MEDICATIONS AT HOME:  Finasteride 5 mg  p.o. daily, omeprazole 20 mg p.o. b.i.[name]., glipizide 5 mg p.o. daily, acetaminophen 320 mg 2 tablets p.o. b.i.[name]., Lantus 10 units once a day, loratadine 10 mg p.o. daily, flunisolide 25 mcg 2 puffs b.i.[name]., allopurinol 100 mg p.o. daily, aspirin 81 mg p.o. daily, calcium acetate 667 mg 2 tablets p.o. b.i.[name]., levothyroxine 50 mcg p.o. daily, metoprolol succinate 100 mg p.o. half tablet once a day, fish oil 1 gram p.o. b.i.[name]., simvastatin 20 mg p.o. b.i.[name]., Imdur 30 mg p.o. daily.   REVIEW OF SYSTEMS:  CONSTITUTIONAL: Denies any fever, fatigue  HEAD: The patient denies any headache.  EARS, NOSE, AND THROAT:  No ear pain. No epistaxis. No difficulty swallowing.  CARDIOVASCULAR:  The patient had chest pains and was evaluated with Dr. Adrian BlackwaterShaukat Khan, denies any chest pain now.  PULMONARY: No trouble breathing, no wheezing, no cough.  GASTROINTESTINAL: No nausea. No vomiting. No abdominal pain.  GENITOURINARY: No dysuria.  ENDOCRINE: No polyuria or polydipsia.  MUSCULOSKELETAL: Complains of right leg pain and arthritis.  NEUROLOGIC: No history of strokes or TIAs.  PSYCHIATRIC: No anxiety or insomnia.   PHYSICAL EXAMINATION:  VITAL SIGNS: Temperature 98.1, heart rate 83, blood pressure 135/60, O2 saturations 98% on room air.  GENERAL: He is alert, awake, oriented, elderly male, not in distress, answering questions appropriately.  HEAD: Atraumatic, normocephalic.  EYES: Pupils equal, reacting to light. Extraocular movements are intact.  EARS, NOSE, AND THROAT: No tympanic membrane congestion. No turbinate hypertrophy. No pharyngeal edema.  NECK: Supple. No JVD. No carotid bruit. Normal range of motion.    CARDIOVASCULAR: S1, S2  regular. No murmurs. PMI not displaced.  Bilateral pulses are equal in carotid artery and femoral artery.  LUNGS: Clear to auscultation. No wheeze. No rales. Not using accessory muscles of respiration.  GASTROINTESTINAL: Abdomen is soft, nontender, obese. Bowel sounds present. No  organomegaly. No hernias.  EXTREMITIES: The patient complains of right leg tenderness. Extremities move x 4. No extremity edema. No cyanosis, no clubbing.  NEUROLOGIC: Alert, awake, oriented. Cranial nerves II through XII intact. Power 5 out of 5 upper and lower extremities. Sensory intact. DTRs 2 + bilaterally.  PSYCHIATRIC:  Mood and affect are within normal limits.   SKIN: Inspection is normal. No skin ulcers and well hydrated.   LABORATORY DATA: WBC 6.8, hemoglobin 11, hematocrit 33.1, platelets 155,000.  Electrolytes, sodium is 135, potassium 4.2, chloride 95, bicarbonate 33, BUN 18, creatinine 4.8, glucose 96. LFTs within normal limits. CBC and METB are from November 28.  Today the patient's cardiac catheterization showed EF 50%, moderate to severe mid LAD calcified lesion,  also mild circumflex and RCA disease. The patient has right dominant coronary circulation and proximal LAD 70% stenosed. Potassium is 5.2.   ASSESSMENT AND PLAN:  1.  An 81 year old male patient with right leg pain with possible femoral artery clot.  We are going to admit the patient to medical service, start him on heparin drip, and consult Dr. Gilda Crease from vascular for possible angiogram and clot lysis tomorrow.  2.  History of coronary artery disease, medical management was advised by Dr. Welton Flakes. Continue aspirin, Imdur, and beta blockers, and statins.  3.  Diabetes mellitus type 2. Continue home medications with glipizide and also Lantus.  4.  Gout. Continue allopurinol 100 mg daily.  5.  End-stage renal disease on hemodialysis.  Consult nephrology for his hemodialysis needs.   TIME SPENT: About 55 minutes.      ____________________________ Katha Hamming, MD sk:bu [name]: 03/30/2014 16:51:35 ET T: 03/30/2014 17:39:46 ET JOB#: [id number]  cc: Katha Hamming, MD, <Dictator> Katha Hamming MD ELECTRONICALLY SIGNED 04/24/2014 19:42

## 2014-08-21 NOTE — Consult Note (Signed)
Since no evidence of ulcerative colitis or Crohn's disease on flex sig I will start some Imodium for help with his diarrhea.  Bx done of sigmoid colon.  He did go the night up til 5 am without a bowel movement so he may be turning the corner.  Electronic Signatures: Scot JunElliott, Robert T (MD)  (Signed on 17-Dec-15 17:03)  Authored  Last Updated: 17-Dec-15 17:03 by Scot JunElliott, Robert T (MD)

## 2014-08-21 NOTE — Consult Note (Signed)
PATIENT NAME:  [name], Johnathan D MR#:  [id number] DATE OF BIRTH:  02/17/1923  DATE OF CONSULTATION:  03/26/2014  REFERRING PHYSICIAN:  Emergency Room  CONSULTING PHYSICIAN:  Laurier NancyShaukat A. Ida Uppal, MD  CARDIOLOGY CONSULTATION   INDICATION FOR CONSULTATION: Chest pain.   HISTORY OF PRESENT ILLNESS: This is an 81 year old white male who is very well known to me. I recently saw him in the office. He presented to the Emergency Room with chest pain. He described the chest pain as dull pain in the left precordium, associated with some shortness of breath and diaphoresis. He had initial set of cardiac enzymes negative, and was being discharged after a second set also being negative, but in ER, he started developing pain again and then it was decided to admit him and have him observed overnight. He denies any further chest pain, and 3 sets of cardiac enzymes are negative. He is actually eating breakfast and is quite comfortable.   PAST MEDICAL HISTORY: He has a history of end-stage renal disease on hemodialysis 3 times a week, peripheral vascular disease, history of hypothyroidism, diabetes, hypertension, gout, obesity.   ALLERGIES: LISINOPRIL.   HOME MEDICATIONS (ESPECIALLY CARDIAC MEDICATIONS) ARE: Toprol-XL 50 mg, simvastatin 20 mg, aspirin 81 mg.   SOCIAL HISTORY: He quit smoking 40 years ago. Denies EtOH abuse.   FAMILY HISTORY: Positive for coronary artery disease.   PHYSICAL EXAMINATION:  GENERAL: He is alert, oriented x 3, in no acute distress right now.  VITALS: Stable.  NECK: No JVD.  LUNGS: Clear.  HEART: Regular rate and rhythm. Normal S1, S2. No audible murmur.  ABDOMEN: Soft, nontender; positive bowel sounds.  EXTREMITIES: No pedal edema.  NEUROLOGIC: The patient appears to be intact. His 3 sets of cardiac enzymes are negative. EKG shows normal sinus rhythm, old inferior wall MI with nonspecific ST-T changes. No acute ST changes.   ASSESSMENT AND PLAN: The patient has atypical chest pain.  Myocardial infarction has been ruled out. Advise discharging the patient home on Imdur 30 mg once a day, and we will set up stress test on Tuesday next week at 8:30 a.m.  Thank you very much for this referral.     ____________________________ Laurier NancyShaukat A. Shirin Echeverry, MD sak:MT D: 03/26/2014 09:01:34 ET T: 03/26/2014 09:15:22 ET JOB#: [id number]  cc: Laurier NancyShaukat A. Tayron Hunnell, MD, <Dictator> Laurier NancySHAUKAT A Quinterious Walraven MD ELECTRONICALLY SIGNED 04/01/2014 15:38

## 2014-08-23 LAB — SURGICAL PATHOLOGY

## 2014-08-25 NOTE — Discharge Summary (Signed)
PATIENT NAME:  [name], [name] MR#:  [id number] DATE OF BIRTH:  1922/12/04  DATE OF ADMISSION:  03/25/2014 DATE OF DISCHARGE:  03/27/2014  CONSULTING CARDIOLOGIST: Dr. Welton FlakesKhan.   CONSULTING NEPHROLOGIST: Dr. Thedore MinsSingh.   DISCHARGE DIAGNOSES: 1.  Chest pain.  2.  Hyperkalemia.  3.  End-stage renal disease.   HISTORY OF PRESENT ILLNESS: Please see initial history and physical for details. Briefly, this is an 81 year old gentleman with history of end-stage renal disease, hypertension, hyperlipidemia, peripheral vascular disease and diabetes who was admitted with chest pain on the left side 10/10.   HOSPITAL COURSE BY ISSUE:   1. The patient was seen in the ED. He had negative enzymes and was seen by Dr. Welton FlakesKhan his cardiologist, who recommended that he be discharged and have followup with a stress test in 3 days. He was chest pain free the rest of the hospitalization.  2.  Hyperkalemia. The patient was given Kayexalate. He also underwent dialysis on Friday and will resume his outpatient hemodialysis schedule of Monday, Wednesday, Friday.  DISCHARGE FOLLOWUP: The patient will follow up with Dr. Welton FlakesKhan on Tuesday, December 1 and also with Dr. Burnadette PopLinthavong.   DISCHARGE MEDICATIONS: [facility].   CODE STATUS: Full code.   DISCHARGE DIET: Low salt, regular consistency.   TIME:  This discharge took 35 minutes.    ____________________________ Stann Mainlandavid P. Sampson GoonFitzgerald, MD dpf:at [name]: 03/27/2014 13:16:57 ET T: 03/27/2014 15:48:44 ET JOB#: [id number]  cc: Stann Mainlandavid P. Sampson GoonFitzgerald, MD, <Dictator> DAVID Sampson GoonFITZGERALD MD ELECTRONICALLY SIGNED 05/02/2014 21:17

## 2014-08-29 NOTE — H&P (Signed)
PATIENT NAME:  [name], [name] MR#:  [id number] DATE OF BIRTH:  1922-06-27  DATE OF ADMISSION:  06/21/2014  ADDENDUM:   PHYSICAL EXAMINATION:  VITAL SIGNS:  Blood pressure 150/70, pulse 70, and respirations 16.  GENERAL: Slight respiratory distress on 100% nonrebreather.  EYES: Conjunctivae and lids normal. Unable to test extraocular muscles.  EARS, NOSE, MOUTH AND THROAT: Tympanic membranes: No erythema. Nasal mucosa: No erythema. Throat: No erythema. No exudate seen. Lips and gums: No lesions.  NECK: No JVD. No bruits. No lymphadenopathy. No thyromegaly. No thyroid nodules palpated.  RESPIRATORY: Slight use of accessory muscles, rales at bilateral base.  CARDIOVASCULAR: S1, S2 normal. No gallops, rubs, or murmurs heard. Carotid upstroke 2+ bilaterally. No bruises. Dorsalis pedis pulses 1+ bilaterally, 2+ edema bilateral lower extremity.  ABDOMEN: Soft, nontender. No organomegaly/splenomegaly. Normoactive bowel sounds. No masses felt.  LYMPHATIC: No lymph nodes in the neck.  PSYCHIATRIC: Prior to code the patient was alert, oriented to person, place, and time. After code was following commands and moving extremities.  NEUROLOGIC: Cranial nerves II-XII grossly intact. Deep tendon reflexes 1+ bilateral lower extremities.  PSYCHIATRIC: The patient after code following commands.  SKIN: No rashes or ulcers seen.   LABORATORY AND RADIOLOGICAL DATA: There is a computer issue where I am unable to get in the computer at this time, but the patient's potassium was 7. His other laboratory and radiological data was reviewed. EKG was a first degree AV block, left axis deviation, right bundle branch block, inferior and anterior ST wave abnormalities. Could also be prior infarct with Q waves.   ASSESSMENT AND PLAN:  1.  Cardiac arrest with asystole right in front of me, likely arrhythmia related.  I performed CPR, pushed epinephrine, 2 amps of sodium bicarbonate and calcium. The patient regained normal sinus  rhythm and blood pressure. Urgent dialysis was needed.  Since there are no ICU beds we did send him downstairs to the dialysis center and I put telemetry orders in.  Hopefully this will all be secondary to the hyperkalemia and arrhythmia. 2.  Severe hyperkalemia requiring stat medications and stat dialysis for improvement.  3.  End-stage renal disease on hemodialysis Monday, Wednesday, Friday. We will have stat dialysis today.  4.  Diabetes. We will put on sliding scale for right now.  5.  Hypertension. I will continue usual medications.  6.  Hyperlipidemia.  Continue Zocor.  7.  Anemia likely of chronic disease. Consider Procrit at dialysis.  Time Spent on history and physical, coordination of care and speaking with the family and cardiopulmonary resuscitation process was a total of a 75 minutes critical care.    ____________________________ Herschell Dimesichard J. Renae GlossWieting, MD rjw:mc [name]: 06/21/2014 12:53:00 ET T: 06/21/2014 13:03:14 ET JOB#: [id number]  cc: Herschell Dimesichard J. Renae GlossWieting, MD, <Dictator> Salley ScarletICHARD J Aldon Hengst MD ELECTRONICALLY SIGNED 06/22/2014 16:40

## 2014-08-29 NOTE — Discharge Summary (Signed)
PATIENT NAME:  Johnathan LabellaSAUL, Koi D MR#:  [id number] DATE OF BIRTH:  09-Sep-1922  DATE OF ADMISSION:  08/14/2014 DATE OF DISCHARGE:  08/19/2014  DISCHARGE DIAGNOSES:  1.  Atrioventricular block, bradycardia, status post pacemaker placement. 2.  End-stage renal disease, on hemodialysis. 3.  Hypertension. 4.  Generalized weakness.   CONDITION ON DISCHARGE: Stable.   DISCHARGE MEDICATIONS: 1.  Finasteride 5 mg once a day.  2.  Loratadine 10 mg once a day. 3.  Gabapentin 100 mg oral once a day. 4.  Isosorbide mononitrate 30 mg extended release once a day.  5.  Acetaminophen 325 mg oral capsule as needed.  6.  Aspirin 81 mg once a day.  7.  Renal vitamin, vitamin B complex, C and folic acid once a day.  8.  Simvastatin 20 mg oral once a day.  9.  Glipizide 5 mg oral tablet once a day.  10.  Calcium acetate 667 mg 1 capsule. 11.  Allopurinol 100 mg once a day. 12.  Lantus 10 units subcutaneous at night.  13.  Levothyroxine 100 mcg once a day.  14.  Loperamide 2 mg oral capsule 3 times a day.  15.  Furosemide 40 mg once a day.  16.  Cozaar 25 mg oral tablet once a day.  17.  Omeprazole 20 mg delayed-release capsule once a day.  18.  Zofran 4 mg oral tablet once a day.  19.  Albuterol 3 mL inhalation 3 times a day as needed for shortness of breath.  DISCHARGE DIET: Low-sodium, carbohydrate-controlled, ADA diet. Regular consistency.  DISCHARGE ACTIVITY: Advised as tolerated.  TIMEFRAME TO FOLLOW: Within 1 to 2 weeks in the cardiology clinic with Dr. Adrian BlackwaterShaukat Khan.  HISTORY OF PRESENTING ILLNESS: A 81 year old Asian male with history of end-stage renal disease on hemodialysis Monday, Wednesday and Friday, hypertension, diabetes type 2 with neuropathy, hypothyroidism, hyperlipidemia, status post cardiac arrest and hypotension secondary to hyperkalemia in March, brought in with weakness in legs. Following his hemodialysis, he started feeling very weak. He decided to come to the Emergency Room. He  denied any local weakness or numbness, denied any fall. Did have some lightheadedness but no loss of consciousness or fever or cough. His EKG in the ER was done which showed sinus rhythm with 43 beats, second degree AV block with 2:1 AV conduction. So the patient was admitted for further management of his bradycardia with symptoms of dizziness.   HOSPITAL COURSE AND STAY: 1.  Second degree type 2 AV block. He was on metoprolol and amiodarone. We stopped that. Pacemaker was placed on the 19th of April. Cardiology was following the patient in the hospital. Physical therapy suggested he be discharged to a rehab center and that is what we did at the end of his hospital stay. 2.  Lower extremity weakness. The patient also had some back pain issues so with suspicion of lumbar problem we did MRI of the lumbar spine, which did not show any acute injuries, some chronic changes, so we suggested to place him in rehab for his generalized weakness. 3.  Endstage renal disease, on hemodialysis. It was continued in the hospital by nephrology team.  4.  Essential hypertension. The patient was relatively hypotensive initially, but later on his blood pressure remained stable after making changes in the medications.   CONSULTS IN THE HOSPITAL: Nephrology, Dr. Mosetta PigeonHarmeet Singh; cardiology, Dr. Adrian BlackwaterShaukat Khan.  IMPORTANT DIAGNOSTIC DATA: Glucose 193, BUN 18, creatinine 5.17, sodium 137, potassium 3.8, chloride 94, CO2 31, calcium 9.1.  Bilirubin 0.3, alkaline phosphate 67, SGPT 8, SGOT 19.   WBC count 8.2, hemoglobin 10.5, platelet count 143,000, MCV 107. Troponin 0.03. TSH 0.49. CD4 was negative.   Echocardiogram was done, showed ejection fraction 70% to 75%, elevated mean left atrial pressure, impaired relaxation, moderately enlarged right ventricle, severely dilated left atrium.   TIME SPENT ON DISCHARGE: 40 minutes.   ____________________________ Hope Pigeon Elisabeth Pigeon, MD vgv:sb D: 08/23/2014 21:33:53  ET T: 08/24/2014 08:21:30 ET JOB#: [id number]  cc: Hope Pigeon. Elisabeth Pigeon, MD, <Dictator> Marisue Ivan, MD Laurier Nancy, MD Altamese Dilling MD ELECTRONICALLY SIGNED 08/24/2014 10:38

## 2014-08-29 NOTE — Discharge Summary (Signed)
PATIENT NAME:  [name], Collie D MR#:  [id number] DATE OF BIRTH:  [birth year]  DATE OF ADMISSION:  06/21/2014 DATE OF DISCHARGE:  06/25/2014  DISCHARGE DIAGNOSES: 1.  Status post cardiac arrest with CPR due to hyperkalemia that is resolving.  2.  End-stage renal disease, on hemodialysis.   DISCHARGE MEDICATIONS: 1.  Finasteride 5 mg p.o. at bedtime. 2.  Glipizide 5 mg p.o. daily.  3.  Loratadine 10 mg p.o. daily.  4.  Allopurinol 100 mg p.o. daily.  5.  Aspirin 81 mg p.o. daily.  6.  Furosemide 40 mg p.o. daily.  7.  Levothyroxine 50 mcg 1 tab p.o. daily.  8.  Calcium acetate 667 mg 2 tabs p.o. t.i.d. with meals.  9.  Lantus 50 units subcutaneous injection at bedtime.  10.  Albuterol ipratropium 3 mL inhaled 4 times a day as needed for wheezing.  11.  Nitroglycerin 0.4 mg sublingual q. 5 minutes as needed for chest pain, up to 3 doses.  12.  Omeprazole 40 mg p.o. b.i.d. prior to meals.  13.  Acetaminophen 325 mg 2 tabs every 4 hours as needed for pain and fever.  14.  Simvastatin 10 mg p.o. at bedtime. 15.  Metoprolol tartrate 50 mg p.o. b.i.d.  16.  Imdur 30 mg p.o. daily.  17.  Docusate 100 mg p.o. b.i.d.  18.  MiraLax 17 grams daily as needed for constipation.   CONSULTANTS: Cardiology, nephrology.  PROCEDURES: None.   DIAGNOSTIC DATA: On day of discharge, sodium 134, potassium 5.5, creatinine 8.25, glucose 122, phosphorus 5.6.  Catheterization showed no acute blockages. EF was normal.   BRIEF HOSPITAL COURSE:  1.  Cardiac arrest. The patient was found to be in asystole and underwent cardiac arrest while in the Emergency Room. CPR was performed. It was secondary to hyperkalemia. He was revived in the Emergency Room and underwent emergent hemodialysis and returned back to his baseline state. He has been back to his baseline state with stable potassium levels since that time. He does complain of some chest soreness. X-rays are negative for acute fractures, likely due to contusion  from the CPR. He has Tylenol as needed for pain.  2.  Endstage renal disease, hemodialysis. Unchanged. Continue with his standard course. He is going to get hemodialysis this morning prior to discharge. 3.  Other chronic issues are stable. Continue with home regimen.   DISPOSITION: He is in stable condition, but needs to be discharged to a skilled nursing facility given his generalized weakness and need for further physical therapy and nursing care. He is to follow up with nephrology, follow up with cardiology and follow up with me in internal medicine department after he gets discharged.  ____________________________ Marisue IvanKanhka Ronisha Herringshaw, MD kl:sb D: 06/25/2014 08:30:00 ET T: 06/25/2014 17:19:50 ET JOB#: [id number]  cc: Marisue IvanKanhka Kingson Lohmeyer, MD, <Dictator> Marisue IvanKANHKA Jazz Rogala MD ELECTRONICALLY SIGNED 07/07/2014 16:38

## 2014-08-29 NOTE — H&P (Signed)
PATIENT NAME:  [name], [name] MR#:  [id number] DATE OF BIRTH:  04/26/1923  DATE OF ADMISSION:  08/14/2014  REFERRING DOCTOR: Loraine Leriche R. Fanny Bien, MD   PRIMARY CARE PRACTITIONER: Marisue Ivan, MD of [facility].   PRIMARY CARDIOLOGIST: Yevonne Pax, MD   ADMITTING DOCTOR: Crissie Figures, MD   CHIEF COMPLAINT: General weakness with the legs giving way.  HISTORY OF PRESENT ILLNESS: A 81 year old Caucasian male with a history of end-stage renal disease on hemodialysis Monday, Wednesday, Friday; hypertension; diabetes mellitus type 2 with neuropathy; hypothyroidism; hyperlipidemia; gout; and status post cardiac arrest secondary to hyperkalemia in March 2016; was brought in with the complaints of weakness with the legs giving way yesterday following his hemodialysis. The patient stated that he undergoes hemodialysis 3 times a week on Monday, Wednesday, and Friday, and after his dialysis on Friday, later in the evening he started feeling weakness and his legs kind of gave way, and hence decided to come to the Emergency Room today. Denies any focal weakness or numbness. Denies any fall. Denies any chest pain, shortness of breath, palpitations. He does have some lightheadedness but no loss of consciousness. No history of any recent fever, cough. In the Emergency Room, the patient was evaluated by the ED physician and was noted to have a heart rate of 40 beats per minute with a normal blood pressure and the patient alert, awake, and oriented. An EKG revealed with a sinus rhythm with 43 beats and a second-degree AV block with 2:1 AV conduction. His cardiologist was consulted by the ED physician, who mentioned that of late his metoprolol dose is being reduced because of lower heart rates, and advised to take him off the metoprolol and admit for further observation. As mentioned earlier, the patient denies any shortness of breath, chest pain, dizziness, or loss of consciousness. He is comfortably resting in the  bed at this time.   PAST MEDICAL HISTORY:  1.  End-stage renal disease on hemodialysis Monday, Wednesday, Friday. Last hemodialysis on last Friday.  2.  Hypertension.  3.  Diabetes mellitus type 2 with neuropathy.  4.  Hypothyroidism.  5.  Hyperlipidemia.  6.  Gout. 7.  Status post cardiac arrest secondary to hyperkalemia in March 2016.   PAST SURGICAL HISTORY: 1.  Cholecystectomy.  2.  Hernia repair.   ALLERGIES: LISINOPRIL.   FAMILY HISTORY: Positive for diabetes mellitus.   SOCIAL HISTORY: He is married. He lives with his wife at home. History of smoking in the past, quit many years ago. No history of any alcohol or substance abuse.  HOME MEDICATIONS: 1.  Acetaminophen 325 mg tablet 2 tablets orally as needed.  2.  Albuterol inhalation solution as needed.  3.  Allergy medication 10 mg orally once a day.  4.  Allopurinol 100 mg 1 tablet orally once a day.  5.  Aspirin 81 mg 1 tablet orally once a day.  6.  Calcium acetate 667 mg oral capsule, frequency not known.  7.  Cozaar 25 mg 1 tablet orally once a day.  8.  Finasteride 5 mg oral tablet 1 tablet orally once a day at bedtime.  9.  Fish oil capsules, frequency not known.  10. Furosemide 40 mg 1 tablet orally once a day.  11.  Gabapentin 100 mg oral capsule 1 capsule orally once a day.  12.  Glipizide 5 mg 1 tablet orally once a day.  13.  Isosorbide mononitrate extended release 30 mg 1 tablet orally once a day.  14.  Lactobacillus 2 mg orally, unknown frequency.  15.  Lantus 10 units subcutaneous at bedtime.  16.  Levothyroxine 100 mcg 1 capsule orally once a day.  17.  Loperamide 2 mg oral capsule 1 tablet 3 times a day.  18.  Loratadine 10 mg 1 tablet orally once a day.  19.  Metoprolol extended-release 25 mg oral tablet 1 tablet orally once a day.  20.  Omeprazole 20 mg 1 capsule orally once a day.  21.  Rena-Vite vitamin B complex with folic acid 1 tablet orally once a day.  22.  Simvastatin 20 mg 1 tablet orally  once a day.  23.  Zofran 4 mg 1 tablet orally once a day as needed for nausea.   REVIEW OF SYSTEMS: CONSTITUTIONAL: Negative for fever or chills. Positive for fatigue and generalized weakness.  EYES: Negative for blurred vision, double vision. No pain. No redness. No discharge.  EARS, NOSE, AND THROAT: Negative for tinnitus, ear pain, hearing loss, epistaxis, nasal discharge.  RESPIRATORY: Negative for cough, wheezing, dyspnea, hemoptysis, or painful respiration.  CARDIOVASCULAR: Negative for chest pain, palpitations, dizziness, syncopal episodes, orthopnea, dyspnea on exertion. History of chronic pedal edema present.  GASTROINTESTINAL: Negative for nausea, vomiting, diarrhea, constipation, abdominal pain, hematemesis, melena, rectal bleeding.  GENITOURINARY: Negative for dysuria, frequency, urgency. HEMATOLOGIC AND LYMPHATIC: Negative for easy bruising, bleeding, swollen glands.  INTEGUMENTARY: Negative for acne, skin rash, or lesions.  MUSCULOSKELETAL: Negative for neck or back pain. History of gout, stable on chronic medications.  NEUROLOGICAL: Negative for focal weakness, numbness. No history of CVA, TIA, or seizure disorder.  PSYCHIATRIC: Negative for anxiety, insomnia, or depression.   PHYSICAL EXAMINATION:  VITAL SIGNS: Temperature 98.5 degrees Fahrenheit, pulse rate 40 per minute, respirations 16 per minute, blood pressure on arrival 152/48, current blood pressure 130/40, current pulse rate 44 beats per minute, oxygen saturation is 100% on room air.  GENERAL: Well-developed, well-nourished, alert, in no acute distress, comfortably resting in the bed.  HEAD: Atraumatic, normocephalic.  EYES: Pupils equal, react to light and accommodation. No conjunctival pallor. No icterus. Extraocular motions intact.  NOSE: No drainage.  EARS: No drainage.  ORAL CAVITY: No mucosal lesions. No exudates.  NECK: Supple. No JVD. No thyromegaly. No carotid bruit. Range of motion of neck within normal  limits.  RESPIRATORY: Good respiratory effort. Not using accessory muscles of respiration. Bilateral vesicular breath sounds with no rales or rhonchi.  CARDIOVASCULAR: S1, S2 regular. Systolic murmur present. Pulses equal at carotid, femoral, and pedal pulses. Peripheral edema 1+ bilaterally present.  GASTROINTESTINAL: Abdomen soft, nontender. No hepatosplenomegaly. No masses. No rigidity. No guarding. Bowel sounds present and equal in all 4 quadrants.  GENITOURINARY: Deferred.  MUSCULOSKELETAL: No joint tenderness or effusion. Range of motion adequate. Strength and tone equal bilaterally.  SKIN: Inspection within normal limits.  LYMPHATIC: No cervical lymphadenopathy.  VASCULAR: Good dorsalis pedis and posterior tibial pulses.  NEUROLOGIC: Alert, awake, and oriented x 3. Cranial nerves II to XII grossly intact. No sensory deficit. Motor strength 5/5 in both upper and lower extremities. Deep tendon reflexes 2+ bilaterally and symmetrical. Plantars downgoing.  PSYCHIATRIC: Alert, awake, and oriented x 3. Judgment and insight adequate. Memory and mood within normal limits.   ANCILLARY DATA: LABORATORY DATA: Serum glucose 193, BUN 18, creatinine 5.17, sodium 137, potassium 3.8, chloride 94, bicarbonate 31, total bilirubin 0.3, total calcium 9.1. Total protein 5.9, albumin 3.1, alkaline phosphatase 67, AST 19, ALT 8. Troponin 0.03. WBC 8.2, hemoglobin 10.5, hematocrit 32.0, platelet count  143,000.   EKG: Sinus rhythm with ventricular rate of 43 beats per minute, second-degree AV block with 2:1 AV conduction.   ASSESSMENT AND PLAN: A 81 year old Caucasian male with a history of end-stage renal disease on hemodialysis Monday, Wednesday, Friday; hypertension; diabetes mellitus type 2 with neuropathy; hyperlipidemia; hypothyroidism; gout; presents with generalized weakness and bilateral leg weakness, found to have symptomatic bradycardia with second-degree atrioventricular block with 2:1 conduction,  hemodynamically stable. 1.  Second-degree atrioventricular block with 2:1 conduction, symptomatic with general weakness. Plan: Admit to telemetry. Will hold metoprolol. Cycle cardiac enzymes to rule out any acute coronary event. Cardiology consultation requested for further advice.  2.  End-stage renal disease on hemodialysis Monday, Wednesday, and Friday; last hemodialysis on last Friday. Stable clinically. Plan: Nephrology consultation for followup of hemodialysis. 3.  Hypertension, stable on home medications. Will hold off metoprolol for now because of bradycardia.  4.  Diabetes mellitus. Stable. Continue home medications along with sliding scale insulin.  5.  Hyperlipidemia, on statin. Stable. Continue same.  6.  Hypothyroidism, on Levoxyl. Stable clinically. Continue Levoxyl. Check TSH in view of bradycardia.  7.  Status post cardiac arrest secondary to hyperkalemia in March 2016, stable now. Potassium within normal limits.  8.  Deep vein thrombosis prophylaxis. Subcutaneous heparin.  9.  Gastrointestinal prophylaxis. Proton pump inhibitor.   CODE STATUS: Full code.   TIME SPENT: 50 minutes.   ____________________________ Crissie FiguresEdavally N. Janifer Gieselman, MD enr:ST D: 08/15/2014 01:16:50 ET T: 08/15/2014 01:47:04 ET JOB#: [id number]  cc: Crissie FiguresEdavally N. Londin Antone, MD, <Dictator> Marisue IvanKanhka Linthavong, MD Yevonne PaxSaadat A. Khan, MD Crissie FiguresEDAVALLY N Shavona Gunderman MD ELECTRONICALLY SIGNED 08/15/2014 22:05

## 2014-08-29 NOTE — H&P (Signed)
PATIENT NAME:  Johnathan LabellaSAUL, Miquel [name] MR#:  [id number] DATE OF BIRTH:  April 01, 1923  DATE OF ADMISSION:  06/21/2014  PRIMARY CARE PHYSICIAN:  Marisue IvanKanhka Linthavong, MD   CARDIOLOGIST: Adrian BlackwaterShaukat Khan, MD.  [facility]  [facility] [place]  CHIEF COMPLAINT: "I passed out."   HISTORY OF PRESENT ILLNESS: This is a 81 year old man with end-stage renal disease on hemodialysis Monday, Wednesday, Friday, diabetes, hypertension, and hyperlipidemia.  He felt well this morning, but a little nauseated. He had the transport over to dialysis and he was walking. In where he passed out and ended up on the floor. He does have some chest pain when he moves around, but none while he is just sitting there in the bed at rest, chest pain when he moves can go as high as 6/10 in intensity, described as an ache, but when he is just there at rest. No pain whatsoever.  They did not do dialysis at [facility] since he passed out. They sent him over to the ER for further evaluation.  In the ER, he was found to have a potassium of 7 and hospitalist services were contacted for further evaluation and stat medications were ordered by the ER physician. When I was speaking with the patient, he felt a little swimmy-headed and as I was I going through the rest of my history and physical, I heard a beep on the monitor.  I looked up at the monitor. His heart rate was registered at zero. I looked back at the patient and his eyes rolled back into his head and he went unresponsive. I called out to the ER physician Dr. Shaune PollackLord to come on in.  I checked for pulse and was unable to feel a pulse.  CPR was started by myself. Nursing staff was trying to get the oxygen ready to bag him, 1 epinephrine was given, 2 sodium bicarbonate was given, and 1 calcium was given. The patient regained a heartbeat and blood pressure and actually regained responsiveness after CPR was done and the patient did not need to be intubated.    There were no Intensive Care Unit beds available at this  time.  Urgent dialysis was needed. I spoke with Dr. Thedore MinsSingh. He will take him downstairs for dialysis. I put in telemetry orders. Urgent dialysis needed for the arrhythmia with hyperkalemia. The patient did follow some commands post resuscitation.   PAST MEDICAL HISTORY: End[facility]stage [facility] [address], diabetes, hypertension, hyperlipidemia, either asthma or chronic obstructive pulmonary disease, hypothyroidism, gastroesophageal reflux disease.   PAST SURGICAL HISTORY: Hernia repair, gallbladder surgery, left arm dialysis access.   ALLERGIES: LISINOPRIL.   MEDICATIONS: As per prescription writer include acetaminophen 325 mg 2 tablets every 4 hours as needed for pain, DuoNeb nebulizer solution 3 mL 4 times a day as needed, allopurinol 100 mg in the morning, Artificial Tears 1 drop affected eye twice a day, aspirin 81 mg daily, calcium acetate 667 mg 1 capsule twice a day as needed with snacks, calcium acetate 667 mg 2 tablets 3 times a day with meals, Sensipar 30 mg once a day, Combivent 2 puffs 4 times a day as needed, finasteride 5 mg daily, fish oil 50 mg twice a day, Flonase nasal spray 2 puffs twice a day, furosemide 40 mg in the morning, glipizide 5 mg in the morning, Lantus 50 mg subcutaneous injection at bedtime, levothyroxine 50 mcg in the morning, loratadine 10 mg in the morning, metoprolol 100 mg once a day  extended-release, nitroglycerin 0.4 mg once every 5 minutes as needed, omeprazole 40 mg twice a day, probiotic formula 2 capsules twice a day, simvastatin 10 mg at bedtime.   SOCIAL HISTORY: Quit smoking over 40 years ago. No alcohol. No drug use. Retired [employer].   FAMILY HISTORY: Mother had cancer.  As I was asking him about his father's health history, that code arose.   REVIEW OF SYSTEMS:  Unable to obtain while coding the patient.   PHYSICAL EXAMINATION:  VITAL SIGNS: On presentation to the ER  DICTATE  END HERE      ____________________________ Herschell Dimes. Renae Gloss, MD rjw:DT [name]: 06/21/2014 12:46:12 ET T: 06/21/2014 13:29:46 ET JOB#: [id number]  cc: Herschell Dimes. Renae Gloss, MD, <Dictator> Marisue Ivan, MD Laurier Nancy, MD [facility] Nephrology [facility]     Salley Scarlet MD ELECTRONICALLY SIGNED 06/22/2014 16:40

## 2014-08-29 NOTE — Op Note (Signed)
PATIENT NAME:  [name], [name] MR#:  [id number] DATE OF BIRTH:  October 28, 1922  DATE OF PROCEDURE:  08/17/2014  PREOPERATIVE DIAGNOSIS:   1.  2:1 block with junctional bradycardia with syncope.  2.  Dialysis.   ATTENDING PHYSICIAN:  Neoma Laming, M.D.  IMPLANTING PHYSICIAN:  Cletis Athens, M.D.   ACCESSORY CLINICAL DATA:   Pacemaker model Medtronic [place], [id number], serial [id number].  Ventricular lead Medtronic Model [id number].  Ventricular lead Serial  [id number].  Lead implantation date 08/17/2014.  Lower rate 60, upper rate 120,  sensing polarity bipolar, pacing polarity bipolar, impedance 4000 ohms, impedance 204,000 ohms maximum, minimal 200 ohms.    PROCEDURE NOTE:  The patient was taken to the operating room and the left upper chest was prepared with Hibiclens and then it was draped.  The cutdown was made on the left side on the site of the left pacemaker groove.  The cephalic vein was dissected out.  Cephalic vein looked  good and the cutdown was made of the cephalic vein,  but attempt to introduce the lead from the cephalic vein failed at multiple attempts even with guidewire and with an introducer kit.  The  lead could not be introduced into the left subclavian vein, so the procedure was aborted and an attempt was made with a left subclavian stick to enter into the left subclavian vein after multiple attempts.  The left subclavian vein could not be entered, so that procedure was aborted.   Next, cephalic vein, both upper end and lower end were tied up and pacemaker pocket was closed.  Next, the patient was prepared again on the right side and after complete change of gown, mask and scrubbing, right subclavian vein was accessed on the first pass through the introducer kit.  The ventricular pacemaker leads were introduced and it was positioned at the floor of the right ventricle.  It was attached to the pectoralis major muscle and next, a pocket was created under the superficial fascia.  All bleeding  points were cauterized and then the pacemaker was put in.  Pacemaker was connected with the lead and all connections were tightened securely.  Pacemaker was found to be functioning well.  Subcutaneous tissue and skin was closed separately.  Postoperative chest x-ray did not reveal any evidence of hemopneumothorax.  The family was notified about the patient's condition and the patient was sent to recovery room in a stable condition.    ____________________________ Cletis Athens, MD [id number] D: 08/17/2014 16:22:28 ET T: 08/17/2014 17:12:06 ET JOB#: [id number]  cc: Cletis Athens, MD, <Dictator> Cletis Athens MD ELECTRONICALLY SIGNED 08/21/2014 14:30

## 2014-08-29 NOTE — Consult Note (Signed)
PATIENT NAME:  Johnathan LabellaSAUL, Camauri D MR#:  [id number] DATE OF BIRTH:  10/07/1922  DATE OF CONSULTATION:  08/15/2014  REFERRING PHYSICIAN:   CONSULTING PHYSICIAN:  Alinda SierrasEileen A. Atreus Hasz, PA-C  INDICATION FOR CONSULT: Symptomatic bradycardia with second-degree AV block.   HISTORY OF PRESENT ILLNESS: This is a 81 year old, Caucasian male, well known to our practice. History of coronary artery disease, end-stage renal failure on hemodialysis, hypertension, hypothyroidism, hyperlipidemia and gout. The patient was seen in our office last week with a heart rate around 50. Metoprolol was cut in half, at that time. The patient complains of continued weakness with episode of legs giving-way yesterday, which prompted him to go to the ER yesterday. He was found to have 2:1 AV conduction with second-degree AV block in the ER; thus, the patient was admitted and cardiology was consulted.   PAST MEDICAL HISTORY: Coronary artery disease, hypertension, hyperlipidemia, hypothyroidism, diabetes mellitus 2, end-stage renal disease on hemodialysis, Monday, Wednesday, Friday.   ALLERGIES: LISINOPRIL.   FAMILY HISTORY: Positive for diabetes mellitus.   SOCIAL HISTORY: He quit smoking many years ago. No alcohol or other illicit drug use.   REVIEW OF SYSTEMS:  CONSTITUTIONAL: Negative for malaise, but positive for fatigue and weakness.  RESPIRATORY: Negative for shortness of breath, cough, or wheezing.  CARDIOVASCULAR: Negative for chest pain, palpitations, orthopnea, or PND.  GASTROINTESTINAL: No nausea, vomiting, or diarrhea.   PHYSICAL EXAMINATION: VITAL SIGNS: Temperature 97.2, pulse 42, respirations 20, blood pressure 188/70, pulse oximetry 93% saturation on room air.  GENERAL: A and O x 3, in no acute distress.  RESPIRATORY: Good air entry bilaterally. No wheezes, rhonchi, or rales.  CARDIOVASCULAR: Bradycardiac, normal S1, S2.  ABDOMEN: Soft, nontender. Positive bowel sounds.  EXTREMITIES: Pedal edema bilaterally.    LABORATORY DATA: Creatinine 5.17. Troponin 0.03, 0.03, 0.04. WBC 6.7.   EKG shows sinus bradycardia 43 beats per minute, second-degree AV block, 2:1 conduction, old inferior infarct.   ASSESSMENT AND PLAN: Symptomatic bradycardia and second-degree atrioventricular block, advise holding metoprolol and the patient's home amiodarone. We will consult Dr. Juel BurrowMasoud for consideration of permanent pacemaker placement, but would also like to see how the patient does with washing out of beta blocker and amiodarone. We will continue to follow.   Thank you very much for this consult.    ____________________________ Alinda SierrasEileen A. Jarold Mottoomano, PA-C ear:JT D: 08/15/2014 09:39:38 ET T: 08/15/2014 10:05:40 ET JOB#: [id number]  cc: Marjean DonnaEileen A. Margarito Courseromano, PA-C, <Dictator> Alinda SierrasEILEEN A Shephanie Romas PA ELECTRONICALLY SIGNED 08/27/2014 15:02

## 2014-08-29 NOTE — Consult Note (Signed)
PATIENT NAME:  Johnathan FailSAUL [name], Yobany D MR#:  [id number] DATE OF BIRTH:  [birth year]  DATE OF CONSULTATION:  06/22/2014  REFERRING PHYSICIAN:   CONSULTING PHYSICIAN:  Alinda SierrasEileen A. Jarold Mottoomano, PA-C  INDICATION FOR CONSULT:  Cardiac arrest.   HISTORY OF PRESENT ILLNESS: This is a 81 year old Caucasian male, well known to our practice with a past medical history of coronary artery disease, diabetes mellitus, end-stage renal disease on dialysis Monday, Wednesday, Friday who presented to the ER after having collapsed prior to dialysis.  He had subsequent cardiac arrest in the ER and potassium was found to be 7.0, thus urgent hemodialysis was performed.  The patient denies any chest pain yesterday or during this episode, today he admits to chest soreness secondary to chest compressions.   PAST MEDICAL HISTORY: Diabetes mellitus, hypertension, anemia of chronic disease, coronary artery disease, end-stage renal disease on hemodialysis.   ALLERGIES: LISINOPRIL.   SOCIAL HISTORY: No EtOH, tobacco, or other drug use.   FAMILY HISTORY: Positive for cancer in his mother.   REVIEW OF SYSTEMS:  GENERAL: Feeling much better, sitting up eating breakfast in bed.  CHEST:  Tender to palpation and sore secondary to chest compressions.  PULMONARY: No of shortness of breath or wheezing or cough.  ABDOMEN: No nausea, vomiting, or abdominal pain.   PHYSICAL EXAMINATION:  VITAL SIGNS: Temperature 98.2, respirations 20, pulse 81, blood pressure 153/77, pulse oximetry 96% saturation on 2 liters.  GENERAL: A and O x 3 in no acute distress.  CARDIOVASCULAR: Normal S1, S2. No audible murmur, no JVD or carotid bruits.  PULMONARY: Good air entry bilaterally. No wheezes, rhonchi or rales.  ABDOMEN: Soft, nontender. Positive bowel sounds.  EXTREMITIES: No pedal edema.   LABORATORY DATA: Creatinine 8.34, potassium first set was 7, second set at 5.4.  Troponin first set 0.15, second set 0.27, third set 0.32.  EKG shows normal sinus  rhythm, 74 beats per minute, borderline first degree AV block, old inferior infarct, left atrial enlargement, right bundle branch block.   ASSESSMENT AND PLAN:  1.  Asystole secondary to hyperkalemia. Resolved.  Potassium improving.  2.  Mildly elevated troponin could be secondary to demand ischemia, but with the patient's history of coronary artery disease, we will schedule cardiac catheterization for today and check echocardiogram for ejection fraction and wall motion.  We will continue to follow.   Thank you very much for this consult.     ____________________________ Alinda SierrasEileen A. Jarold Mottoomano, PA-C ear:DT D: 06/22/2014 08:49:59 ET T: 06/22/2014 09:22:47 ET JOB#: [id number]  cc: Marjean DonnaEileen A. Margarito Courseromano, PA-C, <Dictator> Alinda SierrasEILEEN A Oisin Yoakum PA ELECTRONICALLY SIGNED 06/30/2014 14:54

## 2015-01-24 ENCOUNTER — Other Ambulatory Visit: Payer: Self-pay | Admitting: Vascular Surgery

## 2015-01-25 ENCOUNTER — Encounter: Payer: Self-pay | Admitting: *Deleted

## 2015-01-25 ENCOUNTER — Encounter
Admission: RE | Disposition: A | Discharge status: Home / Self Care | Payer: Self-pay | Source: Ambulatory Visit | Attending: Vascular Surgery

## 2015-01-25 ENCOUNTER — Ambulatory Visit
Admission: RE | Admit: 2015-01-25 | Discharge: 2015-01-25 | Disposition: A | Discharge status: Home / Self Care | Payer: Medicare Other | Source: Ambulatory Visit | Attending: Vascular Surgery | Admitting: Vascular Surgery

## 2015-01-25 DIAGNOSIS — E785 Hyperlipidemia, unspecified: Secondary | ICD-10-CM | POA: Diagnosis not present

## 2015-01-25 DIAGNOSIS — Z992 Dependence on renal dialysis: Secondary | ICD-10-CM | POA: Insufficient documentation

## 2015-01-25 DIAGNOSIS — Z794 Long term (current) use of insulin: Secondary | ICD-10-CM | POA: Diagnosis not present

## 2015-01-25 DIAGNOSIS — K219 Gastro-esophageal reflux disease without esophagitis: Secondary | ICD-10-CM | POA: Diagnosis not present

## 2015-01-25 DIAGNOSIS — E780 Pure hypercholesterolemia: Secondary | ICD-10-CM | POA: Diagnosis not present

## 2015-01-25 DIAGNOSIS — Y832 Surgical operation with anastomosis, bypass or graft as the cause of abnormal reaction of the patient, or of later complication, without mention of misadventure at the time of the procedure: Secondary | ICD-10-CM | POA: Insufficient documentation

## 2015-01-25 DIAGNOSIS — M1990 Unspecified osteoarthritis, unspecified site: Secondary | ICD-10-CM | POA: Insufficient documentation

## 2015-01-25 DIAGNOSIS — I2510 Atherosclerotic heart disease of native coronary artery without angina pectoris: Secondary | ICD-10-CM | POA: Diagnosis not present

## 2015-01-25 DIAGNOSIS — N186 End stage renal disease: Secondary | ICD-10-CM | POA: Diagnosis not present

## 2015-01-25 DIAGNOSIS — T82858A Stenosis of vascular prosthetic devices, implants and grafts, initial encounter: Secondary | ICD-10-CM | POA: Diagnosis present

## 2015-01-25 DIAGNOSIS — I120 Hypertensive chronic kidney disease with stage 5 chronic kidney disease or end stage renal disease: Secondary | ICD-10-CM | POA: Insufficient documentation

## 2015-01-25 DIAGNOSIS — E669 Obesity, unspecified: Secondary | ICD-10-CM | POA: Diagnosis not present

## 2015-01-25 DIAGNOSIS — E119 Type 2 diabetes mellitus without complications: Secondary | ICD-10-CM | POA: Insufficient documentation

## 2015-01-25 DIAGNOSIS — E039 Hypothyroidism, unspecified: Secondary | ICD-10-CM | POA: Diagnosis not present

## 2015-01-25 DIAGNOSIS — M109 Gout, unspecified: Secondary | ICD-10-CM | POA: Insufficient documentation

## 2015-01-25 DIAGNOSIS — Z87891 Personal history of nicotine dependence: Secondary | ICD-10-CM | POA: Diagnosis not present

## 2015-01-25 DIAGNOSIS — G4730 Sleep apnea, unspecified: Secondary | ICD-10-CM | POA: Diagnosis not present

## 2015-01-25 DIAGNOSIS — J40 Bronchitis, not specified as acute or chronic: Secondary | ICD-10-CM | POA: Insufficient documentation

## 2015-01-25 DIAGNOSIS — Z6835 Body mass index (BMI) 35.0-35.9, adult: Secondary | ICD-10-CM | POA: Diagnosis not present

## 2015-01-25 HISTORY — DX: Pure hypercholesterolemia, unspecified: E78.00

## 2015-01-25 HISTORY — DX: Hyperlipidemia, unspecified: E78.5

## 2015-01-25 HISTORY — DX: Gout, unspecified: M10.9

## 2015-01-25 HISTORY — DX: Hypothyroidism, unspecified: E03.9

## 2015-01-25 HISTORY — DX: Obesity, unspecified: E66.9

## 2015-01-25 HISTORY — PX: PERIPHERAL VASCULAR CATHETERIZATION: SHX172C

## 2015-01-25 HISTORY — DX: Gastro-esophageal reflux disease without esophagitis: K21.9

## 2015-01-25 HISTORY — DX: Atherosclerotic heart disease of native coronary artery without angina pectoris: I25.10

## 2015-01-25 HISTORY — DX: Essential (primary) hypertension: I10

## 2015-01-25 HISTORY — DX: Type 2 diabetes mellitus without complications: E11.9

## 2015-01-25 HISTORY — DX: Sleep apnea, unspecified: G47.30

## 2015-01-25 HISTORY — DX: Bronchitis, not specified as acute or chronic: J40

## 2015-01-25 HISTORY — DX: Chronic kidney disease, unspecified: N18.9

## 2015-01-25 HISTORY — DX: Unspecified osteoarthritis, unspecified site: M19.90

## 2015-01-25 HISTORY — DX: Zoster without complications: B02.9

## 2015-01-25 LAB — POTASSIUM (ARMC VASCULAR LAB ONLY): Potassium (ARMC vascular lab): 4

## 2015-01-25 SURGERY — A/V SHUNTOGRAM/FISTULAGRAM
Anesthesia: Moderate Sedation

## 2015-01-25 MED ORDER — CHLORHEXIDINE GLUCONATE CLOTH 2 % EX PADS: 6.0000 | MEDICATED_PAD | Freq: Once | CUTANEOUS | Status: DC

## 2015-01-25 MED ORDER — HEPARIN SODIUM (PORCINE) 1000 UNIT/ML IJ SOLN
INTRAMUSCULAR | Status: DC | PRN
  Administered 2015-01-25: 3000 [IU] via INTRAVENOUS

## 2015-01-25 MED ORDER — FENTANYL CITRATE (PF) 100 MCG/2ML IJ SOLN
INTRAMUSCULAR | Status: DC | PRN
  Administered 2015-01-25: 50 ug via INTRAVENOUS

## 2015-01-25 MED ORDER — CEFUROXIME SODIUM 1.5 G IJ SOLR
1.5000 g | INTRAMUSCULAR | Status: AC
  Administered 2015-01-25: 1.5 g via INTRAVENOUS

## 2015-01-25 MED ORDER — INSULIN ASPART 100 UNIT/ML ~~LOC~~ SOLN: 0.0000 [IU] | SUBCUTANEOUS | Status: DC

## 2015-01-25 MED ORDER — HEPARIN (PORCINE) IN NACL 2-0.9 UNIT/ML-% IJ SOLN
INTRAMUSCULAR | Status: AC
Start: 2015-01-25 — End: 2015-01-25
  Filled 2015-01-25: qty 1000

## 2015-01-25 MED ORDER — FENTANYL CITRATE (PF) 100 MCG/2ML IJ SOLN
INTRAMUSCULAR | Status: AC
  Filled 2015-01-25: qty 2

## 2015-01-25 MED ORDER — IOHEXOL 300 MG/ML  SOLN
INTRAMUSCULAR | Status: DC | PRN
  Administered 2015-01-25: 25 mL via INTRAVENOUS

## 2015-01-25 MED ORDER — MIDAZOLAM HCL 5 MG/5ML IJ SOLN
INTRAMUSCULAR | Status: AC
  Filled 2015-01-25: qty 5

## 2015-01-25 MED ORDER — SODIUM CHLORIDE 0.9 % IV SOLN
INTRAVENOUS | Status: DC
  Administered 2015-01-25: 15:00:00 via INTRAVENOUS

## 2015-01-25 MED ORDER — HEPARIN SODIUM (PORCINE) 1000 UNIT/ML IJ SOLN
INTRAMUSCULAR | Status: AC
  Filled 2015-01-25: qty 1

## 2015-01-25 MED ORDER — CEFUROXIME SODIUM 1.5 G IJ SOLR
INTRAMUSCULAR | Status: AC
  Administered 2015-01-25: 1.5 g via INTRAVENOUS
  Filled 2015-01-25: qty 1.5

## 2015-01-25 MED ORDER — MIDAZOLAM HCL 2 MG/2ML IJ SOLN
INTRAMUSCULAR | Status: DC | PRN
  Administered 2015-01-25: 2 mg via INTRAVENOUS

## 2015-01-25 SURGICAL SUPPLY — 15 items
BALLN DORADO 8X20X80 (BALLOONS) ×4
BALLN DORADO 8X60X80 (BALLOONS) ×4
BALLOON DORADO 8X20X80 (BALLOONS) ×2 IMPLANT
BALLOON DORADO 8X60X80 (BALLOONS) ×2 IMPLANT
DEVICE PRESTO INFLATION (MISCELLANEOUS) ×4 IMPLANT
DRAPE BRACHIAL (DRAPES) ×4 IMPLANT
PACK ANGIOGRAPHY (CUSTOM PROCEDURE TRAY) ×4 IMPLANT
SET INTRO CAPELLA COAXIAL (SET/KITS/TRAYS/PACK) ×4 IMPLANT
SHEATH BRITE TIP 6FRX5.5 (SHEATH) ×4 IMPLANT
SHEATH BRITE TIP 7FRX5.5 (SHEATH) ×4 IMPLANT
STENT VIABAHN 8X100X120 (Permanent Stent) ×2 IMPLANT
STENT VIABAHN 8X10X120 (Permanent Stent) ×2 IMPLANT
SUT MNCRL AB 4-0 PS2 18 (SUTURE) ×4 IMPLANT
TOWEL OR 17X26 4PK STRL BLUE (TOWEL DISPOSABLE) ×4 IMPLANT
WIRE G V18X300CM (WIRE) ×4 IMPLANT

## 2015-01-25 NOTE — H&P (Signed)
[facility] Admission History & Physical  MRN : [id number]  [name] Sr. is a 81 y.o. (01-04-1923) male who presents with chief complaint of problem with my dialysis access.  History of Present Illness: Patient is been sent by the dialysis center secondary to difficulty with cannulation and poor dialysis flow from his arm access. He denies fever chills while on dialysis. He denies arm pain. No history of missing any dialysis sessions.  Current Facility-Administered Medications  Medication Dose Route Frequency Provider Last Rate Last Dose  . 0.9 %  sodium chloride infusion   Intravenous Continuous Kimberly A Stegmayer, PA-C 10 mL/hr at 01/25/15 1500    . cefUROXime (ZINACEF) 1.5 g in dextrose 5 % 50 mL IVPB  1.5 g Intravenous 30 min Pre-Op Ranae Plumber Stegmayer, PA-C      . Chlorhexidine Gluconate Cloth 2 % PADS 6 each  6 each Topical Once [employer], PA-C      . dextrose 5 % with cefUROXime (ZINACEF) ADS Med           . insulin aspart (novoLOG) injection 0-24 Units  0-24 Units Subcutaneous 6 times per day Tonette Lederer, PA-C        Past Medical History  Diagnosis Date  . Chronic kidney disease   . Obesity   . Hyperlipidemia   . Hypertension   . Diabetes mellitus without complication   . Hypothyroidism   . Coronary artery disease   . Arthritis   . Gout   . Hypercholesterolemia   . Sleep apnea   . Bronchitis   . GERD (gastroesophageal reflux disease)   . Shingles     Past Surgical History  Procedure Laterality Date  . Cholecystectomy    . Hernia repair    . Eye surgery      Social History Social History  Substance Use Topics  . Smoking status: Former Smoker -- 2.00 packs/day for 15 years  . Smokeless tobacco: Not on file  . Alcohol Use: No    Family History No family history of porphyria, autoimmune disease or bleeding clotting disorders  Allergies  Allergen Reactions  . Cinacalcet Other (See Comments)  . Lisinopril  Cough     REVIEW OF SYSTEMS (Negative unless checked)  Constitutional: Weight loss  Fever  Chills Cardiac: Chest pain   Chest pressure   Palpitations   Shortness of breath when laying flat   Shortness of breath at rest   Shortness of breath with exertion. Vascular:  Pain in legs with walking   Pain in legs at rest   Pain in legs when laying flat   Claudication   Pain in feet when walking  Pain in feet at rest  Pain in feet when laying flat   History of DVT   Phlebitis   Swelling in legs   Varicose veins   Non-healing ulcers Pulmonary:   Uses home oxygen   Productive cough   Hemoptysis   Wheeze  COPD   Asthma Neurologic:  Dizziness  Blackouts   Seizures   History of stroke   History of TIA  Aphasia   Temporary blindness   Dysphagia   Weakness or numbness in arms   Weakness or numbness in legs Musculoskeletal:  Arthritis   Joint swelling   Joint pain   Low back pain Hematologic:  Easy bruising  Easy bleeding   Hypercoagulable state   Anemic  Hepatitis Gastrointestinal:  Blood in stool   Vomiting blood    Gastroesophageal reflux/heartburn   Difficulty swallowing. Genitourinary:  Chronic kidney disease   Difficult urination  Frequent urination  Burning with urination   Blood in urine Skin:  Rashes   Ulcers   Wounds Psychological:  History of anxiety    History of major depression.  Physical Examination  Filed Vitals:   01/25/15 1448 01/25/15 1449  BP:  117/59  Pulse: 89 87  Resp:  18  Height:   (1.575 m)  Weight:  87.544 kg (193 lb)  SpO2:  97%   Body mass index is 35.29 kg/(m^2). Gen: WD/WN, NAD Head: [place]/AT, No temporalis wasting.  Ear/Nose/Throat: Hearing grossly intact, nares w/o erythema or drainage, oropharynx w/o Erythema/Exudate, Eyes: PERRLA, EOMI.  Neck: Supple, no nuchal rigidity.  No bruit or JVD.  Pulmonary:  Good air movement, clear to auscultation  bilaterally, no increased work of respiration or use of accessory muscles  Cardiac: RRR, normal S1, S2, no Murmurs, rubs or gallops. Vascular: AV fistula with marked pulsatility no ulcerations no erythema no drainage Gastrointestinal: soft, non-tender/non-distended. No guarding/reflex. No masses, surgical incisions, or scars. Musculoskeletal: M/S 5/5 throughout.  No deformity or atrophy. Neurologic: CN 2-12 intact. Pain and light touch intact in extremities.  Symmetrical.  Speech is fluent. Motor exam as listed above. Psychiatric: Judgment intact, Mood & affect appropriate for pt's clinical situation. Dermatologic: No rashes or ulcers noted.  No cellulitis or open wounds. Lymph : No Cervical, Axillary, or Inguinal lymphadenopathy.   CBC Lab Results  Component Value Date   WBC 7.0 08/17/2014   HGB 10.7* 08/18/2014   HCT 33.3* 08/17/2014   MCV 107* 08/17/2014   PLT 140* 08/17/2014    BMET    Component Value Date/Time   NA 137 08/16/2014 1056   K 4.2 08/16/2014 1056   CL 95* 08/16/2014 1056   CO2 31 08/16/2014 1056   GLUCOSE 241* 08/16/2014 1056   BUN 33* 08/16/2014 1056   CREATININE 7.40* 08/16/2014 1056   CALCIUM 8.6* 08/16/2014 1056   GFRNONAA 6* 08/16/2014 1056   GFRNONAA 8* 04/16/2014 0424   GFRAA 7* 08/16/2014 1056   GFRAA 10* 04/16/2014 0424   CrCl cannot be calculated (Patient has no serum creatinine result on file.).  COAG Lab Results  Component Value Date   INR 1.0 08/17/2014   INR 1.0 05/10/2013    Radiology No results found.  Assessment/Plan 1.  End-stage renal disease requiring hemodialysis: Patient is having problems maintaining his appropriate dialysis parameters secondary to problems with his AV access. Risks and benefits for angiography with the hope for intervention of been reviewed. The patient has agreed to proceed. He will undergo angiography for diagnostic purposes and treatment of any abnormalities so that he may continue his dialysis  uninterrupted.   Schnier, Latina Craver, MD  01/25/2015 3:35 PM

## 2015-01-25 NOTE — Discharge Instructions (Signed)
fThe drugs you were given will stay in your system until tomorrow, so for the next 24 hours you should not.  Drive an automobile. Make any legal decisions.  Drink any alcoholic beverages.  Today you should start with liquids and gradually work up to solid foods as your are able to tolerate them  Resume your regular medications as prescribed by your doctor.  Avoid aspirin for 24 hours.    Change the Band-Aid or dressing as needed.  After a 2 days no dressing as needed.  Avoid strenuous activity for the remainder of the day.  Please notify your primary physician immediately if you have any unusual bleeding, trouble breathing, fever >100 degrees or pain not relieved by the medication your doctor prescribed for your doctor prescribed for you physician  Return to diaslysis  tommorow.

## 2015-01-25 NOTE — OR Nursing (Signed)
+   bruit in dialysis site

## 2015-01-27 ENCOUNTER — Encounter: Payer: Self-pay | Admitting: Vascular Surgery

## 2015-01-27 NOTE — Op Note (Signed)
OPERATIVE NOTE   PROCEDURE: 1. Contrast injection of left radial to basilic AV graft 2. Percutaneous transluminal angioplasty and stent placement venous portion left forearm AV graft  PRE-OPERATIVE DIAGNOSIS: Complication of dialysis access  End Stage Renal Disease  POST-OPERATIVE DIAGNOSIS: same as above   SURGEON: Katha Cabal, M.D.  ANESTHESIA: Conscious Sedation   ESTIMATED BLOOD LOSS: minimal  FINDING(S): 1. String sign noted venous anastomosis with multiple high-grade stenoses within the venous portion of the AV graft itself  SPECIMEN(S): None  CONTRAST: 25 cc  FLUOROSCOPY TIME: 2.2 minutes  INDICATIONS: The patient is Mr [name] who is a 81 yo male who presents with malfunctioning left radial to basilic AV access. The patient is scheduled for angiography with possible intervention of the AV access. The patient is aware the risks include but are not limited to: bleeding, infection, thrombosis of the cannulated access, and possible anaphylactic reaction to the contrast. The patient acknowledges if the access can not be salvaged a tunneled catheter will be needed and will be placed during this procedure. The patient is aware of the risks of the procedure and elects to proceed with the angiogram and intervention.  DESCRIPTION: After full informed written consent was obtained, the patient was brought back to the Special Procedure suite and placed supine position. Appropriate cardiopulmonary monitors were placed. The left arm was prepped and draped in the standard fashion. Appropriate timeout is called. The ultrasound was placed in a sterile sleeve and the graft was evaluated. An appropriate access point near the arterial anastomosis was selected and an image was recorded for the permanent record. The left radial basilic AV graft was cannulated with a micropuncture needle under direct ultrasound  visualization. The microwire was advanced and the needle was exchanged for a microsheath. The J-wire was then advanced and a 6 Fr sheath inserted. Hand injections were completed to image the access from the arterial anastomosis through the entire access.   Based on the images, 3000 units of heparin was given and the sheath was upsized to a 7 [place] sheath over the J-wire. A VAT wire was then negotiated through the strictures and an 8 x 100 Viabahn stent deployed across all the lesions. Subsequently it was postdilated with an 8x6 Dorado balloon with maximal inflations to 20 atm.. 2 areas which demonstrated residual stenosis were then dilated with an 8 x 2 Dorado balloon again to 20 atm. Follow-up imaging demonstrated an excellent result with good wall apposition of the stent and a smooth contour. There is complete resolution of the previous strictures. Outflow was preserved.   A 4-0 Monocryl purse-string suture was sewn around the sheath. The sheath was removed and light pressure was applied. A sterile bandage was applied to the puncture site.    COMPLICATIONS: None  CONDITION: Carlynn Purl, M.D [facility] Office: [phone]  01/25/2015 4:24 PM

## 2015-01-28 ENCOUNTER — Other Ambulatory Visit: Payer: Self-pay | Admitting: Physician Assistant

## 2015-01-28 DIAGNOSIS — R911 Solitary pulmonary nodule: Secondary | ICD-10-CM

## 2015-02-03 ENCOUNTER — Ambulatory Visit
Admission: RE | Admit: 2015-02-03 | Discharge: 2015-02-03 | Disposition: A | Discharge status: Home / Self Care | Payer: Medicare Other | Source: Ambulatory Visit | Attending: Physician Assistant | Admitting: Physician Assistant

## 2015-02-03 DIAGNOSIS — I2510 Atherosclerotic heart disease of native coronary artery without angina pectoris: Secondary | ICD-10-CM | POA: Insufficient documentation

## 2015-02-03 DIAGNOSIS — R911 Solitary pulmonary nodule: Secondary | ICD-10-CM | POA: Diagnosis present

## 2015-02-03 DIAGNOSIS — R918 Other nonspecific abnormal finding of lung field: Secondary | ICD-10-CM | POA: Insufficient documentation

## 2015-02-03 DIAGNOSIS — E042 Nontoxic multinodular goiter: Secondary | ICD-10-CM | POA: Diagnosis not present

## 2015-02-03 DIAGNOSIS — N200 Calculus of kidney: Secondary | ICD-10-CM | POA: Diagnosis not present

## 2015-02-03 MED ORDER — IOHEXOL 300 MG/ML  SOLN
75.0000 mL | Freq: Once | INTRAMUSCULAR | Status: AC | PRN
  Administered 2015-02-03: 75 mL via INTRAVENOUS

## 2015-11-02 ENCOUNTER — Observation Stay
Admission: EM | Admit: 2015-11-02 | Discharge: 2015-11-02 | Disposition: A | Discharge status: Home / Self Care | Payer: Medicare Other | Attending: Internal Medicine | Admitting: Internal Medicine

## 2015-11-02 ENCOUNTER — Encounter: Payer: Self-pay | Admitting: *Deleted

## 2015-11-02 ENCOUNTER — Emergency Department: Payer: Medicare Other

## 2015-11-02 DIAGNOSIS — N186 End stage renal disease: Secondary | ICD-10-CM | POA: Diagnosis not present

## 2015-11-02 DIAGNOSIS — I517 Cardiomegaly: Secondary | ICD-10-CM | POA: Insufficient documentation

## 2015-11-02 DIAGNOSIS — Z8669 Personal history of other diseases of the nervous system and sense organs: Secondary | ICD-10-CM | POA: Diagnosis not present

## 2015-11-02 DIAGNOSIS — R079 Chest pain, unspecified: Principal | ICD-10-CM | POA: Insufficient documentation

## 2015-11-02 DIAGNOSIS — Z87891 Personal history of nicotine dependence: Secondary | ICD-10-CM | POA: Diagnosis not present

## 2015-11-02 DIAGNOSIS — Z950 Presence of cardiac pacemaker: Secondary | ICD-10-CM | POA: Insufficient documentation

## 2015-11-02 DIAGNOSIS — Z7902 Long term (current) use of antithrombotics/antiplatelets: Secondary | ICD-10-CM | POA: Insufficient documentation

## 2015-11-02 DIAGNOSIS — I120 Hypertensive chronic kidney disease with stage 5 chronic kidney disease or end stage renal disease: Secondary | ICD-10-CM | POA: Insufficient documentation

## 2015-11-02 DIAGNOSIS — D631 Anemia in chronic kidney disease: Secondary | ICD-10-CM | POA: Diagnosis not present

## 2015-11-02 DIAGNOSIS — M109 Gout, unspecified: Secondary | ICD-10-CM | POA: Diagnosis not present

## 2015-11-02 DIAGNOSIS — Z9049 Acquired absence of other specified parts of digestive tract: Secondary | ICD-10-CM | POA: Insufficient documentation

## 2015-11-02 DIAGNOSIS — R0789 Other chest pain: Secondary | ICD-10-CM | POA: Diagnosis present

## 2015-11-02 DIAGNOSIS — E1122 Type 2 diabetes mellitus with diabetic chronic kidney disease: Secondary | ICD-10-CM | POA: Diagnosis not present

## 2015-11-02 DIAGNOSIS — Z6831 Body mass index (BMI) 31.0-31.9, adult: Secondary | ICD-10-CM | POA: Diagnosis not present

## 2015-11-02 DIAGNOSIS — I2510 Atherosclerotic heart disease of native coronary artery without angina pectoris: Secondary | ICD-10-CM | POA: Insufficient documentation

## 2015-11-02 DIAGNOSIS — Z888 Allergy status to other drugs, medicaments and biological substances status: Secondary | ICD-10-CM | POA: Diagnosis not present

## 2015-11-02 DIAGNOSIS — I700 Atherosclerosis of aorta: Secondary | ICD-10-CM | POA: Insufficient documentation

## 2015-11-02 DIAGNOSIS — N400 Enlarged prostate without lower urinary tract symptoms: Secondary | ICD-10-CM | POA: Diagnosis not present

## 2015-11-02 DIAGNOSIS — N2581 Secondary hyperparathyroidism of renal origin: Secondary | ICD-10-CM | POA: Diagnosis not present

## 2015-11-02 DIAGNOSIS — I252 Old myocardial infarction: Secondary | ICD-10-CM | POA: Insufficient documentation

## 2015-11-02 DIAGNOSIS — E039 Hypothyroidism, unspecified: Secondary | ICD-10-CM | POA: Diagnosis not present

## 2015-11-02 DIAGNOSIS — K219 Gastro-esophageal reflux disease without esophagitis: Secondary | ICD-10-CM | POA: Diagnosis not present

## 2015-11-02 DIAGNOSIS — I4510 Unspecified right bundle-branch block: Secondary | ICD-10-CM | POA: Diagnosis not present

## 2015-11-02 DIAGNOSIS — Z7982 Long term (current) use of aspirin: Secondary | ICD-10-CM | POA: Diagnosis not present

## 2015-11-02 DIAGNOSIS — Z794 Long term (current) use of insulin: Secondary | ICD-10-CM | POA: Diagnosis not present

## 2015-11-02 DIAGNOSIS — E785 Hyperlipidemia, unspecified: Secondary | ICD-10-CM | POA: Diagnosis not present

## 2015-11-02 DIAGNOSIS — E669 Obesity, unspecified: Secondary | ICD-10-CM | POA: Diagnosis not present

## 2015-11-02 DIAGNOSIS — E7800 Pure hypercholesterolemia, unspecified: Secondary | ICD-10-CM | POA: Insufficient documentation

## 2015-11-02 DIAGNOSIS — G4730 Sleep apnea, unspecified: Secondary | ICD-10-CM | POA: Insufficient documentation

## 2015-11-02 DIAGNOSIS — M1990 Unspecified osteoarthritis, unspecified site: Secondary | ICD-10-CM | POA: Insufficient documentation

## 2015-11-02 DIAGNOSIS — Z992 Dependence on renal dialysis: Secondary | ICD-10-CM | POA: Insufficient documentation

## 2015-11-02 LAB — BASIC METABOLIC PANEL
Anion gap: 13 (ref 5–15)
BUN: 36 mg/dL — AB (ref 6–20)
CALCIUM: 10.6 mg/dL — AB (ref 8.9–10.3)
CO2: 30 mmol/L (ref 22–32)
CREATININE: 6.96 mg/dL — AB (ref 0.61–1.24)
Chloride: 91 mmol/L — ABNORMAL LOW (ref 101–111)
GFR calc Af Amer: 7 mL/min — ABNORMAL LOW (ref >60)
GFR, EST NON AFRICAN AMERICAN: 6 mL/min — AB (ref >60)
GLUCOSE: 189 mg/dL — AB (ref 65–99)
POTASSIUM: 4.2 mmol/L (ref 3.5–5.1)
SODIUM: 134 mmol/L — AB (ref 135–145)

## 2015-11-02 LAB — CBC
HCT: 38.9 % — ABNORMAL LOW (ref 40.0–52.0)
Hemoglobin: 13.5 g/dL (ref 13.0–18.0)
MCH: 36 pg — ABNORMAL HIGH (ref 26.0–34.0)
MCHC: 34.6 g/dL (ref 32.0–36.0)
MCV: 104.1 fL — ABNORMAL HIGH (ref 80.0–100.0)
PLATELETS: 157 10*3/uL (ref 150–440)
RBC: 3.73 MIL/uL — ABNORMAL LOW (ref 4.40–5.90)
RDW: 14.6 % — AB (ref 11.5–14.5)
WBC: 8.4 10*3/uL (ref 3.8–10.6)

## 2015-11-02 LAB — TROPONIN I
TROPONIN I: 0.06 ng/mL — AB (ref <0.03)
Troponin I: 0.08 ng/mL (ref <0.03)
Troponin I: 0.08 ng/mL (ref <0.03)

## 2015-11-02 LAB — TSH: TSH: 0.898 u[IU]/mL (ref 0.350–4.500)

## 2015-11-02 LAB — GLUCOSE, CAPILLARY
Glucose-Capillary: 107 mg/dL — ABNORMAL HIGH (ref 65–99)
Glucose-Capillary: 185 mg/dL — ABNORMAL HIGH (ref 65–99)

## 2015-11-02 LAB — HEMOGLOBIN A1C: Hgb A1c MFr Bld: 7.4 % — ABNORMAL HIGH (ref 4.0–6.0)

## 2015-11-02 LAB — PHOSPHORUS: Phosphorus: 4.6 mg/dL (ref 2.5–4.6)

## 2015-11-02 MED ORDER — HEPARIN SODIUM (PORCINE) 5000 UNIT/ML IJ SOLN: 5000.0000 [IU] | Freq: Three times a day (TID) | INTRAMUSCULAR | Status: DC

## 2015-11-02 MED ORDER — LEVOTHYROXINE SODIUM 100 MCG PO TABS: 100.0000 ug | ORAL_TABLET | Freq: Every day | ORAL | Status: DC

## 2015-11-02 MED ORDER — CLOPIDOGREL BISULFATE 75 MG PO TABS: 75.0000 mg | ORAL_TABLET | Freq: Every day | ORAL | Status: DC

## 2015-11-02 MED ORDER — ONDANSETRON HCL 4 MG/2ML IJ SOLN: 4.0000 mg | Freq: Four times a day (QID) | INTRAMUSCULAR | Status: DC | PRN

## 2015-11-02 MED ORDER — INSULIN GLARGINE 100 UNIT/ML ~~LOC~~ SOLN
6.0000 [IU] | Freq: Every day | SUBCUTANEOUS | Status: DC
  Filled 2015-11-02: qty 0.06

## 2015-11-02 MED ORDER — CALCIUM ACETATE (PHOS BINDER) 667 MG PO CAPS: 1334.0000 mg | ORAL_CAPSULE | Freq: Three times a day (TID) | ORAL | Status: DC

## 2015-11-02 MED ORDER — CLOPIDOGREL BISULFATE 75 MG PO TABS: 300.0000 mg | ORAL_TABLET | Freq: Once | ORAL | Status: DC

## 2015-11-02 MED ORDER — PANTOPRAZOLE SODIUM 40 MG PO TBEC: 40.0000 mg | DELAYED_RELEASE_TABLET | Freq: Two times a day (BID) | ORAL | Status: DC

## 2015-11-02 MED ORDER — GABAPENTIN 100 MG PO CAPS: 100.0000 mg | ORAL_CAPSULE | Freq: Every day | ORAL | Status: DC

## 2015-11-02 MED ORDER — INSULIN ASPART 100 UNIT/ML ~~LOC~~ SOLN: 0.0000 [IU] | Freq: Three times a day (TID) | SUBCUTANEOUS | Status: DC

## 2015-11-02 MED ORDER — DOCUSATE SODIUM 100 MG PO CAPS: 100.0000 mg | ORAL_CAPSULE | ORAL | Status: DC

## 2015-11-02 MED ORDER — LIDOCAINE HCL (PF) 1 % IJ SOLN
5.0000 mL | INTRAMUSCULAR | Status: DC | PRN
  Filled 2015-11-02: qty 5

## 2015-11-02 MED ORDER — ACETAMINOPHEN 650 MG RE SUPP: 650.0000 mg | Freq: Four times a day (QID) | RECTAL | Status: DC | PRN

## 2015-11-02 MED ORDER — SODIUM CHLORIDE 0.9 % IV SOLN: 100.0000 mL | INTRAVENOUS | Status: DC | PRN

## 2015-11-02 MED ORDER — ACETAMINOPHEN 325 MG PO TABS: 650.0000 mg | ORAL_TABLET | Freq: Four times a day (QID) | ORAL | Status: DC | PRN

## 2015-11-02 MED ORDER — ONDANSETRON HCL 4 MG PO TABS: 4.0000 mg | ORAL_TABLET | Freq: Four times a day (QID) | ORAL | Status: DC | PRN

## 2015-11-02 MED ORDER — SODIUM CHLORIDE 0.9% FLUSH: 3.0000 mL | Freq: Two times a day (BID) | INTRAVENOUS | Status: DC

## 2015-11-02 MED ORDER — LORATADINE 10 MG PO TABS: 10.0000 mg | ORAL_TABLET | Freq: Every day | ORAL | Status: DC

## 2015-11-02 MED ORDER — ASPIRIN EC 81 MG PO TBEC: 81.0000 mg | DELAYED_RELEASE_TABLET | Freq: Every day | ORAL | Status: DC

## 2015-11-02 MED ORDER — FLUTICASONE PROPIONATE 50 MCG/ACT NA SUSP
2.0000 | Freq: Every day | NASAL | Status: DC
Start: 2015-11-02 — End: 2015-11-02
  Filled 2015-11-02: qty 16

## 2015-11-02 MED ORDER — LIDOCAINE-PRILOCAINE 2.5-2.5 % EX CREA
1.0000 "application " | TOPICAL_CREAM | CUTANEOUS | Status: DC | PRN
  Filled 2015-11-02: qty 5

## 2015-11-02 MED ORDER — MORPHINE SULFATE (PF) 2 MG/ML IV SOLN: 2.0000 mg | INTRAVENOUS | Status: DC | PRN

## 2015-11-02 MED ORDER — IPRATROPIUM-ALBUTEROL 0.5-2.5 (3) MG/3ML IN SOLN: 3.0000 mL | RESPIRATORY_TRACT | Status: DC | PRN

## 2015-11-02 MED ORDER — CALCIUM ACETATE (PHOS BINDER) 667 MG PO CAPS: 667.0000 mg | ORAL_CAPSULE | Freq: Two times a day (BID) | ORAL | Status: DC | PRN

## 2015-11-02 MED ORDER — HEPARIN SODIUM (PORCINE) 1000 UNIT/ML DIALYSIS
1000.0000 [IU] | INTRAMUSCULAR | Status: DC | PRN
  Filled 2015-11-02: qty 1

## 2015-11-02 MED ORDER — OMEPRAZOLE 40 MG PO CPDR: 40.0000 mg | DELAYED_RELEASE_CAPSULE | Freq: Two times a day (BID) | ORAL | Status: DC

## 2015-11-02 MED ORDER — ALLOPURINOL 100 MG PO TABS: 100.0000 mg | ORAL_TABLET | Freq: Every day | ORAL | Status: DC

## 2015-11-02 MED ORDER — IPRATROPIUM-ALBUTEROL 18-103 MCG/ACT IN AERO: 2.0000 | INHALATION_SPRAY | RESPIRATORY_TRACT | Status: DC | PRN

## 2015-11-02 MED ORDER — FINASTERIDE 5 MG PO TABS: 5.0000 mg | ORAL_TABLET | Freq: Every day | ORAL | Status: DC

## 2015-11-02 MED ORDER — SIMVASTATIN 20 MG PO TABS: 20.0000 mg | ORAL_TABLET | Freq: Every day | ORAL | Status: DC

## 2015-11-02 MED ORDER — FUROSEMIDE 40 MG PO TABS: 40.0000 mg | ORAL_TABLET | Freq: Every day | ORAL | Status: DC

## 2015-11-02 MED ORDER — ISOSORBIDE MONONITRATE ER 30 MG PO TB24: 30.0000 mg | ORAL_TABLET | Freq: Every day | ORAL | Status: DC

## 2015-11-02 MED ORDER — HYPROMELLOSE (GONIOSCOPIC) 2.5 % OP SOLN: 1.0000 [drp] | OPHTHALMIC | Status: DC | PRN

## 2015-11-02 MED ORDER — OMEGA-3-ACID ETHYL ESTERS 1 G PO CAPS: 1.0000 g | ORAL_CAPSULE | Freq: Two times a day (BID) | ORAL | Status: DC

## 2015-11-02 MED ORDER — HYDROCODONE-ACETAMINOPHEN 5-325 MG PO TABS: 1.0000 | ORAL_TABLET | ORAL | Status: DC | PRN

## 2015-11-02 MED ORDER — PANTOPRAZOLE SODIUM 40 MG PO TBEC: 40.0000 mg | DELAYED_RELEASE_TABLET | Freq: Every day | ORAL | Status: DC

## 2015-11-02 MED ORDER — ALTEPLASE 2 MG IJ SOLR: 2.0000 mg | Freq: Once | INTRAMUSCULAR | Status: DC | PRN

## 2015-11-02 MED ORDER — PENTAFLUOROPROP-TETRAFLUOROETH EX AERO
1.0000 "application " | INHALATION_SPRAY | CUTANEOUS | Status: DC | PRN
  Filled 2015-11-02: qty 30

## 2015-11-02 NOTE — Progress Notes (Signed)
HD removed 2 L of oxygen. Room air. A fib. Pt wanted to take meds at home. Son to take home. IV and tele removed. Discharge instructions given to son. Pt has no further concerns at this time.

## 2015-11-02 NOTE — ED Notes (Signed)
Patient transported to X-ray 

## 2015-11-02 NOTE — Progress Notes (Signed)
[facility]  ROUNDING NOTE   Subjective:  Patient admitted with chest pain and very mild troponin elevation. Patient well known to us from prior admissions.    Objective:  Vital signs in last 24 hours:  Temp:  [97.6 F (36.4 C)-98.3 F (36.8 C)] 98.1 F (36.7 C) (07/05 1435) Pulse Rate:  [86-98] 91 (07/05 1530) Resp:  [10-21] 15 (07/05 1600) BP: (107-129)/(57-75) 114/64 mmHg (07/05 1600) SpO2:  [91 %-98 %] 98 % (07/05 1435) Weight:  [87.544 kg (193 lb)-91.3 kg (201 lb 4.5 oz)] 88 kg (194 lb 0.1 oz) (07/05 1435)  Weight change:  Filed Weights   11/02/15 0300 11/02/15 0758 11/02/15 1435  Weight: 91.3 kg (201 lb 4.5 oz) 87.544 kg (193 lb) 88 kg (194 lb 0.1 oz)    Intake/Output:     Intake/Output this shift:  Total I/O In: 240 [P.O.:240] Out: 0   Physical Exam: General: NAD, resting in bed  Head: Normocephalic, atraumatic. Moist oral mucosal membranes  Eyes: Anicteric  Neck: Supple, trachea midline  Lungs:  Clear to auscultation normal effort  Heart: Regular rate and rhythm  Abdomen:  Soft, nontender, BS present  Extremities: trace peripheral edema.  Neurologic: Nonfocal, moving all four extremities  Skin: No lesions  Access: LUE AVG    Basic Metabolic Panel:  Recent Labs Lab 11/02/15 0300 11/02/15 1308  NA 134*  --   K 4.2  --   CL 91*  --   CO2 30  --   GLUCOSE 189*  --   BUN 36*  --   CREATININE 6.96*  --   CALCIUM 10.6*  --   PHOS  --  4.6    Liver Function Tests: No results for input(s): AST, ALT, ALKPHOS, BILITOT, PROT, ALBUMIN in the last 168 hours. No results for input(s): LIPASE, AMYLASE in the last 168 hours. No results for input(s): AMMONIA in the last 168 hours.  CBC:  Recent Labs Lab 11/02/15 0300  WBC 8.4  HGB 13.5  HCT 38.9*  MCV 104.1*  PLT 157    Cardiac Enzymes:  Recent Labs Lab 11/02/15 0300 11/02/15 0835 11/02/15 1308  TROPONINI 0.06* 0.08* 0.08*    BNP: Invalid input(s):  POCBNP  CBG:  Recent Labs Lab 11/02/15 0756 11/02/15 1150  GLUCAP 107* 185*    Microbiology: Results for orders placed or performed during the hospital encounter of 08/14/14  Clostridium Difficile [facility])     Status: None   Collection Time: 08/15/14 10:00 AM  Result Value Ref Range Status   Micro Text Report   Final       C.DIFFICILE ANTIGEN       C.DIFFICILE GDH ANTIGEN : NEGATIVE   C.DIFFICILE TOXIN A/B     C.DIFFICILE TOXINS A AND B : NEGATIVE   INTERPRETATION            Negative for C. difficile.    ANTIBIOTIC                                                        Coagulation Studies: No results for input(s): LABPROT, INR in the last 72 hours.  Urinalysis: No results for input(s): COLORURINE, LABSPEC, PHURINE, GLUCOSEU, HGBUR, BILIRUBINUR, KETONESUR, PROTEINUR, UROBILINOGEN, NITRITE, LEUKOCYTESUR in the last 72 hours.  Invalid input(s): APPERANCEUR    Imaging: Dg Chest 2 View  11/02/2015  CLINICAL DATA:  Chest pain for 4 days. EXAM: CHEST  2 VIEW COMPARISON:  CT 02/03/2015. Radiograph 08/17/2014. Radiographs from 06/07/2015 images not available. FINDINGS: Right-sided pacemaker in place. Renal cardiomegaly is unchanged. There is atherosclerosis of the thoracic aorta. No pulmonary edema. No pleural effusion. Minimal pleural thickening of the left interlobar fissure as seen on prior CT. Mild atelectasis in the lower lobes, right greater than left. Left lung base nodular opacity on prior CT not well seen radiographically. Intact osseous structures. IMPRESSION: Mild bibasilar atelectasis.  Stable mild cardiomegaly. Electronically Signed   By: Rubye OaksMelanie  Ehinger M.D.   On: 11/02/2015 03:22     Medications:     . allopurinol  100 mg Oral Daily  . aspirin EC  81 mg Oral Daily  . calcium acetate  1,334 mg Oral TID WC  . clopidogrel  300 mg Oral Once  . [START ON 11/03/2015] clopidogrel  75 mg Oral Daily  . docusate sodium  100 mg Oral QODAY  . finasteride  5 mg Oral Daily  .  fluticasone  2 spray Each Nare Daily  . furosemide  40 mg Oral Daily  . gabapentin  100 mg Oral QHS  . heparin  5,000 Units Subcutaneous Q8H  . insulin aspart  0-9 Units Subcutaneous TID WC  . insulin glargine  6 Units Subcutaneous QHS  . isosorbide mononitrate  30 mg Oral Daily  . levothyroxine  100 mcg Oral QAC breakfast  . loratadine  10 mg Oral Daily  . omega-3 acid ethyl esters  1 g Oral BID  . pantoprazole  40 mg Oral BID  . simvastatin  20 mg Oral q1800  . sodium chloride flush  3 mL Intravenous Q12H   sodium chloride, sodium chloride, acetaminophen **OR** acetaminophen, alteplase, calcium acetate, heparin, HYDROcodone-acetaminophen, hydroxypropyl methylcellulose / hypromellose, ipratropium-albuterol, lidocaine (PF), lidocaine-prilocaine, morphine injection, ondansetron **OR** ondansetron (ZOFRAN) IV, pentafluoroprop-tetrafluoroeth  Assessment/ Plan:  82 y.o. male on hemodialysis Monday, Wednesday, Friday [facility], anemia of chronic kidney disease, secondary hyperparathyroid is him, hypertension, hyperlipidemia, diabetes mellitus type 2, hypothyroidism, coronary artery disease, sleep apnea, GERD admitted for chest pain.  [facility] nephrology/MWF/[address]   1. End-stage renal disease on dialysis MWF. The patient is in need of dialysis today. We have prepared orders for this. Continue dialysis on MWF schedule.  2. Anemia of chronic kidney disease. Hemoglobin currently 13.5. Hold off on Aranesp.  3. Secondary hyperparathyroidism. Phosphorus currently 4.6 and acceptable.  Continue calcium acetate.  4. Chest pain: As per hospitalist and cardiology consultation.   LOS:  Novella Abraha 7/5/20174:10 PM

## 2015-11-02 NOTE — H&P (Signed)
Johnathan PISCOPO Sr. is an 82 y.o. male.   Chief Complaint: Chest pain HPI: The patient with past medical history of coronary artery disease and end-stage renal disease on dialysis presents emergency department complaining of chest pain. He states that he's had multiple episodes of chest pain over the last 4 days but usually resolved with Tums. However, tonight he took Tums in the evening and went to bed but was awakened by chest pain that radiated laterally across his chest. He denies shortness of breath, nausea, vomiting or diaphoresis. After transport to the emergency department he was found to have elevated troponin. EKG and telemetry were unchanged but the patient continued to have nagging chest pressure 2 out of 10 in severity after aspiring 324 mg and nitroglycerin spray which prompted the emergency department staff to call for admission.  Past Medical History  Diagnosis Date  . Chronic kidney disease   . Obesity   . Hyperlipidemia   . Hypertension   . Diabetes mellitus without complication ([place])   . Hypothyroidism   . Coronary artery disease   . Arthritis   . Gout   . Hypercholesterolemia   . Sleep apnea   . Bronchitis   . GERD (gastroesophageal reflux disease)   . Shingles     Past Surgical History  Procedure Laterality Date  . Cholecystectomy    . Hernia repair    . Eye surgery    . Peripheral vascular catheterization Left 01/25/2015    Procedure: A/V Shuntogram/Fistulagram;  Surgeon: Katha Cabal, MD;  Location: [facility];  Service: Cardiovascular;  Laterality: Left;  . Peripheral vascular catheterization N/A 01/25/2015    Procedure: A/V Shunt Intervention;  Surgeon: Katha Cabal, MD;  Location: [facility];  Service: Cardiovascular;  Laterality: N/A;    Family History  Problem Relation Age of Onset  . Diabetes Mellitus II Father    Social History:  reports that he has quit smoking. He does not have any smokeless tobacco history on file. He  reports that he does not drink alcohol or use illicit drugs.  Allergies:  Allergies  Allergen Reactions  . Cinacalcet Other (See Comments)    Reaction: unknown  . Lisinopril Cough    Prior to Admission medications   Medication Sig Start Date End Date Taking? Authorizing Provider  acetaminophen (TYLENOL) 325 MG tablet Take 650 mg by mouth every 6 (six) hours as needed for mild pain or fever.   Yes Historical Provider, MD  albuterol-ipratropium (COMBIVENT) 18-103 MCG/ACT inhaler Inhale 2 puffs into the lungs every 4 (four) hours as needed for wheezing or shortness of breath.   Yes Historical Provider, MD  allopurinol (ZYLOPRIM) 100 MG tablet Take 100 mg by mouth daily.   Yes Historical Provider, MD  aspirin EC 81 MG tablet Take 81 mg by mouth daily.   Yes Historical Provider, MD  calcium acetate (PHOSLO) 667 MG capsule Take 667 mg by mouth 2 (two) times daily between meals as needed (with snacks).   Yes Historical Provider, MD  calcium acetate, Phos Binder, (PHOSLYRA) 667 MG/5ML SOLN Take 1,334 mg by mouth 3 (three) times daily with meals.    Yes Historical Provider, MD  docusate sodium (COLACE) 100 MG capsule Take 100 mg by mouth every other day.   Yes Historical Provider, MD  finasteride (PROSCAR) 5 MG tablet Take 5 mg by mouth daily.   Yes Historical Provider, MD  flunisolide (NASAREL) 29 MCG/ACT (0.025%) nasal spray Place 2 sprays into the  nose 2 (two) times daily. Dose is for each nostril.   Yes Historical Provider, MD  furosemide (LASIX) 40 MG tablet Take 40 mg by mouth daily.   Yes Historical Provider, MD  gabapentin (NEURONTIN) 100 MG capsule Take 100 mg by mouth at bedtime.    Yes Historical Provider, MD  glipiZIDE (GLUCOTROL) 5 MG tablet Take by mouth daily before breakfast.   Yes Historical Provider, MD  hydroxypropyl methylcellulose / hypromellose (ISOPTO TEARS / GONIOVISC) 2.5 % ophthalmic solution Place 1 drop into both eyes as needed for dry eyes.   Yes Historical Provider, MD   insulin glargine (LANTUS) 100 UNIT/ML injection Inject 10 Units into the skin at bedtime.    Yes Historical Provider, MD  LACTOBACILLUS PO Take 2 mg by mouth 2 (two) times daily.    Yes Historical Provider, MD  levothyroxine (SYNTHROID, LEVOTHROID) 100 MCG tablet Take 100 mcg by mouth daily before breakfast.   Yes Historical Provider, MD  loratadine (CLARITIN) 10 MG tablet Take 10 mg by mouth daily.   Yes Historical Provider, MD  omega-3 acid ethyl esters (LOVAZA) 1 g capsule Take 1 g by mouth 2 (two) times daily.   Yes Historical Provider, MD  omeprazole (PRILOSEC) 40 MG capsule Take 40 mg by mouth daily.   Yes Historical Provider, MD  simvastatin (ZOCOR) 20 MG tablet Take 20 mg by mouth daily at 6 PM.   Yes Historical Provider, MD     Results for orders placed or performed during the hospital encounter of 11/02/15 (from the past 48 hour(s))  Basic metabolic panel     Status: Abnormal   Collection Time: 11/02/15  3:00 AM  Result Value Ref Range   Sodium 134 (L) 135 - 145 mmol/L   Potassium 4.2 3.5 - 5.1 mmol/L   Chloride 91 (L) 101 - 111 mmol/L   CO2 30 22 - 32 mmol/L   Glucose, Bld 189 (H) 65 - 99 mg/dL   BUN 36 (H) 6 - 20 mg/dL   Creatinine, Ser 6.96 (H) 0.61 - 1.24 mg/dL   Calcium 10.6 (H) 8.9 - 10.3 mg/dL   GFR calc non Af Amer 6 (L) >60 mL/min   GFR calc Af Amer 7 (L) >60 mL/min    Comment: (NOTE) The eGFR has been calculated using the CKD EPI equation. This calculation has not been validated in all clinical situations. eGFR's persistently <60 mL/min signify possible Chronic Kidney Disease.    Anion gap 13 5 - 15  CBC     Status: Abnormal   Collection Time: 11/02/15  3:00 AM  Result Value Ref Range   WBC 8.4 3.8 - 10.6 K/uL   RBC 3.73 (L) 4.40 - 5.90 MIL/uL   Hemoglobin 13.5 13.0 - 18.0 g/dL   HCT 38.9 (L) 40.0 - 52.0 %   MCV 104.1 (H) 80.0 - 100.0 fL   MCH 36.0 (H) 26.0 - 34.0 pg   MCHC 34.6 32.0 - 36.0 g/dL   RDW 14.6 (H) 11.5 - 14.5 %   Platelets 157 150 - 440  K/uL  Troponin I     Status: Abnormal   Collection Time: 11/02/15  3:00 AM  Result Value Ref Range   Troponin I 0.06 (HH) <0.03 ng/mL    Comment: CRITICAL RESULT CALLED TO, READ BACK BY AND VERIFIED WITH TERESA HUDSON ON 11/02/15 AT 0413 BY TLB    Dg Chest 2 View  11/02/2015  CLINICAL DATA:  Chest pain for 4 days. EXAM: CHEST  2 VIEW  COMPARISON:  CT 02/03/2015. Radiograph 08/17/2014. Radiographs from 06/07/2015 images not available. FINDINGS: Right-sided pacemaker in place. Renal cardiomegaly is unchanged. There is atherosclerosis of the thoracic aorta. No pulmonary edema. No pleural effusion. Minimal pleural thickening of the left interlobar fissure as seen on prior CT. Mild atelectasis in the lower lobes, right greater than left. Left lung base nodular opacity on prior CT not well seen radiographically. Intact osseous structures. IMPRESSION: Mild bibasilar atelectasis.  Stable mild cardiomegaly. Electronically Signed   By: Jeb Levering M.D.   On: 11/02/2015 03:22    Review of Systems  Constitutional: Negative for fever and chills.  HENT: Negative for sore throat and tinnitus.   Eyes: Negative for blurred vision and redness.  Respiratory: Negative for cough and shortness of breath.   Cardiovascular: Positive for chest pain. Negative for palpitations, orthopnea and PND.  Gastrointestinal: Negative for nausea, vomiting, abdominal pain and diarrhea.  Genitourinary: Negative for dysuria, urgency and frequency.  Musculoskeletal: Negative for myalgias and joint pain.  Skin: Negative for rash.       No lesions  Neurological: Negative for speech change, focal weakness and weakness.  Endo/Heme/Allergies: Does not bruise/bleed easily.       No temperature intolerance  Psychiatric/Behavioral: Negative for depression and suicidal ideas.    Blood pressure 116/73, pulse 94, temperature 97.6 F (36.4 C), temperature source Oral, resp. rate 10, height _0  (1.676 m), weight 91.3 kg (201 lb 4.5 oz),  SpO2 92 %. Physical Exam  Nursing note and vitals reviewed. Constitutional: He is oriented to person, place, and time. He appears well-developed and well-nourished. No distress.  HENT:  Head: Normocephalic and atraumatic.  Mouth/Throat: Oropharynx is clear and moist.  Eyes: Conjunctivae and EOM are normal. Pupils are equal, round, and reactive to light. No scleral icterus.  Neck: Normal range of motion. Neck supple. No JVD present. No tracheal deviation present. No thyromegaly present.  Cardiovascular: Normal rate.  Exam reveals no gallop and no friction rub.   Murmur heard. GI: Soft. Bowel sounds are normal. He exhibits no distension. There is no tenderness.  Genitourinary:  Deferred  Musculoskeletal: Normal range of motion. He exhibits no edema.  Lymphadenopathy:    He has no cervical adenopathy.  Neurological: He is alert and oriented to person, place, and time. No cranial nerve deficit.  Skin: Skin is warm and dry. No rash noted. No erythema.  Psychiatric: He has a normal mood and affect. His behavior is normal. Judgment and thought content normal.     Assessment/Plan This is a 82 year old male admitted for atypical chest pain. 1. Chest pain: Atypical; duration and associated symptoms not typical for ACS. Likely heartburn or muscle skeletal. Nonetheless, cycle cardiac enzymes and monitor telemetry. Continue daily dose of aspirin per home regimen. Troponin elevated likely due to poor clearance secondary to end-stage renal disease. Consult cardiology. 2. Diabetes mellitus type 2: Hold oral hypoglycemic agents. Continue basal insulin adjusted for hospital diet. Sliding scale insulin as well next 3. Essential hypertension: Controlled 4. End-stage renal disease: On dialysis. Scheduled Monday, Wednesday,Friday. Nephrology consulted to continue treatment. Continue calcium acetate 5. Hypothyroidism: Continue Synthroid 6. Gout: Continue allopurinol 7. Hyperlipidemia: Continue statin  therapy 8. BPH: Continue finasteride 9. DVT prophylaxis: Heparin 10. GI prophylaxis: PPI per home regimen The patient is a full code. Time spent on admission orders and patient care approximately 45 minutes  Harrie Foreman, MD 11/02/2015, 6:17 AM

## 2015-11-02 NOTE — Care Management Obs Status (Signed)
MEDICARE OBSERVATION STATUS NOTIFICATION   Patient Details  Name: Johnathan LabellaLevi D Mikes Sr. MRN: [id number] Date of Birth: 28-May-1922   Medicare Observation Status Notification Given:  Yes    [name], Johnathan Hakimian R, RN 11/02/2015, 10:58 AM

## 2015-11-02 NOTE — ED Notes (Signed)
Lab called troponin level of 0.06 - reported to Dr Manson PasseyBrown

## 2015-11-02 NOTE — Progress Notes (Signed)
Johnathan LabellaLevi D Balding Sr. is a 82 y.o. male  [id number]  Primary Cardiologist:  [name] Reason for Consultation: Chest pain  HPI: 82 year old white male with a past medical history of end-stage renal disease and multiple other medical problems presented with chest pain which has resolved now and is no longer having chest pain. He has mildly elevated troponin.   Review of Systems: No orthopnea PND or leg swelling.   Past Medical History  Diagnosis Date  . Chronic kidney disease   . Obesity   . Hyperlipidemia   . Hypertension   . Diabetes mellitus without complication ([facility])   . Hypothyroidism   . Coronary artery disease   . Arthritis   . Gout   . Hypercholesterolemia   . Sleep apnea   . Bronchitis   . GERD (gastroesophageal reflux disease)   . Shingles     Medications Prior to Admission  Medication Sig Dispense Refill  . acetaminophen (TYLENOL) 325 MG tablet Take 650 mg by mouth every 6 (six) hours as needed for mild pain or fever.    Marland Kitchen. albuterol-ipratropium (COMBIVENT) 18-103 MCG/ACT inhaler Inhale 2 puffs into the lungs every 4 (four) hours as needed for wheezing or shortness of breath.    . allopurinol (ZYLOPRIM) 100 MG tablet Take 100 mg by mouth daily.    Marland Kitchen. aspirin EC 81 MG tablet Take 81 mg by mouth daily.    . calcium acetate (PHOSLO) 667 MG capsule Take 667 mg by mouth 2 (two) times daily between meals as needed (with snacks).    . calcium acetate, Phos Binder, (PHOSLYRA) 667 MG/5ML SOLN Take 1,334 mg by mouth 3 (three) times daily with meals.     . docusate sodium (COLACE) 100 MG capsule Take 100 mg by mouth every other day.    . finasteride (PROSCAR) 5 MG tablet Take 5 mg by mouth daily.    . flunisolide (NASAREL) 29 MCG/ACT (0.025%) nasal spray Place 2 sprays into the nose 2 (two) times daily. Dose is for each nostril.    . furosemide (LASIX) 40 MG tablet Take 40 mg by mouth daily.    Marland Kitchen. gabapentin (NEURONTIN) 100 MG capsule Take 100 mg by mouth at bedtime.     Marland Kitchen.  glipiZIDE (GLUCOTROL) 5 MG tablet Take by mouth daily before breakfast.    . hydroxypropyl methylcellulose / hypromellose (ISOPTO TEARS / GONIOVISC) 2.5 % ophthalmic solution Place 1 drop into both eyes as needed for dry eyes.    . insulin glargine (LANTUS) 100 UNIT/ML injection Inject 10 Units into the skin at bedtime.     Marland Kitchen. LACTOBACILLUS PO Take 2 mg by mouth 2 (two) times daily.     Marland Kitchen. levothyroxine (SYNTHROID, LEVOTHROID) 100 MCG tablet Take 100 mcg by mouth daily before breakfast.    . loratadine (CLARITIN) 10 MG tablet Take 10 mg by mouth daily.    Marland Kitchen. omega-3 acid ethyl esters (LOVAZA) 1 g capsule Take 1 g by mouth 2 (two) times daily.    Marland Kitchen. omeprazole (PRILOSEC) 40 MG capsule Take 40 mg by mouth daily.    . simvastatin (ZOCOR) 20 MG tablet Take 20 mg by mouth daily at 6 PM.       . allopurinol  100 mg Oral Daily  . aspirin EC  81 mg Oral Daily  . calcium acetate  1,334 mg Oral TID WC  . docusate sodium  100 mg Oral QODAY  . finasteride  5 mg Oral Daily  . fluticasone  2 spray Each Nare Daily  . furosemide  40 mg Oral Daily  . gabapentin  100 mg Oral QHS  . heparin  5,000 Units Subcutaneous Q8H  . insulin aspart  0-9 Units Subcutaneous TID WC  . insulin glargine  6 Units Subcutaneous QHS  . levothyroxine  100 mcg Oral QAC breakfast  . loratadine  10 mg Oral Daily  . omega-3 acid ethyl esters  1 g Oral BID  . pantoprazole  40 mg Oral Daily  . simvastatin  20 mg Oral q1800  . sodium chloride flush  3 mL Intravenous Q12H    Infusions:    Allergies  Allergen Reactions  . Cinacalcet Other (See Comments)    Reaction: unknown  . Lisinopril Cough    Social History   Social History  . Marital Status: Married    Spouse Name: N/A  . Number of Children: N/A  . Years of Education: N/A   Occupational History  . Not on file.   Social History Main Topics  . Smoking status: Former Smoker -- 2.00 packs/day for 15 years  . Smokeless tobacco: Not on file  . Alcohol Use: No  .  Drug Use: No  . Sexual Activity: Not on file   Other Topics Concern  . Not on file   Social History Narrative    Family History  Problem Relation Age of Onset  . Diabetes Mellitus II Father     PHYSICAL EXAM: Filed Vitals:   11/02/15 0730 11/02/15 0758  BP: 109/63 118/62  Pulse: 86 87  Temp:  98.3 F (36.8 C)  Resp: 14 19    No intake or output data in the 24 hours ending 11/02/15 0850  General:  Well appearing. No respiratory difficulty HEENT: normal Neck: supple. no JVD. Carotids 2+ bilat; no bruits. No lymphadenopathy or thryomegaly appreciated. Cor: PMI nondisplaced. Regular rate & rhythm. No rubs, gallops or murmurs. Lungs: clear Abdomen: soft, nontender, nondistended. No hepatosplenomegaly. No bruits or masses. Good bowel sounds. Extremities: no cyanosis, clubbing, rash, edema Neuro: alert & oriented x 3, cranial nerves grossly intact. moves all 4 extremities w/o difficulty. Affect pleasant.  ECG: Sinus rhythm with right bundle branch block and nonspecific ST-T changes.  Results for orders placed or performed during the hospital encounter of 11/02/15 (from the past 24 hour(s))  Basic metabolic panel     Status: Abnormal   Collection Time: 11/02/15  3:00 AM  Result Value Ref Range   Sodium 134 (L) 135 - 145 mmol/L   Potassium 4.2 3.5 - 5.1 mmol/L   Chloride 91 (L) 101 - 111 mmol/L   CO2 30 22 - 32 mmol/L   Glucose, Bld 189 (H) 65 - 99 mg/dL   BUN 36 (H) 6 - 20 mg/dL   Creatinine, Ser 1.616.96 (H) 0.61 - 1.24 mg/dL   Calcium 09.610.6 (H) 8.9 - 10.3 mg/dL   GFR calc non Af Amer 6 (L) >60 mL/min   GFR calc Af Amer 7 (L) >60 mL/min   Anion gap 13 5 - 15  CBC     Status: Abnormal   Collection Time: 11/02/15  3:00 AM  Result Value Ref Range   WBC 8.4 3.8 - 10.6 K/uL   RBC 3.73 (L) 4.40 - 5.90 MIL/uL   Hemoglobin 13.5 13.0 - 18.0 g/dL   HCT 04.538.9 (L) 40.940.0 - 81.152.0 %   MCV 104.1 (H) 80.0 - 100.0 fL   MCH 36.0 (H) 26.0 - 34.0 pg   MCHC 34.6 32.0 -  36.0 g/dL   RDW 45.4  (H) 09.8 - 14.5 %   Platelets 157 150 - 440 K/uL  Troponin I     Status: Abnormal   Collection Time: 11/02/15  3:00 AM  Result Value Ref Range   Troponin I 0.06 (HH) <0.03 ng/mL  Glucose, capillary     Status: Abnormal   Collection Time: 11/02/15  7:56 AM  Result Value Ref Range   Glucose-Capillary 107 (H) 65 - 99 mg/dL   Dg Chest 2 View  04/30/9145  CLINICAL DATA:  Chest pain for 4 days. EXAM: CHEST  2 VIEW COMPARISON:  CT 02/03/2015. Radiograph 08/17/2014. Radiographs from 06/07/2015 images not available. FINDINGS: Right-sided pacemaker in place. Renal cardiomegaly is unchanged. There is atherosclerosis of the thoracic aorta. No pulmonary edema. No pleural effusion. Minimal pleural thickening of the left interlobar fissure as seen on prior CT. Mild atelectasis in the lower lobes, right greater than left. Left lung base nodular opacity on prior CT not well seen radiographically. Intact osseous structures. IMPRESSION: Mild bibasilar atelectasis.  Stable mild cardiomegaly. Electronically Signed   By: Rubye Oaks M.D.   On: 11/02/2015 03:22    ASSESSMENT AND PLAN: Atypical chest pain with history of end-stage renal disease and multiple other medical problems. Patient can go home after dialysis with follow-up stress test tomorrow at 8:30 in my office we'll do Lexiscan Myoview in my office 8:30 tomorrow morning. Gave patient the appointment but should have dialysis before he gets discharged. Admitted isosorbide 30 mg by mouth by mouth once a day and changed omeprazole to Protonix 40 by mouth twice a day. Also added Plavix Gratia Disla A

## 2015-11-02 NOTE — Progress Notes (Signed)
Pre HD assessment  

## 2015-11-02 NOTE — Progress Notes (Signed)
MD Sudini was notified of trop 0.08

## 2015-11-02 NOTE — Progress Notes (Signed)
Patient can go home after dialysis as is schedualed  8:30 for stress test in office.

## 2015-11-02 NOTE — Progress Notes (Signed)
PRE HD   

## 2015-11-02 NOTE — ED Notes (Signed)
Pt arrived to ED from home reporting onset of chest pain Sunday (4 days ago) that was relieved with TUMS on Sunday but returned yesterday and worsened today. Pt was given 324 ASA and 1 Nitro Spray that decreased pts pain from a 8/10 to a 1/10. Pt is alert and oriented x 4 upon arrival. Pt has a hx of chest pain and two MIs, one of which pt was coded. Pt is also a dialysis pt that is due for dialysis today.

## 2015-11-02 NOTE — ED Provider Notes (Signed)
[facility] Emergency Department Provider Note  ____________________________________________  Time seen: 3:00 AM  I have reviewed the triage vital signs and the nursing notes.   HISTORY  Chief Complaint Chest Pain      HPI Johnathan FailLevi D Sammon Sr. is a 82 y.o. male history coronary artery disease, myocardial infarction 2 chronic renal disease hypertension hyperlipidemia and diabetes presents with intermittent chest pain 3 days. Patient states starting on Sunday he had central chest pressure which was relieved with Tums reoccurred yesterday and then again tonight at 1 AM that was unrelieved with Tums. Patient denies any dyspnea no diaphoresis no dizziness no palpitations. Patient was given 324 milligrams of aspirin and one is nitroglycerin spray with pain improvement from 8 out of 10-1 out of 10.     Past Medical History  Diagnosis Date  . Chronic kidney disease   . Obesity   . Hyperlipidemia   . Hypertension   . Diabetes mellitus without complication (HCC)   . Hypothyroidism   . Coronary artery disease   . Arthritis   . Gout   . Hypercholesterolemia   . Sleep apnea   . Bronchitis   . GERD (gastroesophageal reflux disease)   . Shingles     There are no active problems to display for this patient.   Past Surgical History  Procedure Laterality Date  . Cholecystectomy    . Hernia repair    . Eye surgery    . Peripheral vascular catheterization Left 01/25/2015    Procedure: A/V Shuntogram/Fistulagram;  Surgeon: Renford DillsGregory G Schnier, MD;  Location: [facility] INVASIVE CV LAB;  Service: Cardiovascular;  Laterality: Left;  . Peripheral vascular catheterization N/A 01/25/2015    Procedure: A/V Shunt Intervention;  Surgeon: Renford DillsGregory G Schnier, MD;  Location: [facility] INVASIVE CV LAB;  Service: Cardiovascular;  Laterality: N/A;    Current Outpatient Rx  Name  Route  Sig  Dispense  Refill  . acetaminophen (TYLENOL) 325 MG tablet   Oral   Take 650 mg by mouth every 6  (six) hours as needed for mild pain or fever.         Marland Kitchen. albuterol-ipratropium (COMBIVENT) 18-103 MCG/ACT inhaler   Inhalation   Inhale 2 puffs into the lungs every 4 (four) hours as needed for wheezing or shortness of breath.         . allopurinol (ZYLOPRIM) 100 MG tablet   Oral   Take 100 mg by mouth daily.         Marland Kitchen. aspirin EC 81 MG tablet   Oral   Take 81 mg by mouth daily.         . calcium acetate (PHOSLO) 667 MG capsule   Oral   Take 667 mg by mouth 2 (two) times daily between meals as needed (with snacks).         . calcium acetate, Phos Binder, (PHOSLYRA) 667 MG/5ML SOLN   Oral   Take 1,334 mg by mouth 3 (three) times daily with meals.          . docusate sodium (COLACE) 100 MG capsule   Oral   Take 100 mg by mouth every other day.         . finasteride (PROSCAR) 5 MG tablet   Oral   Take 5 mg by mouth daily.         . flunisolide (NASAREL) 29 MCG/ACT (0.025%) nasal spray   Nasal   Place 2 sprays into the nose 2 (two) times daily. Dose is  for each nostril.         . furosemide (LASIX) 40 MG tablet   Oral   Take 40 mg by mouth daily.         Marland Kitchen gabapentin (NEURONTIN) 100 MG capsule   Oral   Take 100 mg by mouth at bedtime.          Marland Kitchen glipiZIDE (GLUCOTROL) 5 MG tablet   Oral   Take by mouth daily before breakfast.         . hydroxypropyl methylcellulose / hypromellose (ISOPTO TEARS / GONIOVISC) 2.5 % ophthalmic solution   Both Eyes   Place 1 drop into both eyes as needed for dry eyes.         . insulin glargine (LANTUS) 100 UNIT/ML injection   Subcutaneous   Inject 10 Units into the skin at bedtime.          Marland Kitchen LACTOBACILLUS PO   Oral   Take 2 mg by mouth 2 (two) times daily.          Marland Kitchen levothyroxine (SYNTHROID, LEVOTHROID) 100 MCG tablet   Oral   Take 100 mcg by mouth daily before breakfast.         . loratadine (CLARITIN) 10 MG tablet   Oral   Take 10 mg by mouth daily.         Marland Kitchen omega-3 acid ethyl esters  (LOVAZA) 1 g capsule   Oral   Take 1 g by mouth 2 (two) times daily.         Marland Kitchen omeprazole (PRILOSEC) 40 MG capsule   Oral   Take 40 mg by mouth daily.         . simvastatin (ZOCOR) 20 MG tablet   Oral   Take 20 mg by mouth daily at 6 PM.           Allergies Cinacalcet and Lisinopril  History reviewed. No pertinent family history.  Social History Social History  Substance Use Topics  . Smoking status: Former Smoker -- 2.00 packs/day for 15 years  . Smokeless tobacco: None  . Alcohol Use: No    Review of Systems  Constitutional: Negative for fever. Eyes: Negative for visual changes. ENT: Negative for sore throat. Cardiovascular: Positive for chest pain. Respiratory: Negative for shortness of breath. Gastrointestinal: Negative for abdominal pain, vomiting and diarrhea. Genitourinary: Negative for dysuria. Musculoskeletal: Negative for back pain. Skin: Negative for rash. Neurological: Negative for headaches, focal weakness or numbness.   10-point ROS otherwise negative.  ____________________________________________   PHYSICAL EXAM:  VITAL SIGNS: ED Triage Vitals  Enc Vitals Group     BP 11/02/15 0300 134/78 mmHg     Pulse Rate 11/02/15 0300 97     Resp 11/02/15 0300 17     Temp 11/02/15 0300 97.7 F (36.5 C)     Temp Source 11/02/15 0300 Oral     SpO2 11/02/15 0300 99 %     Weight 11/02/15 0300 201 lb 4.5 oz (91.3 kg)     Height 11/02/15 0300  (1.676 m)     Head Cir --      Peak Flow --      Pain Score 11/02/15 0255 1     Pain Loc --      Pain Edu? --      Excl. in GC? --     Constitutional: Alert and oriented. Well appearing and in no distress. Eyes: Conjunctivae are normal. PERRL. Normal extraocular movements. ENT   Head:  Normocephalic and atraumatic.   Nose: No congestion/rhinnorhea.   Mouth/Throat: Mucous membranes are moist.   Neck: No stridor. Hematological/Lymphatic/Immunilogical: No cervical  lymphadenopathy. Cardiovascular: Normal rate, regular rhythm. Normal and symmetric distal pulses are present in all extremities. No murmurs, rubs, or gallops. Respiratory: Normal respiratory effort without tachypnea nor retractions. Breath sounds are clear and equal bilaterally. No wheezes/rales/rhonchi. Gastrointestinal: Soft and nontender. No distention. There is no CVA tenderness. Genitourinary: deferred Musculoskeletal: Nontender with normal range of motion in all extremities. No joint effusions.  No lower extremity tenderness nor edema. Neurologic:  Normal speech and language. No gross focal neurologic deficits are appreciated. Speech is normal.  Skin:  Skin is warm, dry and intact. No rash noted. Psychiatric: Mood and affect are normal. Speech and behavior are normal. Patient exhibits appropriate insight and judgment.  ____________________________________________    LABS (pertinent positives/negatives)  Labs Reviewed  BASIC METABOLIC PANEL - Abnormal; Notable for the following:    Sodium 134 (*)    Chloride 91 (*)    Glucose, Bld 189 (*)    BUN 36 (*)    Creatinine, Ser 6.96 (*)    Calcium 10.6 (*)    GFR calc non Af Amer 6 (*)    GFR calc Af Amer 7 (*)    All other components within normal limits  CBC - Abnormal; Notable for the following:    RBC 3.73 (*)    HCT 38.9 (*)    MCV 104.1 (*)    MCH 36.0 (*)    RDW 14.6 (*)    All other components within normal limits  TROPONIN I - Abnormal; Notable for the following:    Troponin I 0.06 (*)    All other components within normal limits     ____________________________________________   EKG  ED ECG REPORT I, [place] N Lenay Lovejoy, the attending physician, personally viewed and interpreted this ECG.   Date: 11/02/2015  EKG Time: 2:52 AM  Rate: 97  Rhythm: Normal sinus rhythm with right bundle-branch block  Axis: Normal  Intervals: Normal  ST&T Change: None   ____________________________________________     RADIOLOGY  DG Chest 2 View (Final result) Result time: 11/02/15 03:22:07   Final result by Rad Results In Interface (11/02/15 03:22:07)   Narrative:   CLINICAL DATA: Chest pain for 4 days.  EXAM: CHEST 2 VIEW  COMPARISON: CT 02/03/2015. Radiograph 08/17/2014. Radiographs from 06/07/2015 images not available.  FINDINGS: Right-sided pacemaker in place. Renal cardiomegaly is unchanged. There is atherosclerosis of the thoracic aorta. No pulmonary edema. No pleural effusion. Minimal pleural thickening of the left interlobar fissure as seen on prior CT. Mild atelectasis in the lower lobes, right greater than left. Left lung base nodular opacity on prior CT not well seen radiographically. Intact osseous structures.  IMPRESSION: Mild bibasilar atelectasis. Stable mild cardiomegaly.   Electronically Signed By: Rubye OaksMelanie Ehinger M.D. On: 11/02/2015 03:22            Procedures    INITIAL IMPRESSION / ASSESSMENT AND PLAN / ED COURSE  Pertinent labs & imaging results that were available during my care of the patient were reviewed by me and considered in my medical decision making (see chart for details).  Given history physical exam and positive troponin 0.06 patient discussed with Dr. Sheryle Haildiamond hospitalist for hospital admission for further evaluation and management ____________________________________________   FINAL CLINICAL IMPRESSION(S) / ED DIAGNOSES  Final diagnoses:  Chest pain, unspecified chest pain type      Darci Currentandolph N Chizara Mena, MD 11/02/15  0514 

## 2015-11-02 NOTE — Discharge Instructions (Signed)
°  DIET:  Renal diet  DISCHARGE CONDITION:  Stable  ACTIVITY:  Activity as tolerated. Do not lift weight >15 lbs and No exertion till you see Dr. Welton FlakesKhan.  OXYGEN:  Home Oxygen: No.   Oxygen Delivery: room air  DISCHARGE LOCATION:  home   If you experience worsening of your admission symptoms, develop shortness of breath, life threatening emergency, suicidal or homicidal thoughts you must seek medical attention immediately by calling 911 or calling your MD immediately  if symptoms less severe.  You Must read complete instructions/literature along with all the possible adverse reactions/side effects for all the Medicines you take and that have been prescribed to you. Take any new Medicines after you have completely understood and accpet all the possible adverse reactions/side effects.   Please note  You were cared for by a hospitalist during your hospital stay. If you have any questions about your discharge medications or the care you received while you were in the hospital after you are discharged, you can call the unit and asked to speak with the hospitalist on call if the hospitalist that took care of you is not available. Once you are discharged, your primary care physician will handle any further medical issues. Please note that NO REFILLS for any discharge medications will be authorized once you are discharged, as it is imperative that you return to your primary care physician (or establish a relationship with a primary care physician if you do not have one) for your aftercare needs so that they can reassess your need for medications and monitor your lab values.

## 2015-11-02 NOTE — Care Management (Signed)
Patient placed in observation for chest pain.  After obs notice was signed found that patient was sent for dialysis treatment.  He is chronic dialysis  M W F.  Dialysis coordinator informed.

## 2015-11-02 NOTE — Progress Notes (Signed)
HD TX started  

## 2015-11-02 NOTE — Progress Notes (Signed)
Patient of Dr. Park BreedKahn, MD. Consult should go to him.

## 2015-11-03 LAB — PARATHYROID HORMONE, INTACT (NO CA): PTH: 101 pg/mL — AB (ref 15–65)

## 2015-11-03 NOTE — Discharge Summary (Signed)
[facility] [place] [facility]   PATIENT NAME: [name]    MR#:  [id number]  DATE OF BIRTH:  03/11/1923  DATE OF ADMISSION:  11/02/2015 ADMITTING PHYSICIAN: Arnaldo Natal, MD  DATE OF DISCHARGE: 11/02/2015  6:17 PM  PRIMARY CARE PHYSICIAN: Marisue Ivan, MD   ADMISSION DIAGNOSIS:  Chest pain, unspecified chest pain type [R07.9]  DISCHARGE DIAGNOSIS:  Active Problems:   Chest pain   SECONDARY DIAGNOSIS:   Past Medical History  Diagnosis Date  . Chronic kidney disease   . Obesity   . Hyperlipidemia   . Hypertension   . Diabetes mellitus without complication (HCC)   . Hypothyroidism   . Coronary artery disease   . Arthritis   . Gout   . Hypercholesterolemia   . Sleep apnea   . Bronchitis   . GERD (gastroesophageal reflux disease)   . Shingles      ADMITTING HISTORY  Chief Complaint: Chest pain HPI: The patient with past medical history of coronary artery disease and end-stage renal disease on dialysis presents emergency department complaining of chest pain. He states that he's had multiple episodes of chest pain over the last 4 days but usually resolved with Tums. However, tonight he took Tums in the evening and went to bed but was awakened by chest pain that radiated laterally across his chest. He denies shortness of breath, nausea, vomiting or diaphoresis. After transport to the emergency department he was found to have elevated troponin. EKG and telemetry were unchanged but the patient continued to have nagging chest pressure 2 out of 10 in severity after aspiring 324 mg and nitroglycerin spray which prompted the emergency department staff to call for admission.  HOSPITAL COURSE:   * Chest pain Patient was admitted to telemetry floor. Troponin was repeated and showed no significant increase. There was mild elevation likely due to incisional disease setting. Seen by cardiology Dr. Welton Flakes was scheduled patient for an outpatient stress  test tomorrow.  Patient at time of discharge was chest pain-free. He had dialysis prior to discharge.  Stable for discharge home.  CONSULTS OBTAINED:  Treatment Team:  Laurier Nancy, MD Mady Haagensen, MD  DRUG ALLERGIES:   Allergies  Allergen Reactions  . Cinacalcet Other (See Comments)    Reaction: unknown  . Lisinopril Cough    DISCHARGE MEDICATIONS:   Discharge Medication List as of 11/02/2015 12:18 PM    START taking these medications   Details  clopidogrel (PLAVIX) 75 MG tablet Take 1 tablet (75 mg total) by mouth daily., Starting 11/03/2015, Until Discontinued, Print    isosorbide mononitrate (IMDUR) 30 MG 24 hr tablet Take 1 tablet (30 mg total) by mouth daily., Starting 11/02/2015, Until Discontinued, Print      CONTINUE these medications which have CHANGED   Details  omeprazole (PRILOSEC) 40 MG capsule Take 1 capsule (40 mg total) by mouth 2 (two) times daily., Starting 11/02/2015, Until Discontinued, Print      CONTINUE these medications which have NOT CHANGED   Details  acetaminophen (TYLENOL) 325 MG tablet Take 650 mg by mouth every 6 (six) hours as needed for mild pain or fever., Until Discontinued, Historical Med    albuterol-ipratropium (COMBIVENT) 18-103 MCG/ACT inhaler Inhale 2 puffs into the lungs every 4 (four) hours as needed for wheezing or shortness of breath., Until Discontinued, Historical Med    allopurinol (ZYLOPRIM) 100 MG tablet Take 100 mg by mouth daily., Until Discontinued, Historical Med    aspirin EC  81 MG tablet Take 81 mg by mouth daily., Until Discontinued, Historical Med    calcium acetate (PHOSLO) 667 MG capsule Take 667 mg by mouth 2 (two) times daily between meals as needed (with snacks)., Until Discontinued, Historical Med    calcium acetate, Phos Binder, (PHOSLYRA) 667 MG/5ML SOLN Take 1,334 mg by mouth 3 (three) times daily with meals. , Until Discontinued, Historical Med    docusate sodium (COLACE) 100 MG capsule Take 100 mg by  mouth every other day., Until Discontinued, Historical Med    finasteride (PROSCAR) 5 MG tablet Take 5 mg by mouth daily., Until Discontinued, Historical Med    flunisolide (NASAREL) 29 MCG/ACT (0.025%) nasal spray Place 2 sprays into the nose 2 (two) times daily. Dose is for each nostril., Until Discontinued, Historical Med    furosemide (LASIX) 40 MG tablet Take 40 mg by mouth daily., Until Discontinued, Historical Med    gabapentin (NEURONTIN) 100 MG capsule Take 100 mg by mouth at bedtime. , Until Discontinued, Historical Med    glipiZIDE (GLUCOTROL) 5 MG tablet Take by mouth daily before breakfast., Until Discontinued, Historical Med    hydroxypropyl methylcellulose / hypromellose (ISOPTO TEARS / GONIOVISC) 2.5 % ophthalmic solution Place 1 drop into both eyes as needed for dry eyes., Until Discontinued, Historical Med    insulin glargine (LANTUS) 100 UNIT/ML injection Inject 10 Units into the skin at bedtime. , Until Discontinued, Historical Med    LACTOBACILLUS PO Take 2 mg by mouth 2 (two) times daily. , Until Discontinued, Historical Med    levothyroxine (SYNTHROID, LEVOTHROID) 100 MCG tablet Take 100 mcg by mouth daily before breakfast., Until Discontinued, Historical Med    loratadine (CLARITIN) 10 MG tablet Take 10 mg by mouth daily., Until Discontinued, Historical Med    omega-3 acid ethyl esters (LOVAZA) 1 g capsule Take 1 g by mouth 2 (two) times daily., Until Discontinued, Historical Med    simvastatin (ZOCOR) 20 MG tablet Take 20 mg by mouth daily at 6 PM., Until Discontinued, Historical Med        Today   VITAL SIGNS:  Blood pressure 114/64, pulse 95, temperature 98.1 F (36.7 C), temperature source Oral, resp. rate 13, height 5\' 6"  (1.676 m), weight 88 kg (194 lb 0.1 oz), SpO2 98 %.  I/O:  No intake or output data in the 24 hours ending 11/03/15 1408  PHYSICAL EXAMINATION:  Physical Exam  GENERAL:  82 y.o.-year-old patient lying in the bed with no acute  distress.  LUNGS: Normal breath sounds bilaterally, no wheezing, rales,rhonchi or crepitation. No use of accessory muscles of respiration.  CARDIOVASCULAR: S1, S2 normal. No murmurs, rubs, or gallops.  ABDOMEN: Soft, non-tender, non-distended. Bowel sounds present. No organomegaly or mass.  NEUROLOGIC: Moves all 4 extremities. PSYCHIATRIC: The patient is alert and oriented x 3.  SKIN: No obvious rash, lesion, or ulcer.   DATA REVIEW:   CBC  Recent Labs Lab 11/02/15 0300  WBC 8.4  HGB 13.5  HCT 38.9*  PLT 157    Chemistries   Recent Labs Lab 11/02/15 0300  NA 134*  K 4.2  CL 91*  CO2 30  GLUCOSE 189*  BUN 36*  CREATININE 6.96*  CALCIUM 10.6*    Cardiac Enzymes  Recent Labs Lab 11/02/15 1308  TROPONINI 0.08*    Microbiology Results  Results for orders placed or performed during the hospital encounter of 08/14/14  Clostridium Difficile [facility]     Status: None   Collection Time: 08/15/14 10:00 AM  Result Value Ref Range Status   Micro Text Report   Final       C.DIFFICILE ANTIGEN       C.DIFFICILE GDH ANTIGEN : NEGATIVE   C.DIFFICILE TOXIN A/B     C.DIFFICILE TOXINS A AND B : NEGATIVE   INTERPRETATION            Negative for C. difficile.    ANTIBIOTIC                                                        RADIOLOGY:  Dg Chest 2 View  11/02/2015  CLINICAL DATA:  Chest pain for 4 days. EXAM: CHEST  2 VIEW COMPARISON:  CT 02/03/2015. Radiograph 08/17/2014. Radiographs from 06/07/2015 images not available. FINDINGS: Right-sided pacemaker in place. Renal cardiomegaly is unchanged. There is atherosclerosis of the thoracic aorta. No pulmonary edema. No pleural effusion. Minimal pleural thickening of the left interlobar fissure as seen on prior CT. Mild atelectasis in the lower lobes, right greater than left. Left lung base nodular opacity on prior CT not well seen radiographically. Intact osseous structures. IMPRESSION: Mild bibasilar atelectasis.  Stable mild  cardiomegaly. Electronically Signed   By: Rubye OaksMelanie  Ehinger M.D.   On: 11/02/2015 03:22    Follow up with PCP in 1 week.  Management plans discussed with the patient, family and they are in agreement.  CODE STATUS:  Code Status History    Date Active Date Inactive Code Status Order ID Comments User Context   11/02/2015  7:45 AM 11/02/2015  9:17 PM Full Code [id number]  Arnaldo NatalMichael S Diamond, MD Inpatient      TOTAL TIME TAKING CARE OF THIS PATIENT ON DAY OF DISCHARGE: more than 30 minutes.   Milagros LollSudini, Suraj Ramdass R M.D on 11/03/2015 at 2:08 PM  Between 7am to 6pm - Pager - [phone]  After 6pm go to [email or web address] - password [email or web address] [facility]  [facility] [place] [facility]  Office  [phone]  CC: Primary care physician; Marisue IvanLINTHAVONG, KANHKA, MD  Note: This dictation was prepared with Dragon dictation along with smaller phrase technology. Any transcriptional errors that result from this process are unintentional.

## 2015-11-20 ENCOUNTER — Inpatient Hospital Stay
Admission: EM | Admit: 2015-11-20 | Discharge: 2015-11-29 | DRG: 280 | Disposition: A | Discharge status: Hospice | Payer: Medicare Other | Attending: Specialist | Admitting: Specialist

## 2015-11-20 ENCOUNTER — Encounter: Payer: Self-pay | Admitting: Emergency Medicine

## 2015-11-20 DIAGNOSIS — K219 Gastro-esophageal reflux disease without esophagitis: Secondary | ICD-10-CM | POA: Diagnosis present

## 2015-11-20 DIAGNOSIS — I120 Hypertensive chronic kidney disease with stage 5 chronic kidney disease or end stage renal disease: Secondary | ICD-10-CM | POA: Diagnosis present

## 2015-11-20 DIAGNOSIS — G4733 Obstructive sleep apnea (adult) (pediatric): Secondary | ICD-10-CM | POA: Diagnosis present

## 2015-11-20 DIAGNOSIS — I441 Atrioventricular block, second degree: Secondary | ICD-10-CM | POA: Diagnosis present

## 2015-11-20 DIAGNOSIS — E1122 Type 2 diabetes mellitus with diabetic chronic kidney disease: Secondary | ICD-10-CM | POA: Diagnosis present

## 2015-11-20 DIAGNOSIS — Z992 Dependence on renal dialysis: Secondary | ICD-10-CM

## 2015-11-20 DIAGNOSIS — N2581 Secondary hyperparathyroidism of renal origin: Secondary | ICD-10-CM | POA: Diagnosis present

## 2015-11-20 DIAGNOSIS — Y92239 Unspecified place in hospital as the place of occurrence of the external cause: Secondary | ICD-10-CM | POA: Diagnosis not present

## 2015-11-20 DIAGNOSIS — Z79899 Other long term (current) drug therapy: Secondary | ICD-10-CM

## 2015-11-20 DIAGNOSIS — I447 Left bundle-branch block, unspecified: Secondary | ICD-10-CM | POA: Diagnosis present

## 2015-11-20 DIAGNOSIS — E039 Hypothyroidism, unspecified: Secondary | ICD-10-CM | POA: Diagnosis present

## 2015-11-20 DIAGNOSIS — Z7951 Long term (current) use of inhaled steroids: Secondary | ICD-10-CM

## 2015-11-20 DIAGNOSIS — I4891 Unspecified atrial fibrillation: Secondary | ICD-10-CM | POA: Diagnosis present

## 2015-11-20 DIAGNOSIS — I214 Non-ST elevation (NSTEMI) myocardial infarction: Principal | ICD-10-CM

## 2015-11-20 DIAGNOSIS — R0600 Dyspnea, unspecified: Secondary | ICD-10-CM

## 2015-11-20 DIAGNOSIS — Z7984 Long term (current) use of oral hypoglycemic drugs: Secondary | ICD-10-CM

## 2015-11-20 DIAGNOSIS — I2510 Atherosclerotic heart disease of native coronary artery without angina pectoris: Secondary | ICD-10-CM | POA: Diagnosis present

## 2015-11-20 DIAGNOSIS — D631 Anemia in chronic kidney disease: Secondary | ICD-10-CM | POA: Diagnosis present

## 2015-11-20 DIAGNOSIS — R0789 Other chest pain: Secondary | ICD-10-CM | POA: Diagnosis present

## 2015-11-20 DIAGNOSIS — Z515 Encounter for palliative care: Secondary | ICD-10-CM

## 2015-11-20 DIAGNOSIS — E669 Obesity, unspecified: Secondary | ICD-10-CM | POA: Diagnosis present

## 2015-11-20 DIAGNOSIS — Z7982 Long term (current) use of aspirin: Secondary | ICD-10-CM

## 2015-11-20 DIAGNOSIS — Z794 Long term (current) use of insulin: Secondary | ICD-10-CM

## 2015-11-20 DIAGNOSIS — J690 Pneumonitis due to inhalation of food and vomit: Secondary | ICD-10-CM | POA: Diagnosis present

## 2015-11-20 DIAGNOSIS — I480 Paroxysmal atrial fibrillation: Secondary | ICD-10-CM | POA: Diagnosis present

## 2015-11-20 DIAGNOSIS — N186 End stage renal disease: Secondary | ICD-10-CM

## 2015-11-20 DIAGNOSIS — R410 Disorientation, unspecified: Secondary | ICD-10-CM | POA: Diagnosis present

## 2015-11-20 DIAGNOSIS — A419 Sepsis, unspecified organism: Secondary | ICD-10-CM | POA: Diagnosis present

## 2015-11-20 DIAGNOSIS — R000 Tachycardia, unspecified: Secondary | ICD-10-CM | POA: Diagnosis not present

## 2015-11-20 DIAGNOSIS — Z66 Do not resuscitate: Secondary | ICD-10-CM

## 2015-11-20 DIAGNOSIS — I959 Hypotension, unspecified: Secondary | ICD-10-CM

## 2015-11-20 DIAGNOSIS — G9340 Encephalopathy, unspecified: Secondary | ICD-10-CM | POA: Diagnosis present

## 2015-11-20 DIAGNOSIS — I4510 Unspecified right bundle-branch block: Secondary | ICD-10-CM | POA: Diagnosis present

## 2015-11-20 DIAGNOSIS — I350 Nonrheumatic aortic (valve) stenosis: Secondary | ICD-10-CM | POA: Diagnosis present

## 2015-11-20 DIAGNOSIS — Z6831 Body mass index (BMI) 31.0-31.9, adult: Secondary | ICD-10-CM

## 2015-11-20 DIAGNOSIS — I10 Essential (primary) hypertension: Secondary | ICD-10-CM

## 2015-11-20 DIAGNOSIS — I952 Hypotension due to drugs: Secondary | ICD-10-CM | POA: Diagnosis not present

## 2015-11-20 DIAGNOSIS — Z87891 Personal history of nicotine dependence: Secondary | ICD-10-CM

## 2015-11-20 DIAGNOSIS — N400 Enlarged prostate without lower urinary tract symptoms: Secondary | ICD-10-CM | POA: Diagnosis present

## 2015-11-20 DIAGNOSIS — Z833 Family history of diabetes mellitus: Secondary | ICD-10-CM

## 2015-11-20 DIAGNOSIS — Z888 Allergy status to other drugs, medicaments and biological substances status: Secondary | ICD-10-CM

## 2015-11-20 DIAGNOSIS — Z9981 Dependence on supplemental oxygen: Secondary | ICD-10-CM

## 2015-11-20 DIAGNOSIS — R079 Chest pain, unspecified: Secondary | ICD-10-CM

## 2015-11-20 DIAGNOSIS — T463X5A Adverse effect of coronary vasodilators, initial encounter: Secondary | ICD-10-CM | POA: Diagnosis not present

## 2015-11-20 DIAGNOSIS — J9600 Acute respiratory failure, unspecified whether with hypoxia or hypercapnia: Secondary | ICD-10-CM

## 2015-11-20 DIAGNOSIS — Z7902 Long term (current) use of antithrombotics/antiplatelets: Secondary | ICD-10-CM

## 2015-11-20 DIAGNOSIS — Z452 Encounter for adjustment and management of vascular access device: Secondary | ICD-10-CM

## 2015-11-20 LAB — TROPONIN I
TROPONIN I: 0.3 ng/mL — AB (ref <0.03)
Troponin I: 0.07 ng/mL (ref <0.03)
Troponin I: 3.11 ng/mL (ref <0.03)

## 2015-11-20 LAB — GLUCOSE, CAPILLARY
GLUCOSE-CAPILLARY: 116 mg/dL — AB (ref 65–99)
Glucose-Capillary: 91 mg/dL (ref 65–99)

## 2015-11-20 LAB — BASIC METABOLIC PANEL
ANION GAP: 13 (ref 5–15)
BUN: 30 mg/dL — ABNORMAL HIGH (ref 6–20)
CO2: 28 mmol/L (ref 22–32)
Calcium: 10.1 mg/dL (ref 8.9–10.3)
Chloride: 96 mmol/L — ABNORMAL LOW (ref 101–111)
Creatinine, Ser: 6.73 mg/dL — ABNORMAL HIGH (ref 0.61–1.24)
GFR calc Af Amer: 7 mL/min — ABNORMAL LOW (ref >60)
GFR, EST NON AFRICAN AMERICAN: 6 mL/min — AB (ref >60)
GLUCOSE: 260 mg/dL — AB (ref 65–99)
POTASSIUM: 3.6 mmol/L (ref 3.5–5.1)
Sodium: 137 mmol/L (ref 135–145)

## 2015-11-20 LAB — CBC
HEMATOCRIT: 36.3 % — AB (ref 40.0–52.0)
HEMOGLOBIN: 12.6 g/dL — AB (ref 13.0–18.0)
MCH: 36.2 pg — ABNORMAL HIGH (ref 26.0–34.0)
MCHC: 34.7 g/dL (ref 32.0–36.0)
MCV: 104.4 fL — ABNORMAL HIGH (ref 80.0–100.0)
Platelets: 150 10*3/uL (ref 150–440)
RBC: 3.48 MIL/uL — ABNORMAL LOW (ref 4.40–5.90)
RDW: 14.6 % — ABNORMAL HIGH (ref 11.5–14.5)
WBC: 9.5 10*3/uL (ref 3.8–10.6)

## 2015-11-20 LAB — MRSA PCR SCREENING: MRSA by PCR: NEGATIVE

## 2015-11-20 LAB — PROTIME-INR
INR: 1
PROTHROMBIN TIME: 13.4 s (ref 11.4–15.0)

## 2015-11-20 LAB — HEPARIN LEVEL (UNFRACTIONATED): HEPARIN UNFRACTIONATED: 0.42 [IU]/mL (ref 0.30–0.70)

## 2015-11-20 LAB — APTT: APTT: 27 s (ref 24–36)

## 2015-11-20 MED ORDER — FUROSEMIDE 40 MG PO TABS
40.0000 mg | ORAL_TABLET | Freq: Every day | ORAL | Status: DC
  Administered 2015-11-21 – 2015-11-22 (×2): 40 mg via ORAL
  Filled 2015-11-20 (×2): qty 1

## 2015-11-20 MED ORDER — IPRATROPIUM-ALBUTEROL 0.5-2.5 (3) MG/3ML IN SOLN: 3.0000 mL | RESPIRATORY_TRACT | Status: DC | PRN

## 2015-11-20 MED ORDER — HEPARIN BOLUS VIA INFUSION
4000.0000 [IU] | Freq: Once | INTRAVENOUS | Status: AC
  Administered 2015-11-20: 4000 [IU] via INTRAVENOUS
  Filled 2015-11-20: qty 4000

## 2015-11-20 MED ORDER — FAMOTIDINE IN NACL 20-0.9 MG/50ML-% IV SOLN
20.0000 mg | Freq: Every day | INTRAVENOUS | Status: DC
  Administered 2015-11-20 – 2015-11-23 (×4): 20 mg via INTRAVENOUS
  Filled 2015-11-20 (×4): qty 50

## 2015-11-20 MED ORDER — INSULIN GLARGINE 100 UNIT/ML ~~LOC~~ SOLN
10.0000 [IU] | Freq: Every day | SUBCUTANEOUS | Status: DC
  Administered 2015-11-20 – 2015-11-27 (×8): 10 [IU] via SUBCUTANEOUS
  Filled 2015-11-20 (×10): qty 0.1

## 2015-11-20 MED ORDER — FINASTERIDE 5 MG PO TABS
5.0000 mg | ORAL_TABLET | Freq: Every day | ORAL | Status: DC
  Administered 2015-11-21 – 2015-11-28 (×8): 5 mg via ORAL
  Filled 2015-11-20 (×8): qty 1

## 2015-11-20 MED ORDER — ACETAMINOPHEN 325 MG PO TABS
650.0000 mg | ORAL_TABLET | Freq: Four times a day (QID) | ORAL | Status: DC | PRN
  Administered 2015-11-21 – 2015-11-25 (×2): 650 mg via ORAL
  Filled 2015-11-20 (×2): qty 2

## 2015-11-20 MED ORDER — LIDOCAINE-PRILOCAINE 2.5-2.5 % EX CREA
1.0000 "application " | TOPICAL_CREAM | CUTANEOUS | Status: DC | PRN
  Filled 2015-11-20: qty 5

## 2015-11-20 MED ORDER — ALUM & MAG HYDROXIDE-SIMETH 200-200-20 MG/5ML PO SUSP
30.0000 mL | Freq: Once | ORAL | Status: AC
  Administered 2015-11-20: 30 mL via ORAL
  Filled 2015-11-20: qty 30

## 2015-11-20 MED ORDER — FAMOTIDINE 20 MG PO TABS
40.0000 mg | ORAL_TABLET | Freq: Once | ORAL | Status: AC
  Administered 2015-11-20: 40 mg via ORAL
  Filled 2015-11-20: qty 2

## 2015-11-20 MED ORDER — ONDANSETRON HCL 4 MG/2ML IJ SOLN: 4.0000 mg | Freq: Four times a day (QID) | INTRAMUSCULAR | Status: DC | PRN

## 2015-11-20 MED ORDER — ONDANSETRON HCL 4 MG PO TABS: 4.0000 mg | ORAL_TABLET | Freq: Four times a day (QID) | ORAL | Status: DC | PRN

## 2015-11-20 MED ORDER — LEVOTHYROXINE SODIUM 50 MCG PO TABS
100.0000 ug | ORAL_TABLET | Freq: Every day | ORAL | Status: DC
  Administered 2015-11-21 – 2015-11-28 (×8): 100 ug via ORAL
  Filled 2015-11-20 (×8): qty 2

## 2015-11-20 MED ORDER — CALCIUM ACETATE (PHOS BINDER) 667 MG/5ML PO SOLN
1334.0000 mg | Freq: Three times a day (TID) | ORAL | Status: DC
  Administered 2015-11-20 – 2015-11-28 (×21): 1334 mg via ORAL
  Filled 2015-11-20 (×28): qty 10

## 2015-11-20 MED ORDER — MORPHINE SULFATE (PF) 2 MG/ML IV SOLN
2.0000 mg | INTRAVENOUS | Status: DC | PRN
  Administered 2015-11-21 (×2): 2 mg via INTRAVENOUS
  Filled 2015-11-20 (×2): qty 1

## 2015-11-20 MED ORDER — GABAPENTIN 100 MG PO CAPS
100.0000 mg | ORAL_CAPSULE | Freq: Every day | ORAL | Status: DC
  Administered 2015-11-20 – 2015-11-27 (×8): 100 mg via ORAL
  Filled 2015-11-20 (×8): qty 1

## 2015-11-20 MED ORDER — LORATADINE 10 MG PO TABS
10.0000 mg | ORAL_TABLET | Freq: Every day | ORAL | Status: DC
  Administered 2015-11-21 – 2015-11-28 (×8): 10 mg via ORAL
  Filled 2015-11-20 (×8): qty 1

## 2015-11-20 MED ORDER — HEPARIN (PORCINE) IN NACL 100-0.45 UNIT/ML-% IJ SOLN
12.0000 [IU]/kg/h | INTRAMUSCULAR | Status: DC
  Administered 2015-11-20: 12 [IU]/kg/h via INTRAVENOUS
  Filled 2015-11-20 (×3): qty 250

## 2015-11-20 MED ORDER — ACETAMINOPHEN 650 MG RE SUPP: 650.0000 mg | Freq: Four times a day (QID) | RECTAL | Status: DC | PRN

## 2015-11-20 MED ORDER — HYPROMELLOSE (GONIOSCOPIC) 2.5 % OP SOLN
1.0000 [drp] | OPHTHALMIC | Status: DC | PRN
  Filled 2015-11-20: qty 15

## 2015-11-20 MED ORDER — ALLOPURINOL 100 MG PO TABS
100.0000 mg | ORAL_TABLET | Freq: Every day | ORAL | Status: DC
  Administered 2015-11-21 – 2015-11-28 (×8): 100 mg via ORAL
  Filled 2015-11-20 (×8): qty 1

## 2015-11-20 MED ORDER — BISACODYL 10 MG RE SUPP
10.0000 mg | Freq: Every day | RECTAL | Status: DC | PRN
  Administered 2015-11-27: 10 mg via RECTAL
  Filled 2015-11-20: qty 1

## 2015-11-20 MED ORDER — PANTOPRAZOLE SODIUM 40 MG PO TBEC
40.0000 mg | DELAYED_RELEASE_TABLET | Freq: Every day | ORAL | Status: DC
Start: 2015-11-21 — End: 2015-11-28
  Administered 2015-11-21 – 2015-11-28 (×8): 40 mg via ORAL
  Filled 2015-11-20 (×8): qty 1

## 2015-11-20 MED ORDER — SODIUM CHLORIDE 0.9% FLUSH
3.0000 mL | Freq: Two times a day (BID) | INTRAVENOUS | Status: DC
  Administered 2015-11-21 – 2015-11-29 (×15): 3 mL via INTRAVENOUS

## 2015-11-20 MED ORDER — SODIUM CHLORIDE 0.9% FLUSH: 3.0000 mL | INTRAVENOUS | Status: DC | PRN

## 2015-11-20 MED ORDER — CLOPIDOGREL BISULFATE 75 MG PO TABS
75.0000 mg | ORAL_TABLET | Freq: Every day | ORAL | Status: DC
  Administered 2015-11-21 – 2015-11-28 (×8): 75 mg via ORAL
  Filled 2015-11-20 (×8): qty 1

## 2015-11-20 MED ORDER — DOCUSATE SODIUM 100 MG PO CAPS
100.0000 mg | ORAL_CAPSULE | Freq: Two times a day (BID) | ORAL | Status: DC
  Administered 2015-11-20 – 2015-11-28 (×15): 100 mg via ORAL
  Filled 2015-11-20 (×15): qty 1

## 2015-11-20 MED ORDER — IPRATROPIUM-ALBUTEROL 18-103 MCG/ACT IN AERO: 2.0000 | INHALATION_SPRAY | RESPIRATORY_TRACT | Status: DC | PRN

## 2015-11-20 MED ORDER — SODIUM CHLORIDE 0.9 % IV SOLN
250.0000 mL | INTRAVENOUS | Status: DC | PRN
  Administered 2015-11-20: 250 mL via INTRAVENOUS

## 2015-11-20 MED ORDER — ASPIRIN EC 81 MG PO TBEC
81.0000 mg | DELAYED_RELEASE_TABLET | Freq: Every day | ORAL | Status: DC
  Administered 2015-11-21 – 2015-11-28 (×8): 81 mg via ORAL
  Filled 2015-11-20 (×8): qty 1

## 2015-11-20 MED ORDER — ISOSORBIDE MONONITRATE ER 30 MG PO TB24
30.0000 mg | ORAL_TABLET | Freq: Every day | ORAL | Status: DC
  Administered 2015-11-20 – 2015-11-22 (×3): 30 mg via ORAL
  Filled 2015-11-20 (×3): qty 1

## 2015-11-20 MED ORDER — NITROGLYCERIN 0.4 MG SL SUBL
0.4000 mg | SUBLINGUAL_TABLET | SUBLINGUAL | Status: DC | PRN
  Administered 2015-11-21 (×2): 0.4 mg via SUBLINGUAL
  Filled 2015-11-20 (×2): qty 1

## 2015-11-20 MED ORDER — SIMVASTATIN 20 MG PO TABS
20.0000 mg | ORAL_TABLET | Freq: Every day | ORAL | Status: DC
  Administered 2015-11-20 – 2015-11-27 (×8): 20 mg via ORAL
  Filled 2015-11-20 (×9): qty 1

## 2015-11-20 MED ORDER — CALCIUM ACETATE (PHOS BINDER) 667 MG PO CAPS
667.0000 mg | ORAL_CAPSULE | Freq: Two times a day (BID) | ORAL | Status: DC | PRN
  Administered 2015-11-21 – 2015-11-25 (×6): 667 mg via ORAL
  Filled 2015-11-20 (×7): qty 1

## 2015-11-20 MED ORDER — FLUTICASONE PROPIONATE 50 MCG/ACT NA SUSP
2.0000 | Freq: Every day | NASAL | Status: DC
  Administered 2015-11-21 – 2015-11-27 (×7): 2 via NASAL
  Filled 2015-11-20: qty 16

## 2015-11-20 MED ORDER — INSULIN ASPART 100 UNIT/ML ~~LOC~~ SOLN
0.0000 [IU] | Freq: Three times a day (TID) | SUBCUTANEOUS | Status: DC
  Administered 2015-11-21: 3 [IU] via SUBCUTANEOUS
  Administered 2015-11-22: 1 [IU] via SUBCUTANEOUS
  Administered 2015-11-22: 2 [IU] via SUBCUTANEOUS
  Administered 2015-11-22 – 2015-11-23 (×2): 1 [IU] via SUBCUTANEOUS
  Administered 2015-11-23 – 2015-11-24 (×3): 2 [IU] via SUBCUTANEOUS
  Administered 2015-11-24: 1 [IU] via SUBCUTANEOUS
  Administered 2015-11-24 – 2015-11-25 (×4): 2 [IU] via SUBCUTANEOUS
  Administered 2015-11-26 (×2): 5 [IU] via SUBCUTANEOUS
  Administered 2015-11-26: 7 [IU] via SUBCUTANEOUS
  Administered 2015-11-27: 3 [IU] via SUBCUTANEOUS
  Administered 2015-11-27: 5 [IU] via SUBCUTANEOUS
  Administered 2015-11-27 – 2015-11-28 (×2): 2 [IU] via SUBCUTANEOUS
  Administered 2015-11-28: 5 [IU] via SUBCUTANEOUS
  Filled 2015-11-20 (×4): qty 2
  Filled 2015-11-20: qty 5
  Filled 2015-11-20: qty 2
  Filled 2015-11-20: qty 5
  Filled 2015-11-20 (×2): qty 2
  Filled 2015-11-20: qty 5
  Filled 2015-11-20 (×2): qty 3
  Filled 2015-11-20: qty 1
  Filled 2015-11-20: qty 7
  Filled 2015-11-20: qty 5
  Filled 2015-11-20 (×2): qty 1
  Filled 2015-11-20 (×3): qty 2
  Filled 2015-11-20: qty 1

## 2015-11-20 MED ORDER — SODIUM CHLORIDE 0.9% FLUSH: 3.0000 mL | Freq: Two times a day (BID) | INTRAVENOUS | Status: DC

## 2015-11-20 MED ORDER — ASPIRIN 81 MG PO CHEW
324.0000 mg | CHEWABLE_TABLET | Freq: Once | ORAL | Status: AC
  Administered 2015-11-20: 243 mg via ORAL
  Filled 2015-11-20: qty 4

## 2015-11-20 NOTE — ED Notes (Signed)
Called report to Admitting Nurse; that nurse called admitting doctor to verify if ICU bed was correct for this patient.Will call back. Charge nurse aware

## 2015-11-20 NOTE — Progress Notes (Signed)
Informed Dr. Letitia Libra third troponin resulted at 3.11 prior one at 1 was 0.30 and at 9 was 0.7. MD acknowledged.

## 2015-11-20 NOTE — H&P (Signed)
History and Physical    [name] ZOX:[id number] DOB: 1923/03/06 DOA: 11/20/2015  Referring physician: Dr. Scotty Court PCP: Marisue Ivan, MD  Specialists: none  Chief Complaint: Chest pain  HPI: Johnathan HEGGIE Sr. is a 82 y.o. male has a past medical history significant for ESRD on HD with HTN now with CP lasting 1 hour earlier today and elevated troponin in er. EKG with no acute changes. Currently pain free. Has ESRD on HD qMWF.    Review of Systems: The patient denies anorexia, fever, weight loss,, vision loss, decreased hearing, hoarseness, syncope, dyspnea on exertion, peripheral edema, balance deficits, hemoptysis, abdominal pain, melena, hematochezia, severe indigestion/heartburn, hematuria, incontinence, genital sores, muscle weakness, suspicious skin lesions, transient blindness, difficulty walking, depression, unusual weight change, abnormal bleeding, enlarged lymph nodes, angioedema, and breast masses.   Past Medical History:  Diagnosis Date  . Arthritis   . Bronchitis   . Chronic kidney disease   . Coronary artery disease   . Diabetes mellitus without complication (HCC)   . GERD (gastroesophageal reflux disease)   . Gout   . Hypercholesterolemia   . Hyperlipidemia   . Hypertension   . Hypothyroidism   . Obesity   . Shingles   . Sleep apnea    Past Surgical History:  Procedure Laterality Date  . CHOLECYSTECTOMY    . EYE SURGERY    . HERNIA REPAIR    . PERIPHERAL VASCULAR CATHETERIZATION Left 01/25/2015   Procedure: A/V Shuntogram/Fistulagram;  Surgeon: Renford Dills, MD;  Location: [facility] INVASIVE CV LAB;  Service: Cardiovascular;  Laterality: Left;  . PERIPHERAL VASCULAR CATHETERIZATION N/A 01/25/2015   Procedure: A/V Shunt Intervention;  Surgeon: Renford Dills, MD;  Location: [facility] INVASIVE CV LAB;  Service: Cardiovascular;  Laterality: N/A;   Social History:  reports that he has quit smoking. He has a 30.00 pack-year smoking history. He has never used  smokeless tobacco. He reports that he does not drink alcohol or use drugs.  Allergies  Allergen Reactions  . Cinacalcet Other (See Comments)    Reaction: unknown  . Lisinopril Cough    Family History  Problem Relation Age of Onset  . Diabetes Mellitus II Father     Prior to Admission medications   Medication Sig Start Date End Date Taking? Authorizing Provider  acetaminophen (TYLENOL) 325 MG tablet Take 650 mg by mouth every 6 (six) hours as needed for mild pain or fever.    Historical Provider, MD  albuterol-ipratropium (COMBIVENT) 18-103 MCG/ACT inhaler Inhale 2 puffs into the lungs every 4 (four) hours as needed for wheezing or shortness of breath.    Historical Provider, MD  allopurinol (ZYLOPRIM) 100 MG tablet Take 100 mg by mouth daily.    Historical Provider, MD  aspirin EC 81 MG tablet Take 81 mg by mouth daily.    Historical Provider, MD  calcium acetate (PHOSLO) 667 MG capsule Take 667 mg by mouth 2 (two) times daily between meals as needed (with snacks).    Historical Provider, MD  calcium acetate, Phos Binder, (PHOSLYRA) 667 MG/5ML SOLN Take 1,334 mg by mouth 3 (three) times daily with meals.     Historical Provider, MD  clopidogrel (PLAVIX) 75 MG tablet Take 1 tablet (75 mg total) by mouth daily. 11/03/15   [name], MD  docusate sodium (COLACE) 100 MG capsule Take 100 mg by mouth every other day.    Historical Provider, MD  finasteride (PROSCAR) 5 MG tablet Take 5 mg by mouth daily.  Historical Provider, MD  flunisolide (NASAREL) 29 MCG/ACT (0.025%) nasal spray Place 2 sprays into the nose 2 (two) times daily. Dose is for each nostril.    Historical Provider, MD  furosemide (LASIX) 40 MG tablet Take 40 mg by mouth daily.    Historical Provider, MD  gabapentin (NEURONTIN) 100 MG capsule Take 100 mg by mouth at bedtime.     Historical Provider, MD  glipiZIDE (GLUCOTROL) 5 MG tablet Take by mouth daily before breakfast.    Historical Provider, MD  hydroxypropyl  methylcellulose / hypromellose (ISOPTO TEARS / GONIOVISC) 2.5 % ophthalmic solution Place 1 drop into both eyes as needed for dry eyes.    Historical Provider, MD  insulin glargine (LANTUS) 100 UNIT/ML injection Inject 10 Units into the skin at bedtime.     Historical Provider, MD  isosorbide mononitrate (IMDUR) 30 MG 24 hr tablet Take 1 tablet (30 mg total) by mouth daily. 11/02/15   [name], MD  LACTOBACILLUS PO Take 2 mg by mouth 2 (two) times daily.     Historical Provider, MD  levothyroxine (SYNTHROID, LEVOTHROID) 100 MCG tablet Take 100 mcg by mouth daily before breakfast.    Historical Provider, MD  loratadine (CLARITIN) 10 MG tablet Take 10 mg by mouth daily.    Historical Provider, MD  omega-3 acid ethyl esters (LOVAZA) 1 g capsule Take 1 g by mouth 2 (two) times daily.    Historical Provider, MD  omeprazole (PRILOSEC) 40 MG capsule Take 1 capsule (40 mg total) by mouth 2 (two) times daily. 11/02/15   [name], MD  simvastatin (ZOCOR) 20 MG tablet Take 20 mg by mouth daily at 6 PM.    Historical Provider, MD   Physical Exam: Vitals:   11/20/15 1030 11/20/15 1045 11/20/15 1100 11/20/15 1115  BP: 118/74  121/72   Pulse:   (!) 102 (!) 101  Resp: 20 19 20 14   Temp:      TempSrc:      SpO2:   94% 90%  Weight:      Height:         General:  No apparent distress, WDWN, [place]/ AT  Eyes: PERRL, EOMI, no scleral icterus, conjunctiva clear  ENT: moist oropharynx without exudate, TM's benign, dentition fair  Neck: supple, no lymphadenopathy. No bruits or thyromegaly  Cardiovascular: regular rate with 2/6 systolic murmur noted.  No rubs or gallops.; 2+ peripheral pulses, no JVD, 1+ peripheral edema  Respiratory: faint basilar rales, good air movement without wheezing, rhonchi . No dullness. Respiratory effort normal  Abdomen: soft, non tender to palpation, positive bowel sounds, no guarding, no rebound  Skin: no rashes or lesions  Musculoskeletal: normal bulk and tone, no  joint swelling  Psychiatric: normal mood and affect, A&OX3  Neurologic: CN 2-12 grossly intact, Motor strength 5/5 in all 4 groups with symmetric DTR's and non-focal sensory exam.  Labs on Admission:  Basic Metabolic Panel:  Recent Labs Lab 11/20/15 0906  NA 137  K 3.6  CL 96*  CO2 28  GLUCOSE 260*  BUN 30*  CREATININE 6.73*  CALCIUM 10.1   Liver Function Tests: No results for input(s): AST, ALT, ALKPHOS, BILITOT, PROT, ALBUMIN in the last 168 hours. No results for input(s): LIPASE, AMYLASE in the last 168 hours. No results for input(s): AMMONIA in the last 168 hours. CBC:  Recent Labs Lab 11/20/15 0906  WBC 9.5  HGB 12.6*  HCT 36.3*  MCV 104.4*  PLT 150   Cardiac Enzymes:  Recent  Labs Lab 11/20/15 0906 11/20/15 1250  TROPONINI 0.07* 0.30*    BNP (last 3 results) No results for input(s): BNP in the last 8760 hours.  ProBNP (last 3 results) No results for input(s): PROBNP in the last 8760 hours.  CBG: No results for input(s): GLUCAP in the last 168 hours.  Radiological Exams on Admission: No results found.  EKG: Independently reviewed.  Assessment/Plan Principal Problem:   NSTEMI (non-ST elevated myocardial infarction) [facility]) Active Problems:   Chest pain   ESRD on hemodialysis (HCC)   HTN, goal below 140/80   Pt is FULL CODE and has been started on a heparin drip by the ER MD. Will admit to stepdown and follow enzymes. Order echo. Consult Cardiology for cath and Nephrology for HD. Repeat labs in AM  Diet: clear liquids Fluids: saline lock DVT Prophylaxis: IV Heparin  Code Status: FULL  Family Communication: yes  Disposition Plan: home  Time spent: 50 min

## 2015-11-20 NOTE — ED Notes (Signed)
Notified Dr. Shaune Pollack of elevated troponin

## 2015-11-20 NOTE — ED Provider Notes (Signed)
[facility] Emergency Department Provider Note  ____________________________________________  Time seen: 12:00 PM  I have reviewed the triage vital signs and the nursing notes.   HISTORY  Chief Complaint Chest Pain    HPI [name]. is a 82 y.o. male who complains of central chest pain, nonradiating, no shortness of breath diaphoresis dizziness or syncope or vomiting. Gradual onset at about 8:00 this morning after eating a Vilma Meckel breakfast sausage biscuit with an egg and sitting in his recliner. No exertional symptoms, not pleuritic. Unable to describe the quality of the pain. Pain briefly worsened, and is now feeling better currently about 2 out of 10. He was recently seen in the hospital about 2 or 3 weeks ago for similar symptoms, increased on PPI, scheduled for stress test which was unable to be performed in the office due to difficulty obtaining IV access at the time. He was started on Plavix at that time is continue taking baby aspirin as well.   Patient is on dialysis Monday Wednesday Friday. Last dialysis was on Friday 2 days ago and is planning to go tomorrow at his usual session.  Past Medical History:  Diagnosis Date  . Arthritis   . Bronchitis   . Chronic kidney disease   . Coronary artery disease   . Diabetes mellitus without complication (HCC)   . GERD (gastroesophageal reflux disease)   . Gout   . Hypercholesterolemia   . Hyperlipidemia   . Hypertension   . Hypothyroidism   . Obesity   . Shingles   . Sleep apnea      Patient Active Problem List   Diagnosis Date Noted  . Chest pain 11/02/2015     Past Surgical History:  Procedure Laterality Date  . CHOLECYSTECTOMY    . EYE SURGERY    . HERNIA REPAIR    . PERIPHERAL VASCULAR CATHETERIZATION Left 01/25/2015   Procedure: A/V Shuntogram/Fistulagram;  Surgeon: Renford Dills, MD;  Location: [facility] INVASIVE CV LAB;  Service: Cardiovascular;  Laterality: Left;  . PERIPHERAL  VASCULAR CATHETERIZATION N/A 01/25/2015   Procedure: A/V Shunt Intervention;  Surgeon: Renford Dills, MD;  Location: [facility] INVASIVE CV LAB;  Service: Cardiovascular;  Laterality: N/A;     Current Outpatient Rx  . Order #: [id number] Class: Historical Med  . Order #: [id number] Class: Historical Med  . Order #: [id number] Class: Historical Med  . Order #: [id number] Class: Historical Med  . Order #: [id number] Class: Historical Med  . Order #: [id number] Class: Historical Med  . Order #: [id number] Class: Print  . Order #: [id number] Class: Historical Med  . Order #: [id number] Class: Historical Med  . Order #: [id number] Class: Historical Med  . Order #: [id number] Class: Historical Med  . Order #: [id number] Class: Historical Med  . Order #: [id number] Class: Historical Med  . Order #: [id number] Class: Historical Med  . Order #: [id number] Class: Historical Med  . Order #: [id number] Class: Print  . Order #: [id number] Class: Historical Med  . Order #: [id number] Class: Historical Med  . Order #: [id number] Class: Historical Med  . Order #: [id number] Class: Historical Med  . Order #: [id number] Class: Print  . Order #: [id number] Class: Historical Med     Allergies Cinacalcet and Lisinopril   Family History  Problem Relation Age of Onset  . Diabetes Mellitus II Father     Social History Social History  Substance Use Topics  . Smoking status: Former Smoker    Packs/day: 2.00    Years: 15.00  . Smokeless tobacco: Never Used  . Alcohol  use No    Review of Systems  Constitutional:   No fever or chills.  ENT:   No sore throat. No rhinorrhea. Cardiovascular:   Positive as above chest pain. Respiratory:   No dyspnea or cough. Gastrointestinal:   Negative for abdominal pain, vomiting and diarrhea.  Genitourinary:   Negative for dysuria or difficulty urinating. Musculoskeletal:   Negative for focal pain or swelling Neurological:   Negative for headaches 10-point ROS otherwise  negative.  ____________________________________________   PHYSICAL EXAM:  VITAL SIGNS: ED Triage Vitals  Enc Vitals Group     BP 11/20/15 0914 128/76     Pulse Rate 11/20/15 0914 (!) 104     Resp 11/20/15 0914 20     Temp 11/20/15 0914 97.5 F (36.4 C)     Temp Source 11/20/15 0914 Oral     SpO2 11/20/15 0914 97 %     Weight 11/20/15 0914 194 lb (88 kg)     Height 11/20/15 0914  (1.676 m)     Head Circumference --      Peak Flow --      Pain Score 11/20/15 0858 0     Pain Loc --      Pain Edu? --      Excl. in GC? --     Vital signs reviewed, nursing assessments reviewed.   Constitutional:   Alert and oriented. Well appearing and in no distress. Eyes:   No scleral icterus. No conjunctival pallor. PERRL. EOMI.  No nystagmus. ENT   Head:   Normocephalic and atraumatic.   Nose:   No congestion/rhinnorhea. No septal hematoma   Mouth/Throat:   MMM, no pharyngeal erythema. No peritonsillar mass.    Neck:   No stridor. No SubQ emphysema. No meningismus. Hematological/Lymphatic/Immunilogical:   No cervical lymphadenopathy. Cardiovascular:   RRR. Symmetric bilateral radial and DP pulses.  No murmurs.  Respiratory:   Normal respiratory effort without tachypnea nor retractions. Breath sounds are clear and equal bilaterally. No wheezes/rales/rhonchi. Gastrointestinal:   Soft and nontender. Non distended. There is no CVA tenderness.  No rebound, rigidity, or guarding. Genitourinary:   deferred Musculoskeletal:   Nontender with normal range of motion in all extremities. No joint effusions.  No lower extremity tenderness.  No edema. There is anterior chest wall pain over the inferior sternum which reproduces his symptoms. Also some mild epigastric tenderness. Neurologic:   Normal speech and language.  CN 2-10 normal. Motor grossly intact. No gross focal neurologic deficits are appreciated.  Skin:    Skin is warm, dry and intact. No rash noted.  No petechiae, purpura,  or bullae.  ____________________________________________    LABS (pertinent positives/negatives) (all labs ordered are listed, but only abnormal results are displayed) Labs Reviewed  BASIC METABOLIC PANEL - Abnormal; Notable for the following:       Result Value   Chloride 96 (*)    Glucose, Bld 260 (*)    BUN 30 (*)    Creatinine, Ser 6.73 (*)    GFR calc non Af Amer 6 (*)    GFR calc Af Amer 7 (*)    All other components within normal limits  CBC - Abnormal; Notable for the following:    RBC 3.48 (*)    Hemoglobin 12.6 (*)    HCT 36.3 (*)    MCV 104.4 (*)    MCH 36.2 (*)    RDW 14.6 (*)    All other components within normal limits  TROPONIN I - Abnormal;  Notable for the following:    Troponin I 0.07 (*)    All other components within normal limits  TROPONIN I - Abnormal; Notable for the following:    Troponin I 0.30 (*)    All other components within normal limits  APTT  PROTIME-INR  HEPARIN LEVEL (UNFRACTIONATED)   ____________________________________________   EKG  Sinus tachycardia rate 101. Left axis. Normal intervals. Left bundle branch block. No acute ischemic changes. CRITERIA for LVH in the high lateral leads.  ____________________________________________    RADIOLOGY    ____________________________________________   PROCEDURES .Critical Care Performed by: Sharman Cheek Authorized by: Sharman Cheek   Critical care provider statement:    Critical care time (minutes):  35   Critical care time was exclusive of:  Separately billable procedures and treating other patients   Critical care was necessary to treat or prevent imminent or life-threatening deterioration of the following conditions:  Cardiac failure, circulatory failure and shock   Critical care was time spent personally by me on the following activities:  Development of treatment plan with patient or surrogate, discussions with consultants, evaluation of patient's response to  treatment, examination of patient, obtaining history from patient or surrogate, ordering and performing treatments and interventions, ordering and review of laboratory studies, ordering and review of radiographic studies, pulse oximetry, re-evaluation of patient's condition and review of old charts   I assumed direction of critical care for this patient from another provider in my specialty: no      ____________________________________________   INITIAL IMPRESSION / ASSESSMENT AND PLAN / ED COURSE  Pertinent labs & imaging results that were available during my care of the patient were reviewed by me and considered in my medical decision making (see chart for details).  Patient well appearing no acute distress. Presents with atypical chest pain similar to symptoms that he was recently evaluated in the hospital for 18 days ago. At that time he was scheduled for outpatient stress test, however per the patient's report or unable to obtain IV access in the cardiology clinic and therefore he was unable to have the stress test performed. His cardiologist did increase him to twice a day Protonix in response to these symptoms. I will give GI cocktail for now, repeat the troponin as it appears to be stable with his chronic baseline which is expected in the setting of his end-stage renal failure.Considering the patient's symptoms, medical history, and physical examination today, I have low suspicion for ACS, PE, TAD, pneumothorax, carditis, mediastinitis, pneumonia, CHF, or sepsis.       Clinical Course  Value Comment By Time   Troponin reviewed. Significant elevation in 3 hour interval. Heparin started. Cardiologist paged for further recommendations, plan to admit for anticoagulation. Patient is full code. Sharman Cheek, MD 07/23 1419  Troponin I: (!!) 0.30 (Reviewed) Sharman Cheek, MD 07/23 1420      ____________________________________________   FINAL CLINICAL IMPRESSION(S) / ED  DIAGNOSES  Final diagnoses:  NSTEMI (non-ST elevated myocardial infarction) [facility])  Atypical chest pain       Portions of this note were generated with dragon dictation software. Dictation errors may occur despite best attempts at proofreading.    Sharman Cheek, MD 11/20/15 [id number]

## 2015-11-20 NOTE — Progress Notes (Signed)
°   11/20/15 1800  Clinical Encounter Type  Visited With Patient and family together  Visit Type Initial  Referral From Nurse;Family  Consult/Referral To Chaplain  Spiritual Encounters  Spiritual Needs Prayer  Stress Factors  Patient Stress Factors None identified  Family Stress Factors None identified  Advance Directives (For Healthcare)  Does patient have an advance directive? Yes  Patient requested prayer, seems cognitively sharp and in good spirits from an emotional standpoint.

## 2015-11-20 NOTE — Progress Notes (Signed)
ANTICOAGULATION CONSULT NOTE - Initial Consult  Pharmacy Consult for heparin drip Indication: chest pain/ACS  Allergies  Allergen Reactions  . Cinacalcet Other (See Comments)    Reaction: unknown  . Lisinopril Cough    Patient Measurements: Height: 5\' 6"  (167.6 cm) Weight: 194 lb (88 kg) IBW/kg (Calculated) : 63.8 Heparin Dosing Weight: 80kg  Vital Signs: Temp: 97.5 F (36.4 C) (07/23 0914) Temp Source: Oral (07/23 0914) BP: 121/72 (07/23 1100) Pulse Rate: 101 (07/23 1115)  Labs:  Recent Labs  11/20/15 0906 11/20/15 1250  HGB 12.6*  --   HCT 36.3*  --   PLT 150  --   CREATININE 6.73*  --   TROPONINI 0.07* 0.30*    Estimated Creatinine Clearance: 7.4 mL/min (by C-G formula based on SCr of 6.73 mg/dL).   Medical History: Past Medical History:  Diagnosis Date  . Arthritis   . Bronchitis   . Chronic kidney disease   . Coronary artery disease   . Diabetes mellitus without complication (HCC)   . GERD (gastroesophageal reflux disease)   . Gout   . Hypercholesterolemia   . Hyperlipidemia   . Hypertension   . Hypothyroidism   . Obesity   . Shingles   . Sleep apnea    Assessment: 82 yo male with chest pain and elevated troponin. Pharmacy consulted for heparin drip for ACS/Chest pain    Goal of Therapy:  Heparin level 0.3-0.7 units/ml Monitor platelets by anticoagulation protocol: Yes   Plan:  Give 4000 units bolus x 1 Start heparin infusion at 900 units/hr Check anti-Xa level in 8 hours and daily while on heparin Continue to monitor H&H and platelets  Cher Nakai, PharmD Clinical Pharmacist 11/20/2015 2:20 PM

## 2015-11-20 NOTE — Progress Notes (Signed)
[facility]  ROUNDING NOTE   Subjective:  Patient admitted with chest pain and very mild troponin elevation. Non exertional Patient well known to [place] from prior admissions. Denies shortness of breath. Room air at present [facility] to dialysis on Friday.  Treatment was uneventful     Objective:  Vital signs in last 24 hours:  Temp:  [97.5 F (36.4 C)] 97.5 F (36.4 C) (07/23 0914) Pulse Rate:  [97-104] 97 (07/23 1330) Resp:  [14-25] 22 (07/23 1530) BP: (105-128)/(64-88) 114/70 (07/23 1530) SpO2:  [90 %-99 %] 95 % (07/23 1330) Weight:  [88 kg (194 lb)] 88 kg (194 lb) (07/23 0914)  Weight change:  Filed Weights   11/20/15 0914  Weight: 88 kg (194 lb)    Intake/Output: No intake/output data recorded.   Intake/Output this shift:  No intake/output data recorded.  Physical Exam: General: NAD, resting in bed  Head: Normocephalic, atraumatic. dry oral mucosal membranes  Eyes: Anicteric  Neck: Supple, trachea midline  Lungs:  Room air,  normal effort, mild basilar crackles b/l  Heart: Regular rate and rhythm, crescendo systolic murmur  Abdomen:  Soft, nontender, BS present  Extremities: trace peripheral edema.  Neurologic: Nonfocal, moving all four extremities  Skin: No lesions  Access: LUE AVG, good thrill    Basic Metabolic Panel:  Recent Labs Lab 11/20/15 0906  NA 137  K 3.6  CL 96*  CO2 28  GLUCOSE 260*  BUN 30*  CREATININE 6.73*  CALCIUM 10.1    Liver Function Tests: No results for input(s): AST, ALT, ALKPHOS, BILITOT, PROT, ALBUMIN in the last 168 hours. No results for input(s): LIPASE, AMYLASE in the last 168 hours. No results for input(s): AMMONIA in the last 168 hours.  CBC:  Recent Labs Lab 11/20/15 0906  WBC 9.5  HGB 12.6*  HCT 36.3*  MCV 104.4*  PLT 150    Cardiac Enzymes:  Recent Labs Lab 11/20/15 0906 11/20/15 1250  TROPONINI 0.07* 0.30*    BNP: Invalid input(s): POCBNP  CBG: No results for input(s): GLUCAP in  the last 168 hours.  Microbiology: Results for orders placed or performed during the hospital encounter of 08/14/14  Clostridium Difficile [facility])     Status: None   Collection Time: 08/15/14 10:00 AM  Result Value Ref Range Status   Micro Text Report   Final       C.DIFFICILE ANTIGEN       C.DIFFICILE GDH ANTIGEN : NEGATIVE   C.DIFFICILE TOXIN A/B     C.DIFFICILE TOXINS A AND B : NEGATIVE   INTERPRETATION            Negative for C. difficile.    ANTIBIOTIC                                                        Coagulation Studies:  Recent Labs  11/20/15 1434  LABPROT 13.4  INR 1.00    Urinalysis: No results for input(s): COLORURINE, LABSPEC, PHURINE, GLUCOSEU, HGBUR, BILIRUBINUR, KETONESUR, PROTEINUR, UROBILINOGEN, NITRITE, LEUKOCYTESUR in the last 72 hours.  Invalid input(s): APPERANCEUR    Imaging: No results found.   Medications:   . famotidine (PEPCID) IV    . heparin 12 Units/kg/hr (11/20/15 1504)     nitroGLYCERIN  Assessment/ Plan:  82 y.o. male on hemodialysis Monday, Wednesday, Friday followed by  [facility] nephrology, anemia of chronic kidney disease, secondary hyperparathyroid is him, hypertension, hyperlipidemia, diabetes mellitus type 2, hypothyroidism, coronary artery disease, sleep apnea, GERD admitted for chest pain.  [facility] nephrology/MWF/[address]   1. End-stage renal disease on dialysis MWF.  Continue dialysis on MWF schedule. - no acute indication of HD at present. Will place orders for HD for tomorrow  2. Anemia of chronic kidney disease.  Hemoglobin currently 12.6. Hold off on Aranesp.  3. Secondary hyperparathyroidism. Monitor phos Continue calcium acetate.  4. Chest pain: As per hospitalist and cardiology consultation.   LOS: 0 Dequavious Harshberger 7/23/20173:54 PM

## 2015-11-20 NOTE — Progress Notes (Signed)
ANTICOAGULATION CONSULT NOTE - Initial Consult  Pharmacy Consult for heparin drip Indication: chest pain/ACS  Allergies  Allergen Reactions  . Cinacalcet Other (See Comments)    Reaction: unknown  . Lisinopril Cough    Patient Measurements: Height: 5\' 6"  (167.6 cm) Weight: 197 lb 1.5 oz (89.4 kg) IBW/kg (Calculated) : 63.8 Heparin Dosing Weight: 80kg  Vital Signs: Temp: 97.6 F (36.4 C) (07/23 1922) Temp Source: Oral (07/23 1922) BP: 121/77 (07/23 1800) Pulse Rate: 85 (07/23 1800)  Labs:  Recent Labs  11/20/15 0906 11/20/15 1250 11/20/15 1434 11/20/15 1849 11/20/15 2250  HGB 12.6*  --   --   --   --   HCT 36.3*  --   --   --   --   PLT 150  --   --   --   --   APTT  --   --  27  --   --   LABPROT  --   --  13.4  --   --   INR  --   --  1.00  --   --   HEPARINUNFRC  --   --   --   --  0.42  CREATININE 6.73*  --   --   --   --   TROPONINI 0.07* 0.30*  --  3.11*  --     Estimated Creatinine Clearance: 7.5 mL/min (by C-G formula based on SCr of 6.73 mg/dL).   Medical History: Past Medical History:  Diagnosis Date  . Arthritis   . Bronchitis   . Chronic kidney disease   . Coronary artery disease   . Diabetes mellitus without complication (HCC)   . GERD (gastroesophageal reflux disease)   . Gout   . Hypercholesterolemia   . Hyperlipidemia   . Hypertension   . Hypothyroidism   . Obesity   . Shingles   . Sleep apnea    Assessment: 82 yo male with chest pain and elevated troponin. Pharmacy consulted for heparin drip for ACS/Chest pain    Goal of Therapy:  Heparin level 0.3-0.7 units/ml Monitor platelets by anticoagulation protocol: Yes   Plan:  Heparin level therapeutic x 1. Continue current rate. Will recheck level in 8 hours.  Kyona Chauncey A. [place], [place].D., BCPS Clinical Pharmacist 11/20/2015 11:52 PM

## 2015-11-20 NOTE — ED Triage Notes (Signed)
Pt presents to ED from home via EMS that started this morning. Pt has centralized chest pain.

## 2015-11-21 ENCOUNTER — Inpatient Hospital Stay: Payer: Medicare Other

## 2015-11-21 ENCOUNTER — Inpatient Hospital Stay
Admit: 2015-11-21 | Discharge: 2015-11-21 | Disposition: A | Discharge status: Home / Self Care | Payer: Medicare Other | Attending: Internal Medicine | Admitting: Internal Medicine

## 2015-11-21 LAB — CBC
HEMATOCRIT: 38.3 % — AB (ref 40.0–52.0)
Hemoglobin: 12.7 g/dL — ABNORMAL LOW (ref 13.0–18.0)
MCH: 34.5 pg — ABNORMAL HIGH (ref 26.0–34.0)
MCHC: 33.2 g/dL (ref 32.0–36.0)
MCV: 103.9 fL — ABNORMAL HIGH (ref 80.0–100.0)
PLATELETS: 173 10*3/uL (ref 150–440)
RBC: 3.69 MIL/uL — ABNORMAL LOW (ref 4.40–5.90)
RDW: 14.8 % — AB (ref 11.5–14.5)
WBC: 13.3 10*3/uL — AB (ref 3.8–10.6)

## 2015-11-21 LAB — PHOSPHORUS: Phosphorus: 2.4 mg/dL — ABNORMAL LOW (ref 2.5–4.6)

## 2015-11-21 LAB — ECHOCARDIOGRAM COMPLETE
HEIGHTINCHES: 66 in
Weight: 3153.46 oz

## 2015-11-21 LAB — COMPREHENSIVE METABOLIC PANEL
ALBUMIN: 2.9 g/dL — AB (ref 3.5–5.0)
ALT: 14 U/L — ABNORMAL LOW (ref 17–63)
ANION GAP: 15 (ref 5–15)
AST: 44 U/L — AB (ref 15–41)
Alkaline Phosphatase: 48 U/L (ref 38–126)
BUN: 27 mg/dL — AB (ref 6–20)
CHLORIDE: 93 mmol/L — AB (ref 101–111)
CO2: 25 mmol/L (ref 22–32)
Calcium: 9.2 mg/dL (ref 8.9–10.3)
Creatinine, Ser: 5.79 mg/dL — ABNORMAL HIGH (ref 0.61–1.24)
GFR calc Af Amer: 9 mL/min — ABNORMAL LOW (ref >60)
GFR calc non Af Amer: 8 mL/min — ABNORMAL LOW (ref >60)
GLUCOSE: 146 mg/dL — AB (ref 65–99)
POTASSIUM: 3.9 mmol/L (ref 3.5–5.1)
SODIUM: 133 mmol/L — AB (ref 135–145)
TOTAL PROTEIN: 6 g/dL — AB (ref 6.5–8.1)
Total Bilirubin: 0.5 mg/dL (ref 0.3–1.2)

## 2015-11-21 LAB — GLUCOSE, CAPILLARY
GLUCOSE-CAPILLARY: 161 mg/dL — AB (ref 65–99)
GLUCOSE-CAPILLARY: 148 mg/dL — AB (ref 65–99)
Glucose-Capillary: 102 mg/dL — ABNORMAL HIGH (ref 65–99)
Glucose-Capillary: 210 mg/dL — ABNORMAL HIGH (ref 65–99)

## 2015-11-21 LAB — TROPONIN I
TROPONIN I: 6.56 ng/mL — AB (ref <0.03)
Troponin I: 7.07 ng/mL (ref <0.03)

## 2015-11-21 LAB — HEPARIN LEVEL (UNFRACTIONATED): HEPARIN UNFRACTIONATED: 0.25 [IU]/mL — AB (ref 0.30–0.70)

## 2015-11-21 MED ORDER — MORPHINE SULFATE (PF) 2 MG/ML IV SOLN: 1.0000 mg | INTRAVENOUS | Status: DC | PRN

## 2015-11-21 MED ORDER — HEPARIN BOLUS VIA INFUSION
1200.0000 [IU] | Freq: Once | INTRAVENOUS | Status: AC
  Administered 2015-11-21: 1200 [IU] via INTRAVENOUS
  Filled 2015-11-21: qty 1200

## 2015-11-21 MED ORDER — HEPARIN (PORCINE) IN NACL 100-0.45 UNIT/ML-% IJ SOLN
1250.0000 [IU]/h | INTRAMUSCULAR | Status: DC
  Administered 2015-11-22 – 2015-11-24 (×3): 1250 [IU]/h via INTRAVENOUS
  Filled 2015-11-21 (×6): qty 250

## 2015-11-21 MED ORDER — PERFLUTREN LIPID MICROSPHERE
1.0000 mL | INTRAVENOUS | Status: AC | PRN
  Filled 2015-11-21: qty 10

## 2015-11-21 MED ORDER — OXYCODONE-ACETAMINOPHEN 5-325 MG PO TABS
1.0000 | ORAL_TABLET | Freq: Four times a day (QID) | ORAL | Status: DC | PRN
  Administered 2015-11-21 – 2015-11-22 (×2): 1 via ORAL
  Filled 2015-11-21 (×2): qty 1

## 2015-11-21 MED ORDER — CARVEDILOL 3.125 MG PO TABS
3.1250 mg | ORAL_TABLET | Freq: Two times a day (BID) | ORAL | Status: DC
  Administered 2015-11-21 – 2015-11-22 (×2): 3.125 mg via ORAL
  Filled 2015-11-21 (×2): qty 1

## 2015-11-21 NOTE — Progress Notes (Signed)
Patient complained of chest pain x 2 and was medicated with morphine 2 mg x 2. Anxious to have his "surgery" pt referring to cardiac catherization scheduled today with Dr. Welton Flakes.

## 2015-11-21 NOTE — Progress Notes (Addendum)
[employer] -  at [facility]   PATIENT NAME: [name]    MR#:  [id number]  DATE OF BIRTH:  1922/06/23  SUBJECTIVE:   Patient was having chest pain earlier this morning. It is nonradiating. He initially said it was 8 out of 10. It is relieved with nitroglycerin.  REVIEW OF SYSTEMS:    Review of Systems  Constitutional: Negative.  Negative for chills, fever and malaise/fatigue.  HENT: Negative.  Negative for ear discharge, ear pain, hearing loss, nosebleeds and sore throat.   Eyes: Negative.  Negative for blurred vision and pain.  Respiratory: Negative.  Negative for cough, hemoptysis, shortness of breath and wheezing.   Cardiovascular: Positive for chest pain. Negative for palpitations and leg swelling.  Gastrointestinal: Negative.  Negative for abdominal pain, blood in stool, diarrhea, nausea and vomiting.  Genitourinary: Negative.  Negative for dysuria.  Musculoskeletal: Negative.  Negative for back pain.  Skin: Negative.   Neurological: Negative for dizziness, tremors, speech change, focal weakness, seizures and headaches.  Endo/Heme/Allergies: Negative.  Does not bruise/bleed easily.  Psychiatric/Behavioral: Negative.  Negative for depression, hallucinations and suicidal ideas.    Tolerating Diet: yes      DRUG ALLERGIES:   Allergies  Allergen Reactions  . Cinacalcet Other (See Comments)    Reaction: unknown  . Lisinopril Cough    VITALS:  Blood pressure (!) 96/53, pulse 100, temperature 97.4 F (36.3 C), temperature source Axillary, resp. rate 15, height 5\' 6"  (1.676 m), weight 89.4 kg (197 lb 1.5 oz), SpO2 95 %.  PHYSICAL EXAMINATION:   Physical Exam  Constitutional: He is oriented to person, place, and time and well-developed, well-nourished, and in no distress. No distress.  HENT:  Head: Normocephalic.  Eyes: No scleral icterus.  Neck: Normal range of motion. Neck supple. No JVD present. No tracheal deviation present.   Cardiovascular: Normal rate and regular rhythm.  Exam reveals no gallop and no friction rub.   Murmur heard. Pulmonary/Chest: Effort normal and breath sounds normal. No respiratory distress. He has no wheezes. He has no rales. He exhibits no tenderness.  Abdominal: Soft. Bowel sounds are normal. He exhibits no distension and no mass. There is no tenderness. There is no rebound and no guarding.  Musculoskeletal: Normal range of motion. He exhibits no edema.  Neurological: He is alert and oriented to person, place, and time.  Skin: Skin is warm. No rash noted. No erythema.  Psychiatric: Affect and judgment normal.      LABORATORY PANEL:   CBC  Recent Labs Lab 11/20/15 0906  WBC 9.5  HGB 12.6*  HCT 36.3*  PLT 150   ------------------------------------------------------------------------------------------------------------------  Chemistries   Recent Labs Lab 11/20/15 0906  NA 137  K 3.6  CL 96*  CO2 28  GLUCOSE 260*  BUN 30*  CREATININE 6.73*  CALCIUM 10.1   ------------------------------------------------------------------------------------------------------------------  Cardiac Enzymes  Recent Labs Lab 11/20/15 1250 11/20/15 1849 11/21/15 0047  TROPONINI 0.30* 3.11* 7.07*   ------------------------------------------------------------------------------------------------------------------  RADIOLOGY:  No results found.   ASSESSMENT AND PLAN:   82 year old male with ESRD on hemodialysis, anemia of chronic disease, diabetes and CAD who presented with chest pain and found to have non-STEMI.   1. Non-ST elevation MI: Last troponin is 7.07. Continue heparin drip. Continue aspirin, Plavix, isosorbide, low-dose Coreg and statin Order echocardiogram. Follow up on cardiology consult. Continue nitroglycerin/morphine for pain.  2. ESRD and hemodialysis: Continue hemodialysis Monday, Wednesday and Friday.  3. Diabetes: Continue sliding scale insulin and  Lantus.  4. BPH: Continue finasteride.   Management plans discussed with the patient and he is in agreement.  CODE STATUS: full  TOTAL TIME TAKING CARE OF THIS PATIENT: 35 minutes.     POSSIBLE D/C 2-3 days, DEPENDING ON CLINICAL CONDITION.   Cele Mote M.D on 11/21/2015 at 10:58 AM  Between 7am to 6pm - Pager - [phone] After 6pm go to [email or web address] - password [email or web address]  [facility]  Office  [phone]  CC: Primary care physician; Johnathan Ivan, MD  Note: This dictation was prepared with Dragon dictation along with smaller phrase technology. Any transcriptional errors that result from this process are unintentional.

## 2015-11-21 NOTE — Care Management Note (Signed)
Case Management Note  Patient Details  Name: VENUS RUHE Sr. MRN: [id number] Date of Birth: 10/28/1922  Subjective/Objective:                  Met with patient and his great-granddaughter Callie. He is from home with his wife. He is dependent on walker for ambulation but uses a wheelchair and ACTA to get to [place] [facility]. His PCP is Dr. Netty Starring however he is affiliated with [facility] and they have been notified of patient admission. He has had home health in the past but doesn't recall agency name. O2 is acute.  Action/Plan: RNCM will continue to follow and will provide option to transfer to [facility] forms. He wishes to remain at [facility].   Expected Discharge Date:                  Expected Discharge Plan:     In-House Referral:     Discharge planning Services  CM Consult  Post Acute Care Choice:    Choice offered to:  Patient, Adult Children  DME Arranged:    DME Agency:     HH Arranged:    [place] Agency:     Status of Service:  In process, will continue to follow  If discussed at Long Length of Stay Meetings, dates discussed:    Additional Comments:  Marshell Garfinkel, RN 11/21/2015, 12:16 PM

## 2015-11-21 NOTE — Progress Notes (Signed)
IJ line approved for access per Dr. Cherylann Ratel.

## 2015-11-21 NOTE — Progress Notes (Signed)
Pre hd 

## 2015-11-21 NOTE — Progress Notes (Signed)
PRE HD   

## 2015-11-21 NOTE — Progress Notes (Signed)
Post hd vitals 

## 2015-11-21 NOTE — Progress Notes (Signed)
Start of HD tx 

## 2015-11-21 NOTE — Progress Notes (Signed)
[facility]  ROUNDING NOTE   Subjective:  Patient resting comfortably in bed. States that he has mild chest pain that is decreasing. He is due for hemodialysis today.    Objective:  Vital signs in last 24 hours:  Temp:  [97.4 F (36.3 C)-97.9 F (36.6 C)] 97.4 F (36.3 C) (07/24 0200) Pulse Rate:  [78-102] 100 (07/24 0800) Resp:  [13-25] 15 (07/24 0800) BP: (96-123)/(45-110) 96/53 (07/24 0800) SpO2:  [90 %-99 %] 95 % (07/24 0800) Weight:  [89.4 kg (197 lb 1.5 oz)] 89.4 kg (197 lb 1.5 oz) (07/23 1647)  Weight change:  Filed Weights   11/20/15 0914 11/20/15 1647  Weight: 88 kg (194 lb) 89.4 kg (197 lb 1.5 oz)    Intake/Output: I/O last 3 completed shifts: In: 379.9 [P.O.:60; I.V.:269.9; IV Piggyback:50] Out: -    Intake/Output this shift:  Total I/O In: 38 [I.V.:38] Out: -   Physical Exam: General: NAD, resting in bed  Head: Normocephalic, atraumatic. Moist oral mucosal membranes  Eyes: Anicteric  Neck: Supple, trachea midline  Lungs:  Room air,  normal effort, mild basilar crackles b/l  Heart: Regular rate and rhythm, crescendo systolic murmur  Abdomen:  Soft, nontender, BS present  Extremities: trace peripheral edema.  Neurologic: Nonfocal, moving all four extremities  Skin: No lesions  Access: LUE AVG, good thrill    Basic Metabolic Panel:  Recent Labs Lab 11/20/15 0906  NA 137  K 3.6  CL 96*  CO2 28  GLUCOSE 260*  BUN 30*  CREATININE 6.73*  CALCIUM 10.1    Liver Function Tests: No results for input(s): AST, ALT, ALKPHOS, BILITOT, PROT, ALBUMIN in the last 168 hours. No results for input(s): LIPASE, AMYLASE in the last 168 hours. No results for input(s): AMMONIA in the last 168 hours.  CBC:  Recent Labs Lab 11/20/15 0906  WBC 9.5  HGB 12.6*  HCT 36.3*  MCV 104.4*  PLT 150    Cardiac Enzymes:  Recent Labs Lab 11/20/15 0906 11/20/15 1250 11/20/15 1849 11/21/15 0047  TROPONINI 0.07* 0.30* 3.11* 7.07*     BNP: Invalid input(s): POCBNP  CBG:  Recent Labs Lab 11/20/15 1646 11/20/15 2139 11/21/15 0738  GLUCAP 91 116* 102*    Microbiology: Results for orders placed or performed during the hospital encounter of 11/20/15  MRSA PCR Screening     Status: None   Collection Time: 11/20/15  4:45 PM  Result Value Ref Range Status   MRSA by PCR NEGATIVE NEGATIVE Final    Comment:        The GeneXpert MRSA Assay (FDA approved for NASAL specimens only), is one component of a comprehensive MRSA colonization surveillance program. It is not intended to diagnose MRSA infection nor to guide or monitor treatment for MRSA infections.     Coagulation Studies:  Recent Labs  11/20/15 1434  LABPROT 13.4  INR 1.00    Urinalysis: No results for input(s): COLORURINE, LABSPEC, PHURINE, GLUCOSEU, HGBUR, BILIRUBINUR, KETONESUR, PROTEINUR, UROBILINOGEN, NITRITE, LEUKOCYTESUR in the last 72 hours.  Invalid input(s): APPERANCEUR    Imaging: No results found.   Medications:   . heparin 12 Units/kg/hr (11/20/15 1821)   . allopurinol  100 mg Oral Daily  . aspirin EC  81 mg Oral Daily  . calcium acetate (Phos Binder)  1,334 mg Oral TID WC  . clopidogrel  75 mg Oral Daily  . docusate sodium  100 mg Oral BID  . famotidine (PEPCID) IV  20 mg Intravenous Daily  . finasteride  5 mg Oral Daily  . fluticasone  2 spray Each Nare Daily  . furosemide  40 mg Oral Daily  . gabapentin  100 mg Oral QHS  . insulin aspart  0-9 Units Subcutaneous TID WC  . insulin glargine  10 Units Subcutaneous QHS  . isosorbide mononitrate  30 mg Oral Daily  . levothyroxine  100 mcg Oral QAC breakfast  . loratadine  10 mg Oral Daily  . pantoprazole  40 mg Oral Daily  . simvastatin  20 mg Oral q1800  . sodium chloride flush  3 mL Intravenous Q12H   sodium chloride, acetaminophen **OR** acetaminophen, bisacodyl, calcium acetate, hydroxypropyl methylcellulose / hypromellose, ipratropium-albuterol,  lidocaine-prilocaine, morphine injection, nitroGLYCERIN, ondansetron **OR** ondansetron (ZOFRAN) IV, sodium chloride flush  Assessment/ Plan:  82 y.o. male on hemodialysis Monday, Wednesday, Friday followed by [facility] nephrology, anemia of chronic kidney disease, secondary hyperparathyroid is him, hypertension, hyperlipidemia, diabetes mellitus type 2, hypothyroidism, coronary artery disease, sleep apnea, GERD admitted for chest pain.  [facility] nephrology/MWF/[address]   1. End-stage renal disease on dialysis MWF.  Continue dialysis on MWF schedule. - Patient scheduled for hemodialysis today. Orders have been prepared.  2. Anemia of chronic kidney disease. Hemoglobin 12.6 at moment. No indication for Procrit.  3. Secondary hyperparathyroidism. Monitor phos Continue calcium acetate.  4. Chest pain: Troponin up to 7.07. Further input per cardiology.   LOS: 1 Damaris Geers 7/24/20179:46 AM

## 2015-11-21 NOTE — Progress Notes (Signed)
ANTICOAGULATION CONSULT NOTE - Initial Consult  Pharmacy Consult for heparin drip Indication: chest pain/ACS  Allergies  Allergen Reactions  . Cinacalcet Other (See Comments)    Reaction: unknown  . Lisinopril Cough    Patient Measurements: Height: 5\' 6"  (167.6 cm) Weight: 203 lb 4.2 oz (92.2 kg) IBW/kg (Calculated) : 63.8 Heparin Dosing Weight: 80kg  Vital Signs: Temp: 98.9 F (37.2 C) (07/24 1908) Temp Source: Oral (07/24 1908) BP: 116/86 (07/24 1918) Pulse Rate: 100 (07/24 1918)  Labs:  Recent Labs  11/20/15 0906  11/20/15 1434 11/20/15 1849 11/20/15 2250 11/21/15 0047 11/21/15 1600  HGB 12.6*  --   --   --   --   --  12.7*  HCT 36.3*  --   --   --   --   --  38.3*  PLT 150  --   --   --   --   --  173  APTT  --   --  27  --   --   --   --   LABPROT  --   --  13.4  --   --   --   --   INR  --   --  1.00  --   --   --   --   HEPARINUNFRC  --   --   --   --  0.42  --  0.25*  CREATININE 6.73*  --   --   --   --   --  5.79*  TROPONINI 0.07*  < >  --  3.11*  --  7.07* 6.56*  < > = values in this interval not displayed.  Estimated Creatinine Clearance: 8.8 mL/min (by C-G formula based on SCr of 5.79 mg/dL).   Medical History: Past Medical History:  Diagnosis Date  . Arthritis   . Bronchitis   . Chronic kidney disease   . Coronary artery disease   . Diabetes mellitus without complication (HCC)   . GERD (gastroesophageal reflux disease)   . Gout   . Hypercholesterolemia   . Hyperlipidemia   . Hypertension   . Hypothyroidism   . Obesity   . Shingles   . Sleep apnea    Assessment: 82 yo male with chest pain and elevated troponin. Pharmacy consulted for heparin drip for ACS/Chest pain    Goal of Therapy:  Heparin level 0.3-0.7 units/ml Monitor platelets by anticoagulation protocol: Yes   Plan:   Heparin level resulted @ 0.25, which is subtherapeutic. Will give heparin bolus of 1200 units x 1 and will increase heparin gtt @ 1350 units/hr. Will  recheck heparin gtt 8 hours after the initiation of new rate. Previously patient was a hard stick and had to have HL drawn with dialysis.   Demetrius Charity, PharmD  Clinical Pharmacist 11/21/2015 8:52 PM

## 2015-11-21 NOTE — Progress Notes (Signed)
End of hd tx  

## 2015-11-21 NOTE — Progress Notes (Signed)
Post hd assessment 

## 2015-11-21 NOTE — Consult Note (Signed)
[facility] CARDIOLOGY [facility]  CARDIOLOGY CONSULT NOTE  Patient ID: Johnathan WULF Sr. MRN: [id number] DOB/AGE: 1923-04-06 82 y.o.  Admit date: 11/20/2015 Referring Physician Dr. Juliene Pina Primary Physician   Primary Cardiologist Dr. Welton Flakes Reason for Consultation nstemi  HPI: Pt is a 82 yo male with esrd on hd who was admitted yesterday via er with complaints of chest pain. Urgent consult called to me this morning by floor regarding further treatment. Pt sates he has mid sternal chest pain. It worsens with deep breathing and deep palpation. His initial serum troponin was unremarkable but has risen to 7.07. IV nitroglycerin has improved pain somewhat but not completely. He has relative hypotension. EKG reveals sinus tach with rbbb. No obvious ischemic changes. He is currently on asa and clopidogrel, iv ntg. He is on simvastatin as an outpatient. Pt has deferred morphine at present due to concern over it making hi sleepy. He was admitted earlier this month with similar complaints and was dicharged with outpatient follow up for funcitonal study. He has a medtronic adapta adsr01 with a single lead placed in 08/17/14, set at back up rate of 60. This was placed due to type 2 heart block which was symptomatic.   Review of Systems  HENT: Negative.   Eyes: Negative.   Respiratory: Positive for shortness of breath.   Cardiovascular: Positive for chest pain.  Gastrointestinal: Negative.   Genitourinary: Negative.   Musculoskeletal: Negative.   Skin: Negative.   Neurological: Positive for weakness.  Endo/Heme/Allergies: Negative.   Psychiatric/Behavioral: Negative.     Past Medical History:  Diagnosis Date  . Arthritis   . Bronchitis   . Chronic kidney disease   . Coronary artery disease   . Diabetes mellitus without complication (HCC)   . GERD (gastroesophageal reflux disease)   . Gout   . Hypercholesterolemia   . Hyperlipidemia   . Hypertension   . Hypothyroidism   . Obesity    . Shingles   . Sleep apnea     Family History  Problem Relation Age of Onset  . Diabetes Mellitus II Father     Social History   Social History  . Marital status: Married    Spouse name: N/A  . Number of children: N/A  . Years of education: N/A   Occupational History  . Not on file.   Social History Main Topics  . Smoking status: Former Smoker    Packs/day: 2.00    Years: 15.00  . Smokeless tobacco: Never Used  . Alcohol use No  . Drug use: No  . Sexual activity: Not on file   Other Topics Concern  . Not on file   Social History Narrative  . No narrative on file    Past Surgical History:  Procedure Laterality Date  . CHOLECYSTECTOMY    . EYE SURGERY    . HERNIA REPAIR    . PERIPHERAL VASCULAR CATHETERIZATION Left 01/25/2015   Procedure: A/V Shuntogram/Fistulagram;  Surgeon: Renford Dills, MD;  Location: [facility] INVASIVE CV LAB;  Service: Cardiovascular;  Laterality: Left;  . PERIPHERAL VASCULAR CATHETERIZATION N/A 01/25/2015   Procedure: A/V Shunt Intervention;  Surgeon: Renford Dills, MD;  Location: [facility] INVASIVE CV LAB;  Service: Cardiovascular;  Laterality: N/A;     Prescriptions Prior to Admission  Medication Sig Dispense Refill Last Dose  . acetaminophen (TYLENOL) 325 MG tablet Take 650 mg by mouth every 8 (eight) hours as needed for mild pain, moderate pain or fever.  11/20/2015 at Unknown time  . albuterol-ipratropium (COMBIVENT) 18-103 MCG/ACT inhaler Inhale 2 puffs into the lungs every 4 (four) hours as needed for wheezing or shortness of breath.   Past Week at Unknown time  . allopurinol (ZYLOPRIM) 100 MG tablet Take 100 mg by mouth daily.   11/20/2015 at Unknown time  . aspirin 81 MG chewable tablet Chew 81 mg by mouth daily.   11/20/2015 at Unknown time  . atorvastatin (LIPITOR) 80 MG tablet Take 80 mg by mouth at bedtime.   11/19/2015 at Unknown time  . b complex-vitamin c-folic acid (NEPHRO-VITE) 0.8 MG TABS tablet Take 1 tablet by mouth daily.    11/20/2015 at Unknown time  . calcium acetate (PHOSLO) 667 MG capsule Take 1,334 mg by mouth See admin instructions. Take 2 capsules (1334 mg) by mouth three times a day after meals, and take 2 capsules (1334 mg) by mouth with snacks.   11/20/2015 at Unknown time  . clopidogrel (PLAVIX) 75 MG tablet Take 1 tablet (75 mg total) by mouth daily. 30 tablet 0 11/20/2015 at 0600  . docusate sodium (COLACE) 100 MG capsule Take 100 mg by mouth every other day.   Past Week at Unknown time  . finasteride (PROSCAR) 5 MG tablet Take 5 mg by mouth at bedtime.    11/19/2015 at Unknown time  . flunisolide (NASAREL) 29 MCG/ACT (0.025%) nasal spray Place 2 sprays into the nose 2 (two) times daily. Dose is for each nostril.   11/20/2015 at Unknown time  . furosemide (LASIX) 40 MG tablet Take 40 mg by mouth daily.   11/20/2015 at Unknown time  . gabapentin (NEURONTIN) 100 MG capsule Take 100 mg by mouth at bedtime.    11/19/2015 at Unknown time  . glipiZIDE (GLUCOTROL) 5 MG tablet Take by mouth daily before breakfast.   11/20/2015 at Unknown time  . hydroxypropyl methylcellulose / hypromellose (ISOPTO TEARS / GONIOVISC) 2.5 % ophthalmic solution Place 1 drop into both eyes 2 (two) times daily.    11/20/2015 at Unknown time  . insulin glargine (LANTUS) 100 UNIT/ML injection Inject 10 Units into the skin at bedtime.    11/19/2015 at Unknown time  . isosorbide mononitrate (IMDUR) 30 MG 24 hr tablet Take 1 tablet (30 mg total) by mouth daily. 30 tablet 0 unknown at Unknown time  . LACTOBACILLUS PO Take 2 tablets by mouth 2 (two) times daily before a meal.    11/20/2015 at Unknown time  . levothyroxine (SYNTHROID, LEVOTHROID) 50 MCG tablet Take 50 mcg by mouth daily before breakfast.   11/20/2015 at Unknown time  . lidocaine-prilocaine (EMLA) cream Apply to dialysis access before treatment Monday, Wednesday, Friday   11/18/2015  . loratadine (CLARITIN) 10 MG tablet Take 10 mg by mouth daily.   11/20/2015 at Unknown time  . omega-3 acid  ethyl esters (LOVAZA) 1 g capsule Take 1 g by mouth 2 (two) times daily.   11/20/2015 at Unknown time  . omeprazole (PRILOSEC) 40 MG capsule Take 1 capsule (40 mg total) by mouth 2 (two) times daily. 60 capsule 0 11/20/2015 at Unknown time    Physical Exam: Blood pressure (!) 96/53, pulse 100, temperature 97.4 F (36.3 C), temperature source Axillary, resp. rate 15, height  (1.676 m), weight 89.4 kg (197 lb 1.5 oz), SpO2 95 %.   Wt Readings from Last 1 Encounters:  11/20/15 89.4 kg (197 lb 1.5 oz)     General appearance: cooperative Head: Normocephalic, without obvious abnormality, atraumatic Resp: clear to  auscultation bilaterally Cardio: regular rate and rhythm GI: soft, non-tender; bowel sounds normal; no masses,  no organomegaly Extremities: edema 2+ lower extremety edema bilaterally Neurologic: Grossly normal  Labs:   Lab Results  Component Value Date   WBC 9.5 11/20/2015   HGB 12.6 (L) 11/20/2015   HCT 36.3 (L) 11/20/2015   MCV 104.4 (H) 11/20/2015   PLT 150 11/20/2015    Recent Labs Lab 11/20/15 0906  NA 137  K 3.6  CL 96*  CO2 28  BUN 30*  CREATININE 6.73*  CALCIUM 10.1  GLUCOSE 260*   Lab Results  Component Value Date   CKTOTAL 23 (L) 03/25/2014   CKMB 1.1 03/26/2014   TROPONINI 7.07 (HH) 11/21/2015      Radiology: Mild bibasilar atelectasis with mild cardiomegaly.  EKG: nsr with rbbb. No ischemia  ASSESSMENT AND PLAN:  82 yo male with esrd on hd, history of apparent second degree hb with ppm, admitted with mid sternal chest pain. He has ruled in for a nstemi with serum troponin of greater than 7 so far. EKG shows no actue findings. Will attempt to contral medically with dual antiplatelet therapy, nitrates , beta blockers and statin. Fairly high risk for cath due to comorbid conditions and age. Echo pending to evaluate lv and valves. WIll continue with hd and titrate iv ntg as hemodynaics tolerate.  Further recs pending course.  Signed: Dalia Heading MD, [facility] 11/21/2015, 1:00 PM

## 2015-11-21 NOTE — Progress Notes (Signed)
Called MD to notify that pt troponin up to 7.07. Pt on heparin gtt, for cardiac cath in AM and dialysis in the afternoon.

## 2015-11-21 NOTE — Progress Notes (Signed)
Report Given to [facility]. Good day. Chest pain x 1 relieved by NTG x 2. Discussed code status. Patient stated he wanted to remain a full code. Stated he had received CPR before and it was difficult to recover from but he wanted CPR and everything done again. Complained of left foot pain but reminded by daughter that he had previously broken that foot and that foot hurt frequently.

## 2015-11-21 NOTE — Progress Notes (Signed)
*  PRELIMINARY RESULTS* Echocardiogram 2D Echocardiogram has been performed.  [name] 11/21/2015, 2:35 PM 

## 2015-11-22 ENCOUNTER — Inpatient Hospital Stay: Admit: 2015-11-22 | Payer: Medicare Other

## 2015-11-22 DIAGNOSIS — I214 Non-ST elevation (NSTEMI) myocardial infarction: Principal | ICD-10-CM

## 2015-11-22 DIAGNOSIS — Z992 Dependence on renal dialysis: Secondary | ICD-10-CM

## 2015-11-22 DIAGNOSIS — R000 Tachycardia, unspecified: Secondary | ICD-10-CM

## 2015-11-22 DIAGNOSIS — R0789 Other chest pain: Secondary | ICD-10-CM

## 2015-11-22 DIAGNOSIS — N186 End stage renal disease: Secondary | ICD-10-CM

## 2015-11-22 DIAGNOSIS — I959 Hypotension, unspecified: Secondary | ICD-10-CM

## 2015-11-22 LAB — GLUCOSE, CAPILLARY
GLUCOSE-CAPILLARY: 124 mg/dL — AB (ref 65–99)
GLUCOSE-CAPILLARY: 180 mg/dL — AB (ref 65–99)
GLUCOSE-CAPILLARY: 146 mg/dL — AB (ref 65–99)
Glucose-Capillary: 159 mg/dL — ABNORMAL HIGH (ref 65–99)

## 2015-11-22 LAB — TROPONIN I: TROPONIN I: 2.36 ng/mL — AB (ref <0.03)

## 2015-11-22 LAB — HEPARIN LEVEL (UNFRACTIONATED)
Heparin Unfractionated: 0.76 IU/mL — ABNORMAL HIGH (ref 0.30–0.70)
Heparin Unfractionated: 0.57 IU/mL (ref 0.30–0.70)

## 2015-11-22 LAB — CBC
HCT: 38.2 % — ABNORMAL LOW (ref 40.0–52.0)
Hemoglobin: 13.4 g/dL (ref 13.0–18.0)
MCH: 36.7 pg — AB (ref 26.0–34.0)
MCHC: 35.1 g/dL (ref 32.0–36.0)
MCV: 104.6 fL — AB (ref 80.0–100.0)
PLATELETS: 153 10*3/uL (ref 150–440)
RBC: 3.65 MIL/uL — ABNORMAL LOW (ref 4.40–5.90)
RDW: 14.7 % — AB (ref 11.5–14.5)
WBC: 10.8 10*3/uL — ABNORMAL HIGH (ref 3.8–10.6)

## 2015-11-22 MED ORDER — DILTIAZEM HCL 30 MG PO TABS
30.0000 mg | ORAL_TABLET | Freq: Three times a day (TID) | ORAL | Status: DC
  Administered 2015-11-22: 30 mg via ORAL
  Filled 2015-11-22: qty 1

## 2015-11-22 MED ORDER — SODIUM CHLORIDE 0.9 % IV BOLUS (SEPSIS)
250.0000 mL | Freq: Once | INTRAVENOUS | Status: AC
  Administered 2015-11-22: 250 mL via INTRAVENOUS

## 2015-11-22 MED ORDER — AMIODARONE LOAD VIA INFUSION
75.0000 mg | Freq: Once | INTRAVENOUS | Status: AC
  Administered 2015-11-22: 75 mg via INTRAVENOUS
  Filled 2015-11-22: qty 41.67

## 2015-11-22 MED ORDER — SODIUM CHLORIDE 0.9 % IV BOLUS (SEPSIS)
500.0000 mL | Freq: Once | INTRAVENOUS | Status: AC
  Administered 2015-11-22: 500 mL via INTRAVENOUS

## 2015-11-22 MED ORDER — NOREPINEPHRINE 4 MG/250ML-% IV SOLN
INTRAVENOUS | Status: AC
  Administered 2015-11-22: 4 mg
  Filled 2015-11-22: qty 250

## 2015-11-22 MED ORDER — AMIODARONE HCL IN DEXTROSE 360-4.14 MG/200ML-% IV SOLN
30.0000 mg/h | INTRAVENOUS | Status: DC
  Administered 2015-11-23 – 2015-11-24 (×4): 30 mg/h via INTRAVENOUS
  Filled 2015-11-22 (×13): qty 200

## 2015-11-22 MED ORDER — PHENYLEPHRINE HCL 10 MG/ML IJ SOLN
0.0000 ug/min | INTRAVENOUS | Status: DC
  Filled 2015-11-22: qty 1

## 2015-11-22 MED ORDER — NOREPINEPHRINE BITARTRATE 1 MG/ML IV SOLN
0.0000 ug/min | INTRAVENOUS | Status: DC
  Administered 2015-11-23: 6 ug/min via INTRAVENOUS
  Administered 2015-11-25: 20 ug/min via INTRAVENOUS
  Administered 2015-11-25: 32 ug/min via INTRAVENOUS
  Filled 2015-11-22 (×6): qty 16

## 2015-11-22 MED ORDER — AMIODARONE HCL IN DEXTROSE 360-4.14 MG/200ML-% IV SOLN
60.0000 mg/h | INTRAVENOUS | Status: AC
  Filled 2015-11-22: qty 200

## 2015-11-22 NOTE — Progress Notes (Signed)
Patient appears to have become confused and has pulled out recently placed right IJ PICC line. PICC line is intact, no evidence of bleeding or hematoma noted. Patient requiring reorientation. [facility] paged to notify.

## 2015-11-22 NOTE — Progress Notes (Signed)
Patient BP still low, currently SBP 70's and HR in 120's. Dr. Lady Gary aware, has ordered for patient to receive 2 more NS boluses. Dr. Lady Gary notified that patient rhythm appears to be afib. Amio gtt ordered. Currently patient has heparin gtt infusing through peripheral IV, which is not compatible with amio. Attempting now to est another peripheral.  Trudee Kuster

## 2015-11-22 NOTE — Progress Notes (Signed)
ANTICOAGULATION CONSULT NOTE - Initial Consult  Pharmacy Consult for heparin drip Indication: chest pain/ACS  Allergies  Allergen Reactions  . Cinacalcet Other (See Comments)    Reaction: unknown  . Lisinopril Cough    Patient Measurements: Height: 5\' 6"  (167.6 cm) Weight: 195 lb 8.8 oz (88.7 kg) IBW/kg (Calculated) : 63.8 Heparin Dosing Weight: 80kg  Vital Signs: Temp: 98.3 F (36.8 C) (07/25 0735) Temp Source: Oral (07/25 0735) BP: 123/106 (07/25 0600) Pulse Rate: 100 (07/25 0600)  Labs:  Recent Labs  11/20/15 0906  11/20/15 1434 11/20/15 1849 11/20/15 2250 11/21/15 0047 11/21/15 1600 11/22/15 0550  HGB 12.6*  --   --   --   --   --  12.7* 13.4  HCT 36.3*  --   --   --   --   --  38.3* 38.2*  PLT 150  --   --   --   --   --  173 153  APTT  --   --  27  --   --   --   --   --   LABPROT  --   --  13.4  --   --   --   --   --   INR  --   --  1.00  --   --   --   --   --   HEPARINUNFRC  --   --   --   --  0.42  --  0.25* 0.76*  CREATININE 6.73*  --   --   --   --   --  5.79*  --   TROPONINI 0.07*  < >  --  3.11*  --  7.07* 6.56*  --   < > = values in this interval not displayed.  Estimated Creatinine Clearance: 8.7 mL/min (by C-G formula based on SCr of 5.79 mg/dL).   Medical History: Past Medical History:  Diagnosis Date  . Arthritis   . Bronchitis   . Chronic kidney disease   . Coronary artery disease   . Diabetes mellitus without complication (HCC)   . GERD (gastroesophageal reflux disease)   . Gout   . Hypercholesterolemia   . Hyperlipidemia   . Hypertension   . Hypothyroidism   . Obesity   . Shingles   . Sleep apnea    Assessment: 82 yo male with chest pain and elevated troponin. Pharmacy consulted for heparin drip for ACS/Chest pain    Goal of Therapy:  Heparin level 0.3-0.7 units/ml Monitor platelets by anticoagulation protocol: Yes   Plan:  Heparin level= 0.76. Will decrease heparin infusion to 1250 units/hr and recheck a HL in 8  hours.    Luisa Hart, PharmD Clinical Pharmacist  11/22/2015 8:45 AM

## 2015-11-22 NOTE — Progress Notes (Signed)
Spoke with Dr. Juliene Pina about possibility of PICC/Cerntral line, and stated that it would be okay if needed. Trudee Kuster

## 2015-11-22 NOTE — Progress Notes (Signed)
BP and HR relatively unchanged, Dr. Lady Gary notified and diltiazem ordered. Trudee Kuster

## 2015-11-22 NOTE — Progress Notes (Signed)
ANTICOAGULATION CONSULT NOTE - Initial Consult  Pharmacy Consult for heparin drip Indication: chest pain/ACS  Allergies  Allergen Reactions  . Cinacalcet Other (See Comments)    Reaction: unknown  . Lisinopril Cough    Patient Measurements: Height: 5\' 6"  (167.6 cm) Weight: 195 lb 8.8 oz (88.7 kg) IBW/kg (Calculated) : 63.8 Heparin Dosing Weight: 80kg  Vital Signs: Temp: 98.4 F (36.9 C) (07/25 1400) Temp Source: Oral (07/25 1400) BP: 93/65 (07/25 1521) Pulse Rate: 128 (07/25 1400)  Labs:  Recent Labs  11/20/15 0906  11/20/15 1434 11/20/15 1849  11/21/15 0047 11/21/15 1600 11/22/15 0550 11/22/15 1550  HGB 12.6*  --   --   --   --   --  12.7* 13.4  --   HCT 36.3*  --   --   --   --   --  38.3* 38.2*  --   PLT 150  --   --   --   --   --  173 153  --   APTT  --   --  27  --   --   --   --   --   --   LABPROT  --   --  13.4  --   --   --   --   --   --   INR  --   --  1.00  --   --   --   --   --   --   HEPARINUNFRC  --   --   --   --   < >  --  0.25* 0.76* 0.57  CREATININE 6.73*  --   --   --   --   --  5.79*  --   --   TROPONINI 0.07*  < >  --  3.11*  --  7.07* 6.56*  --   --   < > = values in this interval not displayed.  Estimated Creatinine Clearance: 8.7 mL/min (by C-G formula based on SCr of 5.79 mg/dL).   Medical History: Past Medical History:  Diagnosis Date  . Arthritis   . Bronchitis   . Chronic kidney disease   . Coronary artery disease   . Diabetes mellitus without complication (HCC)   . GERD (gastroesophageal reflux disease)   . Gout   . Hypercholesterolemia   . Hyperlipidemia   . Hypertension   . Hypothyroidism   . Obesity   . Shingles   . Sleep apnea    Assessment: 82 yo male with chest pain and elevated troponin. Pharmacy consulted for heparin drip for ACS/Chest pain    Goal of Therapy:  Heparin level 0.3-0.7 units/ml Monitor platelets by anticoagulation protocol: Yes   Plan:  Heparin level= 0.57. Will continue heparin infusion  at 1250 units/hr and check a confirmatory level in 8 hours.   Luisa Hart, PharmD Clinical Pharmacist  11/22/2015 4:16 PM

## 2015-11-22 NOTE — Progress Notes (Signed)
Called to bedside by RN to evaluated patient with hypotension and tachyarrythmia requiring a CVL. HR in the 130s, Dr. Lady Gary has seen an ordered Aminodarone gtt. BP initially in the 70s and 60s, after a total of 1L fluid resuscitation. Patient evaluated for CVL due to loss of IVs, and need hemodynamic support with other meds. Given his tachyarrhythmia (possibly afib vs SVT) along with NSTEMI inserting an IJ CVL could exasperate his tachyrhythmia ensuing a cardiac event, therefore femoral CVL pursued.   Discussion with family and patient about goals of care and current clinical status.  Family and patient has stated that he would not want extraneous life prolonging measures in the event he was to become unconsciousness or have another cardiac event (especially with NSTEMI, ESRD, 2nd degree heart block with ppm, moderate to severe AS).  Family and patient stated wishes for DNR/DNI. Orderes entered  RN witness - Verdie Drown, MD [facility] Pager [phone] (please enter 7-digits) On Call Pager - [phone] (please enter 7-digits)

## 2015-11-22 NOTE — Progress Notes (Signed)
[facility]  SUBJECTIVE: asymptomatic . Heart rate increased. Relative hypotension   Vitals:   11/22/15 2000 11/22/15 2015 11/22/15 2030 11/22/15 2100  BP:  (!) 68/54 (!) 78/49 (!) 73/52  Pulse:  (!) 114 (!) 122 (!) 119  Resp:  18 (!) 21 17  Temp: 97.3 F (36.3 C)     TempSrc: Axillary     SpO2:  95% 95% 95%  Weight:      Height:        Intake/Output Summary (Last 24 hours) at 11/22/15 2228 Last data filed at 11/22/15 2000  Gross per 24 hour  Intake             1134 ml  Output                0 ml  Net             1134 ml    LABS: Basic Metabolic Panel:  Recent Labs  45/40/98 0906 11/21/15 1600  NA 137 133*  K 3.6 3.9  CL 96* 93*  CO2 28 25  GLUCOSE 260* 146*  BUN 30* 27*  CREATININE 6.73* 5.79*  CALCIUM 10.1 9.2  PHOS  --  2.4*   Liver Function Tests:  Recent Labs  11/21/15 1600  AST 44*  ALT 14*  ALKPHOS 48  BILITOT 0.5  PROT 6.0*  ALBUMIN 2.9*   No results for input(s): LIPASE, AMYLASE in the last 72 hours. CBC:  Recent Labs  11/21/15 1600 11/22/15 0550  WBC 13.3* 10.8*  HGB 12.7* 13.4  HCT 38.3* 38.2*  MCV 103.9* 104.6*  PLT 173 153   Cardiac Enzymes:  Recent Labs  11/21/15 0047 11/21/15 1600 11/22/15 1932  TROPONINI 7.07* 6.56* 2.36*   BNP: Invalid input(s): POCBNP D-Dimer: No results for input(s): DDIMER in the last 72 hours. Hemoglobin A1C: No results for input(s): HGBA1C in the last 72 hours. Fasting Lipid Panel: No results for input(s): CHOL, HDL, LDLCALC, TRIG, CHOLHDL, LDLDIRECT in the last 72 hours. Thyroid Function Tests: No results for input(s): TSH, T4TOTAL, T3FREE, THYROIDAB in the last 72 hours.  Invalid input(s): FREET3 Anemia Panel: No results for input(s): VITAMINB12, FOLATE, FERRITIN, TIBC, IRON, RETICCTPCT in the last 72 hours.   Physical Exam: Blood pressure (!) 73/52, pulse (!) 119, temperature 97.3 F (36.3 C), temperature source Axillary, resp. rate 17, height  5\' 6"  (1.676 m), weight 88.7 kg (195 lb 8.8 oz), SpO2 95 %.   Wt Readings from Last 1 Encounters:  11/22/15 88.7 kg (195 lb 8.8 oz)     General appearance: alert and cooperative Resp: clear to auscultation bilaterally Cardio: tachycardic GI: soft, non-tender; bowel sounds normal; no masses,  no organomegaly Extremities: edema 2+ Neurologic: Grossly normal  TELEMETRY: Reviewed telemetry pt in tachycardia. Appears to be afib with rvr.:  ASSESSMENT AND PLAN:  Principal Problem:   NSTEMI (non-ST elevated myocardial infarction) (HCC)-ruled in to nstemi. Not ideal candidate for cath. Wil attempt to medically manage. Will need to stop nitrates due to hypotension. Not candidate for beta blocker at pressent due to hypotension Active Problems:   Atypical chest pain   ESRD on hemodialysis (HCC)   HTN, goal below 140/80   Tachyarrhythmia-likely afib with rvr. Will hive half load of amiodaarone and place on drip.    Arterial hypotension-hypotension is likely multifactoral .. Appears to ave afib with rvr which is exacerbating the hypotension. Hold any anti hypertensive meds and follw. Have gfiven bolus of 750 cc.  Dalia Heading., MD, [facility] 11/22/2015 10:28 PM

## 2015-11-22 NOTE — Progress Notes (Signed)
[facility] [place] [facility]   PATIENT NAME: [name]    MR#:  [id number]  DATE OF BIRTH:  02-Jul-1922  SUBJECTIVE:   Patient confused this am asking to speak with his mother  REVIEW OF SYSTEMS:    Review of Systems  Unable to perform ROS: Dementia    Tolerating Diet: yes      DRUG ALLERGIES:   Allergies  Allergen Reactions  . Cinacalcet Other (See Comments)    Reaction: unknown  . Lisinopril Cough    VITALS:  Blood pressure 104/63, pulse (!) 107, temperature 98.3 F (36.8 C), temperature source Oral, resp. rate 14, height  (1.676 m), weight 88.7 kg (195 lb 8.8 oz), SpO2 97 %.  PHYSICAL EXAMINATION:   Physical Exam  Constitutional: He is well-developed, well-nourished, and in no distress. No distress.  HENT:  Head: Normocephalic.  Eyes: No scleral icterus.  Neck: Normal range of motion. Neck supple. No JVD present. No tracheal deviation present.  Cardiovascular: Normal rate and regular rhythm.  Exam reveals no gallop and no friction rub.   Murmur heard. Pulmonary/Chest: Effort normal and breath sounds normal. No respiratory distress. He has no wheezes. He has no rales. He exhibits no tenderness.  Abdominal: Soft. Bowel sounds are normal. He exhibits no distension and no mass. There is no tenderness. There is no rebound and no guarding.  Musculoskeletal: Normal range of motion. He exhibits no edema.  Neurological: He is alert.  Skin: Skin is warm. No rash noted. No erythema.      LABORATORY PANEL:   CBC  Recent Labs Lab 11/22/15 0550  WBC 10.8*  HGB 13.4  HCT 38.2*  PLT 153   ------------------------------------------------------------------------------------------------------------------  Chemistries   Recent Labs Lab 11/21/15 1600  NA 133*  K 3.9  CL 93*  CO2 25  GLUCOSE 146*  BUN 27*  CREATININE 5.79*  CALCIUM 9.2  AST 44*  ALT 14*  ALKPHOS 48  BILITOT 0.5    ------------------------------------------------------------------------------------------------------------------  Cardiac Enzymes  Recent Labs Lab 11/20/15 1849 11/21/15 0047 11/21/15 1600  TROPONINI 3.11* 7.07* 6.56*   ------------------------------------------------------------------------------------------------------------------  RADIOLOGY:  Dg Chest Port 1 View  Result Date: 11/21/2015 CLINICAL DATA:  Chest pain. EXAM: PORTABLE CHEST 1 VIEW COMPARISON:  11/02/2015 FINDINGS: Single lead cardiac pacemaker is stable. There has been an interval placement of right internal jugular approach central venous catheter with tip within the expected location of distal superior vena cava. The cardiac silhouette is enlarged. Mediastinal contours appear intact. Atherosclerotic disease and tortuosity of the aorta are noted. There is no evidence of focal airspace consolidation, pleural effusion or pneumothorax. Mild pulmonary vascular congestion. Osseous structures are without acute abnormality. Soft tissues are grossly normal. IMPRESSION: Enlarged cardiac silhouette. Atherosclerotic disease of the aorta. Mild pulmonary vascular congestion. Electronically Signed   By: Ted Mcalpine M.D.   On: 11/21/2015 20:38    ASSESSMENT AND PLAN:   82 year old male with ESRD on hemodialysis, anemia of chronic disease, diabetes and CAD who presented with chest pain and found to have non-STEMI.   1. Non-ST elevation MI: Troponin max is 7.07, Now trending down. Continue heparin drip likely may be discontinued tomorrow. Continue aspirin, Plavix, isosorbide, low-dose Coreg and statin Echo shows EF 50-55% with hypokinesis of apical myocardium and moderate to severe aortic valve stenosis. Patient not a candidate for cardiac catheterization, fairly high risk as per cardiology. Continue aggressive medical management.. Continue nitroglycerin/morphine for pain.  2. ESRD and hemodialysis: Continue hemodialysis  Monday, Wednesday  and Friday.  3. Diabetes: Continue sliding scale insulin and Lantus. A1c reported at 7.4 4. BPH: Continue finasteride. 5. Aortic valve stenosis: Patient not candidate for valve replacement. 6. Hypothyroidism: Continue Synthroid.  7. Delirium: This is related to ICU delirium as well as current medical issues. Continue to reorient patient. Sitter if needed for safety.     CODE STATUS: full  TOTAL TIME TAKING CARE OF THIS PATIENT: .     POSSIBLE D/C 2-3 days, DEPENDING ON CLINICAL CONDITION.   Tessla Spurling M.D on 11/22/2015 at 10:21 AM  Between 7am to 6pm - Pager - [phone] After 6pm go to [email or web address] - password [email or web address]  [facility]  Office  [phone]  CC: Primary care physician; Marisue Ivan, MD  Note: This dictation was prepared with Dragon dictation along with smaller phrase technology. Any transcriptional errors that result from this process are unintentional.

## 2015-11-22 NOTE — Procedures (Signed)
  Procedure Note: Right Femoral Central Venous Catheter Placement  Johnathan OYOLA Sr. , [id number] , [id number]/[id number]-[id number]  Indications: Hemodynamic monitoring / Intravenous access  Benefits, risks (including bleeding, infection,  Injury, etc.), and alternatives explained to patient who voiced understanding.  Questions were sought and answered.   Patient agreed to proceed with the procedure.  Consent is signed and on chart. A time-out was completed verifying correct patient, procedure and site.  A 3 lumen catheter available at the time of procedure.  The patient was placed in a dependent position appropriate for central line placement based on the vein to be cannulated.   The patient's RIGHT Femoral Vein was prepped and draped in a sterile fashion.  1% Lidocaine WAS used to anesthetize the surrounding skin area.   A3 lumen catheter was introduced into the RIGHT Femoral Vein using Seldinger technique, visualized under ultrasound.  The catheter was threaded smoothly over the guide wire and appropriate blood return was obtained.  Each lumen of the catheter was evacuated of air and flushed with sterile saline.  The catheter was then sutured in place to the skin and a sterile dressing applied.  Perfusion to the extremity distal to the point of catheter insertion was checked and found to be adequate.     The patient tolerated the procedure well and there were no complications.  Stephanie Acre, MD [facility] Pager [phone] (please enter 7-digits) On Call Pager - [phone] (please enter 7-digits)

## 2015-11-22 NOTE — Progress Notes (Signed)
Patient HR reading in 130's and SBP 80's. Dr. Lady Gary paged and received order for bolus. Bolus started, will cont to assess. [name]

## 2015-11-22 NOTE — Consult Note (Signed)
PULMONARY / CRITICAL CARE MEDICINE   Name: Johnathan WADDINGTON Sr. MRN: [id number] DOB: 08-18-1922    ADMISSION DATE:  11/20/2015 CONSULTATION DATE:11/22/15  REFERRING MD: Camillia Herter  CHIEF COMPLAINT:  Hypotension, Tachyarrthmia  HISTORY OF PRESENT ILLNESS:   [name] is a 82 year old male with past medical history significant for bronchitis, chronic kidney disease, coronary artery disease, MI, diabetes mellitus, GERD, hyperlipidemia, hypertension, sleep apnea. Patient was admitted on 7/23 with chest pain, troponins where 3.11. Patient was initiated on heparin drip. On following day,7/24 troponin was noted to be 7.07. Patient was getting medically managed as he was a poor candidate for cardiac catheterization. Patient also noted to have end-stage renal disease and was dialyzed on 7/24 and removed approximately 1.5liters of fluid.patient was apparently doing well up until this afternoon. Noted to have tachyrhythmias and hypotension. Patient received a total of 1 L fluid bolus but was persistently hypotensive . Therefore [name] was consulted for central line placement.    PAST MEDICAL HISTORY :  He  has a past medical history of Arthritis; Bronchitis; Chronic kidney disease; Coronary artery disease; Diabetes mellitus without complication (HCC); GERD (gastroesophageal reflux disease); Gout; Hypercholesterolemia; Hyperlipidemia; Hypertension; Hypothyroidism; Obesity; Shingles; and Sleep apnea.  PAST SURGICAL HISTORY: He  has a past surgical history that includes Cholecystectomy; Hernia repair; Eye surgery; Cardiac catheterization (Left, 01/25/2015); and Cardiac catheterization (N/A, 01/25/2015).  Allergies  Allergen Reactions  . Cinacalcet Other (See Comments)    Reaction: unknown  . Lisinopril Cough    No current facility-administered medications on file prior to encounter.    Current Outpatient Prescriptions on File Prior to Encounter  Medication Sig  . acetaminophen (TYLENOL) 325 MG tablet Take  650 mg by mouth every 8 (eight) hours as needed for mild pain, moderate pain or fever.   Marland Kitchen albuterol-ipratropium (COMBIVENT) 18-103 MCG/ACT inhaler Inhale 2 puffs into the lungs every 4 (four) hours as needed for wheezing or shortness of breath.  . allopurinol (ZYLOPRIM) 100 MG tablet Take 100 mg by mouth daily.  . calcium acetate (PHOSLO) 667 MG capsule Take 1,334 mg by mouth See admin instructions. Take 2 capsules (1334 mg) by mouth three times a day after meals, and take 2 capsules (1334 mg) by mouth with snacks.  . clopidogrel (PLAVIX) 75 MG tablet Take 1 tablet (75 mg total) by mouth daily.  Marland Kitchen docusate sodium (COLACE) 100 MG capsule Take 100 mg by mouth every other day.  . finasteride (PROSCAR) 5 MG tablet Take 5 mg by mouth at bedtime.   . flunisolide (NASAREL) 29 MCG/ACT (0.025%) nasal spray Place 2 sprays into the nose 2 (two) times daily. Dose is for each nostril.  . furosemide (LASIX) 40 MG tablet Take 40 mg by mouth daily.  Marland Kitchen gabapentin (NEURONTIN) 100 MG capsule Take 100 mg by mouth at bedtime.   Marland Kitchen glipiZIDE (GLUCOTROL) 5 MG tablet Take by mouth daily before breakfast.  . hydroxypropyl methylcellulose / hypromellose (ISOPTO TEARS / GONIOVISC) 2.5 % ophthalmic solution Place 1 drop into both eyes 2 (two) times daily.   . insulin glargine (LANTUS) 100 UNIT/ML injection Inject 10 Units into the skin at bedtime.   . isosorbide mononitrate (IMDUR) 30 MG 24 hr tablet Take 1 tablet (30 mg total) by mouth daily.  Marland Kitchen LACTOBACILLUS PO Take 2 tablets by mouth 2 (two) times daily before a meal.   . loratadine (CLARITIN) 10 MG tablet Take 10 mg by mouth daily.  Marland Kitchen omega-3 acid ethyl esters (LOVAZA) 1  g capsule Take 1 g by mouth 2 (two) times daily.  Marland Kitchen omeprazole (PRILOSEC) 40 MG capsule Take 1 capsule (40 mg total) by mouth 2 (two) times daily.    FAMILY HISTORY:  His indicated that the status of his father is unknown.    SOCIAL HISTORY: He  reports that he has quit smoking. He has a 30.00  pack-year smoking history. He has never used smokeless tobacco. He reports that he does not drink alcohol or use drugs.  REVIEW OF SYSTEMS:   ROS   SUBJECTIVE:  Patient states that he has been feeling ok and denies any chest pain at this time  VITAL SIGNS: BP (!) 95/54   Pulse (!) 125   Temp 97.6 F (36.4 C) (Oral)   Resp (!) 26   Ht  (1.676 m)   Wt 195 lb 8.8 oz (88.7 kg)   SpO2 93%   BMI 31.56 kg/m   HEMODYNAMICS:    VENTILATOR SETTINGS:    INTAKE / OUTPUT: I/O last 3 completed shifts: In: 1567.8 [P.O.:400; I.V.:617.8; Other:500; IV Piggyback:50] Out: 500 [Other:500]  PHYSICAL EXAMINATION: General:  Elderly male , found with tachycardia, in no acute distress Neuro: Awake, alert, oriented, follows command, no focal deficit HEENT: Atraumatic, normocephalic, no discharge Cardiovascular: ST, no MRG noted Lungs:  Clear, diminished towards bases, no wheezes, crackles, rhonchi noted. Abdomen:  Obese, round, active bowel sounds Musculoskeletal:  No inflammation /deformity noted Skin:  Grossly intact  LABS:  BMET  Recent Labs Lab 11/20/15 0906 11/21/15 1600  NA 137 133*  K 3.6 3.9  CL 96* 93*  CO2 28 25  BUN 30* 27*  CREATININE 6.73* 5.79*  GLUCOSE 260* 146*    Electrolytes  Recent Labs Lab 11/20/15 0906 11/21/15 1600  CALCIUM 10.1 9.2  PHOS  --  2.4*    CBC  Recent Labs Lab 11/20/15 0906 11/21/15 1600 11/22/15 0550  WBC 9.5 13.3* 10.8*  HGB 12.6* 12.7* 13.4  HCT 36.3* 38.3* 38.2*  PLT 150 173 153    Coag's  Recent Labs Lab 11/20/15 1434  APTT 27  INR 1.00    Sepsis Markers No results for input(s): LATICACIDVEN, PROCALCITON, O2SATVEN in the last 168 hours.  ABG No results for input(s): PHART, PCO2ART, PO2ART in the last 168 hours.  Liver Enzymes  Recent Labs Lab 11/21/15 1600  AST 44*  ALT 14*  ALKPHOS 48  BILITOT 0.5  ALBUMIN 2.9*    Cardiac Enzymes  Recent Labs Lab 11/20/15 1849 11/21/15 0047  11/21/15 1600  TROPONINI 3.11* 7.07* 6.56*    Glucose  Recent Labs Lab 11/21/15 1112 11/21/15 1556 11/21/15 2116 11/22/15 0713 11/22/15 1142 11/22/15 1725  GLUCAP 210* 161* 148* 124* 180* 146*    Imaging Dg Chest Port 1 View  Result Date: 11/21/2015 CLINICAL DATA:  Chest pain. EXAM: PORTABLE CHEST 1 VIEW COMPARISON:  11/02/2015 FINDINGS: Single lead cardiac pacemaker is stable. There has been an interval placement of right internal jugular approach central venous catheter with tip within the expected location of distal superior vena cava. The cardiac silhouette is enlarged. Mediastinal contours appear intact. Atherosclerotic disease and tortuosity of the aorta are noted. There is no evidence of focal airspace consolidation, pleural effusion or pneumothorax. Mild pulmonary vascular congestion. Osseous structures are without acute abnormality. Soft tissues are grossly normal. IMPRESSION: Enlarged cardiac silhouette. Atherosclerotic disease of the aorta. Mild pulmonary vascular congestion. Electronically Signed   By: Ted Mcalpine M.D.   On: 11/21/2015 20:38  STUDIES:  7/24 ECHO>>Aortic valve stenosis,EF of 50 -55%  CULTURES: none  ANTIBIOTICS: none  SIGNIFICANT EVENTS: 7/23 Patient admitted to ICU for NSTEMI>> LINES/TUBES:  11/22/15 right femoral central line>>  DISCUSSION: 82 year old male  With HX OFbronchitis, chronic kidney disease, coronary artery disease, MI, diabetes mellitus, GERD, hyperlipidemia, hypertension, sleep apnea, Now admitted with NSTEMI, and  hypotensive exhibiting  tachyrhythmia requiring amiodarone infusion and mini fluid bolus.  ASSESSMENT / PLAN:  PULMONARY A: Hx of OSA P:  Support with O2  To keep sats>92%   CARDIOVASCULAR A:  NSTEMI Atrial fibrillation Hypotension CAD Hypotension Hx of Hypertension Hx of Hyperlipidemia  P:  Amiodarone bolus Heparin GTT  Continue aspirin Continue Plavix Follow stat  troponin(2.36) Received multiple mini fluid bolus patient might need pressors if not responding, but Tacharrthymias Cardiology following, follow recommendation   RENAL A:   ESRD hyponatremia P:   Dialysed on 11/21/15 Nephrology following Replace electrolytes per ICU protocol  GASTROINTESTINAL A:   Hx of GERD P:   Carb modified to renal diet Continue Pepcid  HEMATOLOGIC A:   No active issues P:  Transfuse if hemoglobin <7  INFECTIOUS A:   No active issues P:   Monitor fever curve  ENDOCRINE A:   Diabetes mellitus Hypothyroidism P:   Blood sugar checks before meals at bedtime Sliding scale insulin coverage Lantus at bedtime Continue Synthroid  NEUROLOGIC A:   No active issues P:   RASS goal: 0 Minimize sedating medication     [name],AG-ACNP Pulmonary and Critical Care Medicine [facility]   11/22/2015, 7:36 PM  STAFF NOTE: I, Dr. Stephanie Acre have personally reviewed patient's available data, including medical history, events of note, physical examination and test results as part of my evaluation. I have discussed with NP Karin Golden  and other care providers such as pharmacist, RN and RRT.   HPI: 82 year old male past medical history of end-stage renal disease, aortic stenosis (moderate-severe) disease, hypertension, admitted for NST EMI in the setting of chest pain, now with hypotension and requiring CVL placement, CCM consulted. Patient noted to have tachyarrhythmia, seen by cardiology, A. fib versus SVT was noted, he has orders for amiodarone bolus along with a drip, however he is limited IV access and with hypotension therefore central line was requested. Small fluid boluses was attempted in order to increase his pressure, but they continue to remain in 60-70 systolic range.    A: 82 year old male with end-stage renal disease on hemodialysis, non-STEMI, chest pain, hypertension  Tachyarrhythmia-A. fib versus  SVT NSTEMI ESRD on HD Hypotension Second Degree Heart block s/p PPM  Aortic stenosis - normal EF, moderate-severe AS   P:  - Right Feml CVL placed - cont with amiodarone gtt - monitor BP - may need to give small 250cc fluid bolus given the level of AS - pressors if needed.  - family discussion held, goals of care along with treatment plan discussed  CODE STATUS - DNR/DNI   .  Rest per NP/medical resident whose note is outlined above and that I agree with  The patient is critically ill with multiple organ systems failure and requires high complexity decision making for assessment and support, frequent evaluation and titration of therapies, application of advanced monitoring technologies and extensive interpretation of multiple databases.   Critical Care Time devoted to patient care services described in this note is 55 Minutes.   This time reflects time of care of this signee Dr Stephanie Acre.  This critical care time  does not reflect procedure time, or teaching time or supervisory time of PA/NP/Med-student/Med Resident etc but could involve care discussion time.  Stephanie Acre, MD [place] [facility] Pager [phone] (please enter 7-digits) On Call Pager - [phone] (please enter 7-digits)  Note: This note was prepared with Dragon dictation along with smaller phrase technology. Any transcriptional errors that result from this process are unintentional.

## 2015-11-22 NOTE — Progress Notes (Signed)
[facility]  ROUNDING NOTE   Subjective:  Patient resting comfortably in bed. Denies chest pain at the moment. Completed hemodialysis yesterday.    Objective:  Vital signs in last 24 hours:  Temp:  [98.3 F (36.8 C)-99.9 F (37.7 C)] 98.3 F (36.8 C) (07/25 0735) Pulse Rate:  [90-106] 100 (07/25 0600) Resp:  [14-23] 14 (07/25 0600) BP: (92-125)/(49-106) 123/106 (07/25 0600) SpO2:  [94 %-99 %] 97 % (07/25 0600) Weight:  [88.7 kg (195 lb 8.8 oz)-92.2 kg (203 lb 4.2 oz)] 88.7 kg (195 lb 8.8 oz) (07/25 0414)  Weight change: 4.202 kg (9 lb 4.2 oz) Filed Weights   11/20/15 1647 11/21/15 1530 11/22/15 0414  Weight: 89.4 kg (197 lb 1.5 oz) 92.2 kg (203 lb 4.2 oz) 88.7 kg (195 lb 8.8 oz)    Intake/Output: I/O last 3 completed shifts: In: 1161.8 [P.O.:460; I.V.:701.8] Out: 500 [Other:500]   Intake/Output this shift:  No intake/output data recorded.  Physical Exam: General: NAD, resting in bed  Head: Normocephalic, atraumatic. Moist oral mucosal membranes  Eyes: Anicteric  Neck: Supple, trachea midline  Lungs:  Room air,  normal effort, mild basilar crackles b/l  Heart: Regular rate and rhythm, crescendo systolic murmur  Abdomen:  Soft, nontender, BS present  Extremities: trace peripheral edema.  Neurologic: Nonfocal, moving all four extremities  Skin: No lesions  Access: LUE AVG, good thrill    Basic Metabolic Panel:  Recent Labs Lab 11/20/15 0906 11/21/15 1600  NA 137 133*  K 3.6 3.9  CL 96* 93*  CO2 28 25  GLUCOSE 260* 146*  BUN 30* 27*  CREATININE 6.73* 5.79*  CALCIUM 10.1 9.2  PHOS  --  2.4*    Liver Function Tests:  Recent Labs Lab 11/21/15 1600  AST 44*  ALT 14*  ALKPHOS 48  BILITOT 0.5  PROT 6.0*  ALBUMIN 2.9*   No results for input(s): LIPASE, AMYLASE in the last 168 hours. No results for input(s): AMMONIA in the last 168 hours.  CBC:  Recent Labs Lab 11/20/15 0906 11/21/15 1600 11/22/15 0550  WBC 9.5 13.3* 10.8*   HGB 12.6* 12.7* 13.4  HCT 36.3* 38.3* 38.2*  MCV 104.4* 103.9* 104.6*  PLT 150 173 153    Cardiac Enzymes:  Recent Labs Lab 11/20/15 0906 11/20/15 1250 11/20/15 1849 11/21/15 0047 11/21/15 1600  TROPONINI 0.07* 0.30* 3.11* 7.07* 6.56*    BNP: Invalid input(s): POCBNP  CBG:  Recent Labs Lab 11/21/15 0738 11/21/15 1112 11/21/15 1556 11/21/15 2116 11/22/15 0713  GLUCAP 102* 210* 161* 148* 124*    Microbiology: Results for orders placed or performed during the hospital encounter of 11/20/15  MRSA PCR Screening     Status: None   Collection Time: 11/20/15  4:45 PM  Result Value Ref Range Status   MRSA by PCR NEGATIVE NEGATIVE Final    Comment:        The GeneXpert MRSA Assay (FDA approved for NASAL specimens only), is one component of a comprehensive MRSA colonization surveillance program. It is not intended to diagnose MRSA infection nor to guide or monitor treatment for MRSA infections.     Coagulation Studies:  Recent Labs  11/20/15 1434  LABPROT 13.4  INR 1.00    Urinalysis: No results for input(s): COLORURINE, LABSPEC, PHURINE, GLUCOSEU, HGBUR, BILIRUBINUR, KETONESUR, PROTEINUR, UROBILINOGEN, NITRITE, LEUKOCYTESUR in the last 72 hours.  Invalid input(s): APPERANCEUR    Imaging: Dg Chest Port 1 View  Result Date: 11/21/2015 CLINICAL DATA:  Chest pain. EXAM: PORTABLE CHEST  1 VIEW COMPARISON:  11/02/2015 FINDINGS: Single lead cardiac pacemaker is stable. There has been an interval placement of right internal jugular approach central venous catheter with tip within the expected location of distal superior vena cava. The cardiac silhouette is enlarged. Mediastinal contours appear intact. Atherosclerotic disease and tortuosity of the aorta are noted. There is no evidence of focal airspace consolidation, pleural effusion or pneumothorax. Mild pulmonary vascular congestion. Osseous structures are without acute abnormality. Soft tissues are grossly  normal. IMPRESSION: Enlarged cardiac silhouette. Atherosclerotic disease of the aorta. Mild pulmonary vascular congestion. Electronically Signed   By: Ted Mcalpine M.D.   On: 11/21/2015 20:38    Medications:   . heparin 1,250 Units/hr (11/22/15 0734)   . allopurinol  100 mg Oral Daily  . aspirin EC  81 mg Oral Daily  . calcium acetate (Phos Binder)  1,334 mg Oral TID WC  . carvedilol  3.125 mg Oral BID WC  . clopidogrel  75 mg Oral Daily  . docusate sodium  100 mg Oral BID  . famotidine (PEPCID) IV  20 mg Intravenous Daily  . finasteride  5 mg Oral Daily  . fluticasone  2 spray Each Nare Daily  . furosemide  40 mg Oral Daily  . gabapentin  100 mg Oral QHS  . insulin aspart  0-9 Units Subcutaneous TID WC  . insulin glargine  10 Units Subcutaneous QHS  . isosorbide mononitrate  30 mg Oral Daily  . levothyroxine  100 mcg Oral QAC breakfast  . loratadine  10 mg Oral Daily  . pantoprazole  40 mg Oral Daily  . simvastatin  20 mg Oral q1800  . sodium chloride flush  3 mL Intravenous Q12H   sodium chloride, acetaminophen **OR** acetaminophen, bisacodyl, calcium acetate, hydroxypropyl methylcellulose / hypromellose, lidocaine-prilocaine, morphine injection, nitroGLYCERIN, ondansetron **OR** ondansetron (ZOFRAN) IV, oxyCODONE-acetaminophen, sodium chloride flush  Assessment/ Plan:  82 y.o. male on hemodialysis Monday, Wednesday, Friday followed by [facility] nephrology, anemia of chronic kidney disease, secondary hyperparathyroid is him, hypertension, hyperlipidemia, diabetes mellitus type 2, hypothyroidism, coronary artery disease, sleep apnea, GERD admitted for chest pain.  [facility] nephrology/MWF/[address]   1. End-stage renal disease on dialysis MWF.  Continue dialysis on MWF schedule. - Patient completed hemodialysis yesterday. No acute indication for dialysis today. We will plan for dialysis again tomorrow.  2. Anemia of chronic kidney disease. Hemoglobin 12.6 at moment. No  indication for Procrit.  3. Secondary hyperparathyroidism. Phosphorus 2.4 at last check. Continue calcium acetate. Follow-up serum phosphorus tomorrow.  4. Chest pain: Troponin slightly down to 6.56. Continue to monitor.   LOS: 2 Arby Dahir 7/25/20179:14 AM

## 2015-11-22 NOTE — Progress Notes (Signed)
eLink Physician-Brief Progress Note Patient Name: Johnathan VANBOXTEL Sr. DOB: [birth year] MRN: [id number]   Date of Service  11/22/2015  HPI/Events of Note  Still with low Bp SBP 60's CVL placed  eICU Interventions  Will start neosynephrine infusion     Intervention Category Major Interventions: Hypotension - evaluation and management  Erin Fulling 11/22/2015, 8:50 PM

## 2015-11-23 LAB — BASIC METABOLIC PANEL
Anion gap: 12 (ref 5–15)
BUN: 28 mg/dL — AB (ref 6–20)
CALCIUM: 9.2 mg/dL (ref 8.9–10.3)
CO2: 29 mmol/L (ref 22–32)
CREATININE: 5.81 mg/dL — AB (ref 0.61–1.24)
Chloride: 91 mmol/L — ABNORMAL LOW (ref 101–111)
GFR calc Af Amer: 9 mL/min — ABNORMAL LOW (ref >60)
GFR, EST NON AFRICAN AMERICAN: 8 mL/min — AB (ref >60)
GLUCOSE: 156 mg/dL — AB (ref 65–99)
POTASSIUM: 4 mmol/L (ref 3.5–5.1)
Sodium: 132 mmol/L — ABNORMAL LOW (ref 135–145)

## 2015-11-23 LAB — HEPATIC FUNCTION PANEL
ALBUMIN: 2.7 g/dL — AB (ref 3.5–5.0)
ALK PHOS: 50 U/L (ref 38–126)
ALT: 13 U/L — ABNORMAL LOW (ref 17–63)
AST: 23 U/L (ref 15–41)
BILIRUBIN TOTAL: 0.4 mg/dL (ref 0.3–1.2)
Total Protein: 6 g/dL — ABNORMAL LOW (ref 6.5–8.1)

## 2015-11-23 LAB — CBC
HCT: 38.9 % — ABNORMAL LOW (ref 40.0–52.0)
Hemoglobin: 12.8 g/dL — ABNORMAL LOW (ref 13.0–18.0)
MCH: 34.8 pg — AB (ref 26.0–34.0)
MCHC: 32.9 g/dL (ref 32.0–36.0)
MCV: 105.8 fL — AB (ref 80.0–100.0)
PLATELETS: 195 10*3/uL (ref 150–440)
RBC: 3.68 MIL/uL — ABNORMAL LOW (ref 4.40–5.90)
RDW: 14.8 % — AB (ref 11.5–14.5)
WBC: 14.9 10*3/uL — ABNORMAL HIGH (ref 3.8–10.6)

## 2015-11-23 LAB — GLUCOSE, CAPILLARY
GLUCOSE-CAPILLARY: 173 mg/dL — AB (ref 65–99)
GLUCOSE-CAPILLARY: 147 mg/dL — AB (ref 65–99)
Glucose-Capillary: 162 mg/dL — ABNORMAL HIGH (ref 65–99)
Glucose-Capillary: 115 mg/dL — ABNORMAL HIGH (ref 65–99)

## 2015-11-23 LAB — PHOSPHORUS: Phosphorus: 4 mg/dL (ref 2.5–4.6)

## 2015-11-23 LAB — TSH: TSH: 1.702 u[IU]/mL (ref 0.350–4.500)

## 2015-11-23 LAB — HEPARIN LEVEL (UNFRACTIONATED): Heparin Unfractionated: 0.36 IU/mL (ref 0.30–0.70)

## 2015-11-23 MED ORDER — NOREPINEPHRINE BITARTRATE 1 MG/ML IV SOLN: 0.0000 ug/min | INTRAVENOUS | Status: AC

## 2015-11-23 MED ORDER — FAMOTIDINE 20 MG PO TABS
20.0000 mg | ORAL_TABLET | Freq: Every day | ORAL | Status: DC
  Administered 2015-11-24: 20 mg via ORAL
  Filled 2015-11-23: qty 1

## 2015-11-23 MED ORDER — NOREPINEPHRINE 4 MG/250ML-% IV SOLN
INTRAVENOUS | Status: AC
  Administered 2015-11-23: 10 ug/min
  Filled 2015-11-23: qty 250

## 2015-11-23 NOTE — Progress Notes (Signed)
[facility]  ROUNDING NOTE   Subjective:  The patient's MI is being medically managed. He denies chest pain this a.m. He is due for hemodialysis today. Orders have been prepared.    Objective:  Vital signs in last 24 hours:  Temp:  [96.9 F (36.1 C)-98.7 F (37.1 C)] 98.7 F (37.1 C) (07/26 0800) Pulse Rate:  [55-128] 90 (07/26 0800) Resp:  [11-26] 21 (07/26 0800) BP: (63-126)/(20-97) 103/53 (07/26 0800) SpO2:  [91 %-99 %] 97 % (07/26 0800) Weight:  [90.2 kg (198 lb 13.7 oz)] 90.2 kg (198 lb 13.7 oz) (07/26 0402)  Weight change: -2 kg (-4 lb 6.6 oz) Filed Weights   11/21/15 1530 11/22/15 0414 11/23/15 0402  Weight: 92.2 kg (203 lb 4.2 oz) 88.7 kg (195 lb 8.8 oz) 90.2 kg (198 lb 13.7 oz)    Intake/Output: I/O last 3 completed shifts: In: 1921.1 [I.V.:1371.1; Other:500; IV Piggyback:50] Out: 500 [Other:500]   Intake/Output this shift:  No intake/output data recorded.  Physical Exam: General: NAD, resting in bed  Head: Normocephalic, atraumatic. Moist oral mucosal membranes  Eyes: Anicteric  Neck: Supple, trachea midline  Lungs:  Room air,  normal effort, mild basilar crackles b/l  Heart: Regular rate and rhythm, crescendo systolic murmur  Abdomen:  Soft, nontender, BS present  Extremities: trace peripheral edema.  Neurologic: Nonfocal, moving all four extremities  Skin: No lesions  Access: LUE AVG, good thrill    Basic Metabolic Panel:  Recent Labs Lab 11/20/15 0906 11/21/15 1600 11/23/15 0500  NA 137 133* 132*  K 3.6 3.9 4.0  CL 96* 93* 91*  CO2 28 25 29   GLUCOSE 260* 146* 156*  BUN 30* 27* 28*  CREATININE 6.73* 5.79* 5.81*  CALCIUM 10.1 9.2 9.2  PHOS  --  2.4*  --     Liver Function Tests:  Recent Labs Lab 11/21/15 1600  AST 44*  ALT 14*  ALKPHOS 48  BILITOT 0.5  PROT 6.0*  ALBUMIN 2.9*   No results for input(s): LIPASE, AMYLASE in the last 168 hours. No results for input(s): AMMONIA in the last 168  hours.  CBC:  Recent Labs Lab 11/20/15 0906 11/21/15 1600 11/22/15 0550 11/23/15 0500  WBC 9.5 13.3* 10.8* 14.9*  HGB 12.6* 12.7* 13.4 12.8*  HCT 36.3* 38.3* 38.2* 38.9*  MCV 104.4* 103.9* 104.6* 105.8*  PLT 150 173 153 195    Cardiac Enzymes:  Recent Labs Lab 11/20/15 1250 11/20/15 1849 11/21/15 0047 11/21/15 1600 11/22/15 1932  TROPONINI 0.30* 3.11* 7.07* 6.56* 2.36*    BNP: Invalid input(s): POCBNP  CBG:  Recent Labs Lab 11/22/15 0713 11/22/15 1142 11/22/15 1725 11/22/15 2149 11/23/15 0711  GLUCAP 124* 180* 146* 159* 162*    Microbiology: Results for orders placed or performed during the hospital encounter of 11/20/15  MRSA PCR Screening     Status: None   Collection Time: 11/20/15  4:45 PM  Result Value Ref Range Status   MRSA by PCR NEGATIVE NEGATIVE Final    Comment:        The GeneXpert MRSA Assay (FDA approved for NASAL specimens only), is one component of a comprehensive MRSA colonization surveillance program. It is not intended to diagnose MRSA infection nor to guide or monitor treatment for MRSA infections.     Coagulation Studies:  Recent Labs  11/20/15 1434  LABPROT 13.4  INR 1.00    Urinalysis: No results for input(s): COLORURINE, LABSPEC, PHURINE, GLUCOSEU, HGBUR, BILIRUBINUR, KETONESUR, PROTEINUR, UROBILINOGEN, NITRITE, LEUKOCYTESUR in the last 72  hours.  Invalid input(s): APPERANCEUR    Imaging: Dg Chest Port 1 View  Result Date: 11/21/2015 CLINICAL DATA:  Chest pain. EXAM: PORTABLE CHEST 1 VIEW COMPARISON:  11/02/2015 FINDINGS: Single lead cardiac pacemaker is stable. There has been an interval placement of right internal jugular approach central venous catheter with tip within the expected location of distal superior vena cava. The cardiac silhouette is enlarged. Mediastinal contours appear intact. Atherosclerotic disease and tortuosity of the aorta are noted. There is no evidence of focal airspace consolidation,  pleural effusion or pneumothorax. Mild pulmonary vascular congestion. Osseous structures are without acute abnormality. Soft tissues are grossly normal. IMPRESSION: Enlarged cardiac silhouette. Atherosclerotic disease of the aorta. Mild pulmonary vascular congestion. Electronically Signed   By: Ted Mcalpine M.D.   On: 11/21/2015 20:38    Medications:   . amiodarone 30 mg/hr (11/23/15 0700)  . heparin 1,250 Units/hr (11/23/15 0700)  . norepinephrine (LEVOPHED) Adult infusion 2 mcg/min (11/23/15 0847)   . allopurinol  100 mg Oral Daily  . aspirin EC  81 mg Oral Daily  . calcium acetate (Phos Binder)  1,334 mg Oral TID WC  . clopidogrel  75 mg Oral Daily  . docusate sodium  100 mg Oral BID  . famotidine (PEPCID) IV  20 mg Intravenous Daily  . finasteride  5 mg Oral Daily  . fluticasone  2 spray Each Nare Daily  . gabapentin  100 mg Oral QHS  . insulin aspart  0-9 Units Subcutaneous TID WC  . insulin glargine  10 Units Subcutaneous QHS  . levothyroxine  100 mcg Oral QAC breakfast  . loratadine  10 mg Oral Daily  . pantoprazole  40 mg Oral Daily  . simvastatin  20 mg Oral q1800  . sodium chloride flush  3 mL Intravenous Q12H   sodium chloride, acetaminophen **OR** acetaminophen, bisacodyl, calcium acetate, hydroxypropyl methylcellulose / hypromellose, lidocaine-prilocaine, morphine injection, nitroGLYCERIN, ondansetron **OR** ondansetron (ZOFRAN) IV, oxyCODONE-acetaminophen, sodium chloride flush  Assessment/ Plan:  82 y.o. male on hemodialysis Monday, Wednesday, Friday followed by [facility] nephrology, anemia of chronic kidney disease, secondary hyperparathyroid is him, hypertension, hyperlipidemia, diabetes mellitus type 2, hypothyroidism, coronary artery disease, sleep apnea, GERD admitted for chest pain.  [facility] nephrology/MWF/[address]   1. End-stage renal disease on dialysis MWF.  Continue dialysis on MWF schedule. - Patient due for hemodialysis today. Ultrafiltration target  1.5-2 kg.  2. Anemia of chronic kidney disease. Hemoglobin remains high at 12.8. No indication for Procrit at the moment.  3. Secondary hyperparathyroidism. Recheck phosphorus today. Continue calcium acetate for phosphorous control for now.  4. Chest pain: No new troponin today. Yesterday troponin was 6.56. No chest pain at the moment.   LOS: 3 Shambhavi Salley 7/26/20178:54 AM

## 2015-11-23 NOTE — Progress Notes (Signed)
ANTICOAGULATION CONSULT NOTE - Initial Consult  Pharmacy Consult for heparin drip Indication: chest pain/ACS  Allergies  Allergen Reactions  . Cinacalcet Other (See Comments)    Reaction: unknown  . Lisinopril Cough    Patient Measurements: Height: 5\' 6"  (167.6 cm) Weight: 195 lb 8.8 oz (88.7 kg) IBW/kg (Calculated) : 63.8 Heparin Dosing Weight: 80kg  Vital Signs: Temp: 96.9 F (36.1 C) (07/26 0000) Temp Source: Axillary (07/26 0000) BP: 96/46 (07/26 0045) Pulse Rate: 87 (07/26 0045)  Labs:  Recent Labs  11/20/15 0906  11/20/15 1434  11/21/15 0047 11/21/15 1600 11/22/15 0550 11/22/15 1550 11/22/15 1932 11/23/15 0013  HGB 12.6*  --   --   --   --  12.7* 13.4  --   --   --   HCT 36.3*  --   --   --   --  38.3* 38.2*  --   --   --   PLT 150  --   --   --   --  173 153  --   --   --   APTT  --   --  27  --   --   --   --   --   --   --   LABPROT  --   --  13.4  --   --   --   --   --   --   --   INR  --   --  1.00  --   --   --   --   --   --   --   HEPARINUNFRC  --   --   --   < >  --  0.25* 0.76* 0.57  --  0.36  CREATININE 6.73*  --   --   --   --  5.79*  --   --   --   --   TROPONINI 0.07*  < >  --   < > 7.07* 6.56*  --   --  2.36*  --   < > = values in this interval not displayed.  Estimated Creatinine Clearance: 8.7 mL/min (by C-G formula based on SCr of 5.79 mg/dL).   Medical History: Past Medical History:  Diagnosis Date  . Arthritis   . Bronchitis   . Chronic kidney disease   . Coronary artery disease   . Diabetes mellitus without complication (HCC)   . GERD (gastroesophageal reflux disease)   . Gout   . Hypercholesterolemia   . Hyperlipidemia   . Hypertension   . Hypothyroidism   . Obesity   . Shingles   . Sleep apnea    Assessment: 82 yo male with chest pain and elevated troponin. Pharmacy consulted for heparin drip for ACS/Chest pain    Goal of Therapy:  Heparin level 0.3-0.7 units/ml Monitor platelets by anticoagulation protocol: Yes   Plan:  Heparin level= 0.57. Will continue heparin infusion at 1250 units/hr and check a confirmatory level in 8 hours.    7/26 00:15 heparin level 0.36. Recheck with CBC tomorrow AM.  Luisa Hart, PharmD Clinical Pharmacist  11/23/2015 1:43 AM

## 2015-11-23 NOTE — Progress Notes (Signed)
Dialysis started 

## 2015-11-23 NOTE — Progress Notes (Signed)
Post dialysis 

## 2015-11-23 NOTE — Progress Notes (Signed)
Pre Dialysis 

## 2015-11-23 NOTE — Progress Notes (Signed)
[facility]  SUBJECTIVE: somewhat lethergic but denies sob or cp   Vitals:   11/23/15 0615 11/23/15 0630 11/23/15 0700 11/23/15 0800  BP: 105/63 108/63 103/63 (!) 103/53  Pulse: 83 85 86 90  Resp: 16 15 16  (!) 21  Temp:    98.7 F (37.1 C)  TempSrc:    Oral  SpO2: 96% 95% 95% 97%  Weight:      Height:        Intake/Output Summary (Last 24 hours) at 11/23/15 1110 Last data filed at 11/23/15 0950  Gross per 24 hour  Intake          1640.84 ml  Output                0 ml  Net          1640.84 ml    LABS: Basic Metabolic Panel:  Recent Labs  25/00/37 1600 11/23/15 0500  NA 133* 132*  K 3.9 4.0  CL 93* 91*  CO2 25 29  GLUCOSE 146* 156*  BUN 27* 28*  CREATININE 5.79* 5.81*  CALCIUM 9.2 9.2  PHOS 2.4*  --    Liver Function Tests:  Recent Labs  11/21/15 1600  AST 44*  ALT 14*  ALKPHOS 48  BILITOT 0.5  PROT 6.0*  ALBUMIN 2.9*   No results for input(s): LIPASE, AMYLASE in the last 72 hours. CBC:  Recent Labs  11/22/15 0550 11/23/15 0500  WBC 10.8* 14.9*  HGB 13.4 12.8*  HCT 38.2* 38.9*  MCV 104.6* 105.8*  PLT 153 195   Cardiac Enzymes:  Recent Labs  11/21/15 0047 11/21/15 1600 11/22/15 1932  TROPONINI 7.07* 6.56* 2.36*   BNP: Invalid input(s): POCBNP D-Dimer: No results for input(s): DDIMER in the last 72 hours. Hemoglobin A1C: No results for input(s): HGBA1C in the last 72 hours. Fasting Lipid Panel: No results for input(s): CHOL, HDL, LDLCALC, TRIG, CHOLHDL, LDLDIRECT in the last 72 hours. Thyroid Function Tests: No results for input(s): TSH, T4TOTAL, T3FREE, THYROIDAB in the last 72 hours.  Invalid input(s): FREET3 Anemia Panel: No results for input(s): VITAMINB12, FOLATE, FERRITIN, TIBC, IRON, RETICCTPCT in the last 72 hours.   Physical Exam: Blood pressure (!) 103/53, pulse 90, temperature 98.7 F (37.1 C), temperature source Oral, resp. rate (!) 21, height 5\' 6"  (1.676 m), weight 90.2 kg  (198 lb 13.7 oz), SpO2 97 %.   Wt Readings from Last 1 Encounters:  11/23/15 90.2 kg (198 lb 13.7 oz)     General appearance: cooperative Resp: diminished breath sounds bibasilar Cardio: regular rate and rhythm GI: soft, non-tender; bowel sounds normal; no masses,  no organomegaly Extremities: edema 2+ Neurologic: Grossly normal  TELEMETRY: Reviewed telemetry pt in appears to be in nsr. Tachycardia of past 24 hours appeared to be afib with rvr vs sinus tach. Improved with amiodarone:  ASSESSMENT AND PLAN:  Principal Problem:   NSTEMI (non-ST elevated myocardial infarction) (HCC)-currently without cp. WIll continue medical therapy if possible. Will wean levo as map stays greater than60 Active Problems:   Atypical chest pain   ESRD on hemodialysis (HCC)-hd today   HTN, goal below 140/80   Tachyarrhythmia-continue with amiodarone load.    Arterial hypotension-will wean levo to keep map greater than 60. Will leave in place for hd.     Dalia Heading., MD, [facility] 11/23/2015 11:10 AM

## 2015-11-23 NOTE — Progress Notes (Signed)
Dialysis complete

## 2015-11-23 NOTE — Progress Notes (Signed)
[employer] - [place] [facility]   PATIENT NAME: [name]    MR#:  [id number]  DATE OF BIRTH:  11/03/1922  SUBJECTIVE:   Patient Started on amiodarone drip last night for atrial fibrillation/SVT Blood pressure low so also on levophed  Denies chest pain Not as confused this am  REVIEW OF SYSTEMS:    Review of Systems  Constitutional: Negative.  Negative for chills, fever and malaise/fatigue.  HENT: Negative.  Negative for ear discharge, ear pain, hearing loss, nosebleeds and sore throat.   Eyes: Negative.  Negative for blurred vision and pain.  Respiratory: Negative.  Negative for cough, hemoptysis, shortness of breath and wheezing.   Cardiovascular: Negative.  Negative for chest pain, palpitations and leg swelling.  Gastrointestinal: Negative.  Negative for abdominal pain, blood in stool, diarrhea, nausea and vomiting.  Genitourinary: Negative.  Negative for dysuria.  Musculoskeletal: Negative.  Negative for back pain.  Skin: Negative.   Neurological: Negative for dizziness, tremors, speech change, focal weakness, seizures and headaches.  Endo/Heme/Allergies: Negative.  Does not bruise/bleed easily.  Psychiatric/Behavioral: Positive for memory loss. Negative for depression, hallucinations and suicidal ideas.    Tolerating Diet: yes      DRUG ALLERGIES:   Allergies  Allergen Reactions  . Cinacalcet Other (See Comments)    Reaction: unknown  . Lisinopril Cough    VITALS:  Blood pressure (!) 103/53, pulse 90, temperature 98.7 F (37.1 C), temperature source Oral, resp. rate (!) 21, height 5\' 6"  (1.676 m), weight 90.2 kg (198 lb 13.7 oz), SpO2 97 %.  PHYSICAL EXAMINATION:   Physical Exam  Constitutional: He is well-developed, well-nourished, and in no distress. No distress.  HENT:  Head: Normocephalic.  Eyes: No scleral icterus.  Neck: Normal range of motion. Neck supple. No JVD present. No tracheal deviation present.  Cardiovascular: Normal  rate and regular rhythm.  Exam reveals no gallop and no friction rub.   Murmur heard. Pulmonary/Chest: Effort normal and breath sounds normal. No respiratory distress. He has no wheezes. He has no rales. He exhibits no tenderness.  Abdominal: Soft. Bowel sounds are normal. He exhibits no distension and no mass. There is no tenderness. There is no rebound and no guarding.  Musculoskeletal: Normal range of motion. He exhibits no edema.  Neurological: He is alert.  Skin: Skin is warm. No rash noted. No erythema.      LABORATORY PANEL:   CBC  Recent Labs Lab 11/23/15 0500  WBC 14.9*  HGB 12.8*  HCT 38.9*  PLT 195   ------------------------------------------------------------------------------------------------------------------  Chemistries   Recent Labs Lab 11/21/15 1600 11/23/15 0500  NA 133* 132*  K 3.9 4.0  CL 93* 91*  CO2 25 29  GLUCOSE 146* 156*  BUN 27* 28*  CREATININE 5.79* 5.81*  CALCIUM 9.2 9.2  AST 44*  --   ALT 14*  --   ALKPHOS 48  --   BILITOT 0.5  --    ------------------------------------------------------------------------------------------------------------------  Cardiac Enzymes  Recent Labs Lab 11/21/15 0047 11/21/15 1600 11/22/15 1932  TROPONINI 7.07* 6.56* 2.36*   ------------------------------------------------------------------------------------------------------------------  RADIOLOGY:  Dg Chest Port 1 View  Result Date: 11/21/2015 CLINICAL DATA:  Chest pain. EXAM: PORTABLE CHEST 1 VIEW COMPARISON:  11/02/2015 FINDINGS: Single lead cardiac pacemaker is stable. There has been an interval placement of right internal jugular approach central venous catheter with tip within the expected location of distal superior vena cava. The cardiac silhouette is enlarged. Mediastinal contours appear intact. Atherosclerotic disease and tortuosity of the  aorta are noted. There is no evidence of focal airspace consolidation, pleural effusion or  pneumothorax. Mild pulmonary vascular congestion. Osseous structures are without acute abnormality. Soft tissues are grossly normal. IMPRESSION: Enlarged cardiac silhouette. Atherosclerotic disease of the aorta. Mild pulmonary vascular congestion. Electronically Signed   By: Ted Mcalpine M.D.   On: 11/21/2015 20:38    ASSESSMENT AND PLAN:   82 year old male with ESRD on hemodialysis, anemia of chronic disease, diabetes and CAD who presented with chest pain and found to have non-STEMI.   1. Non-ST elevation MI: Troponin max is 7.07, Now trending down. Continue heparin drip l Continue aspirin, Plavix, isosorbide, low-dose Coreg and statin Echo shows EF 50-55% with hypokinesis of apical myocardium and moderate to severe aortic valve stenosis. Patient not a candidate for cardiac catheterization, fairly high risk as per cardiology. Continue aggressive medical management.. Continue nitroglycerin/morphine for pain.  2. Tachycardia arrhythmia, likely atrial fibrillation with RVR: Continue amiodarone drip. Heart rate better controlled and probably can be transitioned to oral amiodarone today. Check TSH and LFT 3. ESRD and hemodialysis: Continue hemodialysis Monday, Wednesday and Friday.  4. Diabetes: Continue sliding scale insulin and Lantus. A1c reported at 7.4 5. BPH: Continue finasteride. 6. Aortic valve stenosis: Patient not candidate for valve replacement. 7. Hypothyroidism: Continue Synthroid.  8. Delirium: This is related to ICU delirium as well as current medical issues. Continue to reorient patient. Improved symptoms.  9. Hypotension most likely due to tachycardia. Hold hypertensive medications. Wean levophed to keep MAP>65       CODE STATUS: DNR  CRITICAL CARE TOTAL TIME TAKING CARE OF THIS PATIENT: 28 minutes.   Given hypotension and tachycardia high risk of cardiac decompensation  POSSIBLE D/C ??, DEPENDING ON CLINICAL CONDITION.   Dashae Wilcher M.D on  11/23/2015 at 11:05 AM  Between 7am to 6pm - Pager - [phone] After 6pm go to [email or web address] - password [email or web address]  [facility]  Office  [phone]  CC: Primary care physician; Marisue Ivan, MD  Note: This dictation was prepared with Dragon dictation along with smaller phrase technology. Any transcriptional errors that result from this process are unintentional.

## 2015-11-23 NOTE — Progress Notes (Signed)
Name:           Johnathan DEMARTIN Sr. MRN:             [id number] DOB:             1923/03/09                     ADMISSION DATE:  11/20/2015 CONSULTATION DATE:11/22/15    DISCUSSION: 82 year old male  With HX OFbronchitis, chronic kidney disease, coronary artery disease, MI, diabetes mellitus, GERD, hyperlipidemia, hypertension, sleep apnea, Now admitted with NSTEMI, and  hypotensive exhibiting  tachyrhythmia requiring amiodarone infusion and mini fluid bolus.  ASSESSMENT / PLAN:  PULMONARY A: Hx of OSA P:  Support with O2  To keep sats>92%   CARDIOVASCULAR A:  NSTEMI Atrial fibrillation Hypotension CAD Hypotension Hx of Hypertension Hx of Hyperlipidemia  P:   Heparin GTT  Continue aspirin Continue Plavix Amiodarone gtt Currently off pressors, maintaining MAP of 65 Cardiology following, follow recommendation   RENAL A:   ESRD hyponatremia P:   Dialysed on 11/21/15, plan for dialysis today Nephrology following Replace electrolytes per ICU protocol  GASTROINTESTINAL A:   Hx of GERD P:   Carb modified to renal diet Continue Pepcid  HEMATOLOGIC A:   No active issues P:  Transfuse if hemoglobin <7  INFECTIOUS A:   No active issues P:   Monitor fever curve  CULTURES: none  ANTIBIOTICS: none  ENDOCRINE A:   Diabetes mellitus Hypothyroidism Secondary hyperparathyroidism P:   Blood sugar checks before meals at bedtime Sliding scale insulin coverage Lantus at bedtime Continue Synthroid Phosphorus check, and continue calcium acetate for phosphorus control per nephrology recommendation  NEUROLOGIC A:   No active issues P:   RASS goal: 0 Minimize sedating medication  SIGNIFICANT EVENTS: 7/23 Patient admitted to ICU for NSTEMI>>  LINES/TUBES: 11/22/15 right femoral central line>>  STUDIES:  7/24 ECHO>>Aortic valve stenosis,EF of 50 -55% \  ------------------------------------  SUBJECTIVE:  Patient states that he had a rough  night and was not able to get any rest during night.  Patient is alert and oriented, is off pressors at this time. Amiodarone and heparin currently infusing.  PHYSICAL EXAMINATION: General:  Elderly male , found with tachycardia, in no acute distress Neuro: Awake, alert, oriented, follows command, no focal deficit HEENT: Atraumatic, normocephalic, no discharge Cardiovascular: ST, no MRG noted Lungs:  Clear, diminished towards bases, no wheezes, crackles, rhonchi noted. Abdomen:  Obese, round, active bowel sounds Musculoskeletal:  No inflammation /deformity noted Skin:  Grossly intact  Imaging Dg Chest Port 1 View  Result Date: 11/21/2015 CLINICAL DATA:  Chest pain. EXAM: PORTABLE CHEST 1 VIEW COMPARISON:  11/02/2015 FINDINGS: Single lead cardiac pacemaker is stable. There has been an interval placement of right internal jugular approach central venous catheter with tip within the expected location of distal superior vena cava. The cardiac silhouette is enlarged. Mediastinal contours appear intact. Atherosclerotic disease and tortuosity of the aorta are noted. There is no evidence of focal airspace consolidation, pleural effusion or pneumothorax. Mild pulmonary vascular congestion. Osseous structures are without acute abnormality. Soft tissues are grossly normal. IMPRESSION: Enlarged cardiac silhouette. Atherosclerotic disease of the aorta. Mild pulmonary vascular congestion. Electronically Signed   By: Ted Mcalpine M.D.   On: 11/21/2015 20:38    [name],AG-ACNP Pulmonary & Critical Care    Pt seen and examined with NP and agree with assessment and plan, 82 year old male  With HX OFbronchitis, chronic kidney  disease, coronary artery disease, MI.  Now admitted with NSTEMI, and  hypotensive exhibiting  tachyrhythmia requiring amiodarone infusion and mini fluid bolus. Pt more awake today lungs CTA.   [name], M.D.  11/23/2015   Critical Care Attestation.  I  have personally obtained a history, examined the patient, evaluated laboratory and imaging results, formulated the assessment and plan and placed orders. The Patient requires high complexity decision making for assessment and support, frequent evaluation and titration of therapies, application of advanced monitoring technologies and extensive interpretation of multiple databases. The patient has critical illness that could lead imminently to failure of 1 or more organ systems and requires the highest level of physician preparedness to intervene.  Critical Care Time devoted to patient care services described in this note is 40 minutes and is exclusive of time spent in procedures.

## 2015-11-24 LAB — BASIC METABOLIC PANEL
ANION GAP: 18 — AB (ref 5–15)
BUN: 18 mg/dL (ref 6–20)
CO2: 28 mmol/L (ref 22–32)
CREATININE: 4.57 mg/dL — AB (ref 0.61–1.24)
Calcium: 9.5 mg/dL (ref 8.9–10.3)
Chloride: 92 mmol/L — ABNORMAL LOW (ref 101–111)
GFR calc non Af Amer: 10 mL/min — ABNORMAL LOW (ref >60)
GFR, EST AFRICAN AMERICAN: 12 mL/min — AB (ref >60)
Glucose, Bld: 138 mg/dL — ABNORMAL HIGH (ref 65–99)
POTASSIUM: 3.7 mmol/L (ref 3.5–5.1)
Sodium: 138 mmol/L (ref 135–145)

## 2015-11-24 LAB — MAGNESIUM: MAGNESIUM: 1.7 mg/dL (ref 1.7–2.4)

## 2015-11-24 LAB — GLUCOSE, CAPILLARY
GLUCOSE-CAPILLARY: 140 mg/dL — AB (ref 65–99)
GLUCOSE-CAPILLARY: 193 mg/dL — AB (ref 65–99)
GLUCOSE-CAPILLARY: 167 mg/dL — AB (ref 65–99)
GLUCOSE-CAPILLARY: 159 mg/dL — AB (ref 65–99)

## 2015-11-24 LAB — HEPARIN LEVEL (UNFRACTIONATED): HEPARIN UNFRACTIONATED: 0.35 [IU]/mL (ref 0.30–0.70)

## 2015-11-24 LAB — CBC
HEMATOCRIT: 39.3 % — AB (ref 40.0–52.0)
HEMOGLOBIN: 13.3 g/dL (ref 13.0–18.0)
MCH: 35.5 pg — ABNORMAL HIGH (ref 26.0–34.0)
MCHC: 33.7 g/dL (ref 32.0–36.0)
MCV: 105.2 fL — AB (ref 80.0–100.0)
Platelets: 215 10*3/uL (ref 150–440)
RBC: 3.73 MIL/uL — AB (ref 4.40–5.90)
RDW: 14.8 % — AB (ref 11.5–14.5)
WBC: 16.4 10*3/uL — AB (ref 3.8–10.6)

## 2015-11-24 LAB — PROCALCITONIN: PROCALCITONIN: 1.45 ng/mL

## 2015-11-24 MED ORDER — HEPARIN SODIUM (PORCINE) 5000 UNIT/ML IJ SOLN
5000.0000 [IU] | Freq: Three times a day (TID) | INTRAMUSCULAR | Status: DC
  Administered 2015-11-24 – 2015-11-28 (×13): 5000 [IU] via SUBCUTANEOUS
  Filled 2015-11-24 (×13): qty 1

## 2015-11-24 MED ORDER — MIDODRINE HCL 5 MG PO TABS
10.0000 mg | ORAL_TABLET | Freq: Three times a day (TID) | ORAL | Status: DC
  Administered 2015-11-24 – 2015-11-28 (×12): 10 mg via ORAL
  Filled 2015-11-24 (×12): qty 2

## 2015-11-24 MED ORDER — COSYNTROPIN 0.25 MG IJ SOLR
0.2500 mg | Freq: Once | INTRAMUSCULAR | Status: AC
  Administered 2015-11-25: 0.25 mg via INTRAVENOUS
  Filled 2015-11-24: qty 0.25

## 2015-11-24 NOTE — Progress Notes (Deleted)
[facility]  SUBJECTIVE: sob   Vitals:   11/24/15 0800 11/24/15 0914 11/24/15 1000 11/24/15 1100  BP: (!) 122/24 (!) 118/52 (!) 108/39 (!) 113/32  Pulse: 92 (!) 102 88 92  Resp: 14 20 15  (!) 24  Temp: 97.8 F (36.6 C)     TempSrc: Oral     SpO2: 95% 94% 94% 94%  Weight:      Height:        Intake/Output Summary (Last 24 hours) at 11/24/15 1349 Last data filed at 11/24/15 0000  Gross per 24 hour  Intake           769.57 ml  Output             1500 ml  Net          -730.43 ml    LABS: Basic Metabolic Panel:  Recent Labs  38/46/65 1600 11/23/15 0500 11/23/15 1520 11/24/15 0441  NA 133* 132*  --  138  K 3.9 4.0  --  3.7  CL 93* 91*  --  92*  CO2 25 29  --  28  GLUCOSE 146* 156*  --  138*  BUN 27* 28*  --  18  CREATININE 5.79* 5.81*  --  4.57*  CALCIUM 9.2 9.2  --  9.5  MG  --   --   --  1.7  PHOS 2.4*  --  4.0  --    Liver Function Tests:  Recent Labs  11/21/15 1600 11/23/15 1202  AST 44* 23  ALT 14* 13*  ALKPHOS 48 50  BILITOT 0.5 0.4  PROT 6.0* 6.0*  ALBUMIN 2.9* 2.7*   No results for input(s): LIPASE, AMYLASE in the last 72 hours. CBC:  Recent Labs  11/23/15 0500 11/24/15 0441  WBC 14.9* 16.4*  HGB 12.8* 13.3  HCT 38.9* 39.3*  MCV 105.8* 105.2*  PLT 195 215   Cardiac Enzymes:  Recent Labs  11/21/15 1600 11/22/15 1932  TROPONINI 6.56* 2.36*   BNP: Invalid input(s): POCBNP D-Dimer: No results for input(s): DDIMER in the last 72 hours. Hemoglobin A1C: No results for input(s): HGBA1C in the last 72 hours. Fasting Lipid Panel: No results for input(s): CHOL, HDL, LDLCALC, TRIG, CHOLHDL, LDLDIRECT in the last 72 hours. Thyroid Function Tests:  Recent Labs  11/23/15 1202  TSH 1.702   Anemia Panel: No results for input(s): VITAMINB12, FOLATE, FERRITIN, TIBC, IRON, RETICCTPCT in the last 72 hours.   Physical Exam: Blood pressure (!) 113/32, pulse 92, temperature 97.8 F (36.6 C), temperature  source Oral, resp. rate (!) 24, height 5\' 6"  (1.676 m), weight 92.3 kg (203 lb 7.8 oz), SpO2 94 %.   Wt Readings from Last 1 Encounters:  11/24/15 92.3 kg (203 lb 7.8 oz)     General appearance: alert and cooperative Resp: clear to auscultation bilaterally Cardio: regular rate and rhythm Extremities: edema 2+  TELEMETRY: Reviewed telemetry pt in nsr  ASSESSMENT AND PLAN:  Principal Problem:   NSTEMI (non-ST elevated myocardial infarction) (HCC)-likely demand ischemia due to cardiomyopathy and work of breath.* WIll need to defer cath until thrombus is stabilized. Active Problems:   Atypical chest pain   ESRD on hemodialysis (HCC)   HTN, goal below 140/80   Tachyarrhythmia   Arterial hypotension  LV thrombus-iv heparin followed by warfarin.   Dalia Heading., MD, [facility] 11/24/2015 1:49 PM

## 2015-11-24 NOTE — Progress Notes (Signed)
[facility]  ROUNDING NOTE   Subjective:  Patient currently on amiodarone drip. He completed hemodialysis yesterday. He currently denies chest pain.  Objective:  Vital signs in last 24 hours:  Temp:  [97.6 F (36.4 C)-99.3 F (37.4 C)] 97.8 F (36.6 C) (07/27 0800) Pulse Rate:  [83-114] 102 (07/27 0914) Resp:  [6-26] 20 (07/27 0914) BP: (69-201)/(19-145) 118/52 (07/27 0914) SpO2:  [88 %-100 %] 94 % (07/27 0914) Weight:  [90.8 kg (200 lb 2.8 oz)-92.3 kg (203 lb 7.8 oz)] 92.3 kg (203 lb 7.8 oz) (07/27 0450)  Weight change: 2.1 kg (4 lb 10.1 oz) Filed Weights   11/23/15 1513 11/23/15 1855 11/24/15 0450  Weight: 92.3 kg (203 lb 7.8 oz) 90.8 kg (200 lb 2.8 oz) 92.3 kg (203 lb 7.8 oz)    Intake/Output: I/O last 3 completed shifts: In: 2239.8 [P.O.:480; I.V.:1759.8] Out: 1500 [Other:1500]   Intake/Output this shift:  No intake/output data recorded.  Physical Exam: General: NAD, resting in bed  Head: Normocephalic, atraumatic. Moist oral mucosal membranes  Eyes: Anicteric  Neck: Supple, trachea midline  Lungs:  Clear to auscultation bilateral, normal effort   Heart: Irregular, crescendo systolic murmur  Abdomen:  Soft, nontender, BS present  Extremities: trace peripheral edema.  Neurologic: Nonfocal, moving all four extremities  Skin: No lesions  Access: LUE AVG, good thrill    Basic Metabolic Panel:  Recent Labs Lab 11/20/15 0906 11/21/15 1600 11/23/15 0500 11/23/15 1520 11/24/15 0441  NA 137 133* 132*  --  138  K 3.6 3.9 4.0  --  3.7  CL 96* 93* 91*  --  92*  CO2 28 25 29   --  28  GLUCOSE 260* 146* 156*  --  138*  BUN 30* 27* 28*  --  18  CREATININE 6.73* 5.79* 5.81*  --  4.57*  CALCIUM 10.1 9.2 9.2  --  9.5  MG  --   --   --   --  1.7  PHOS  --  2.4*  --  4.0  --     Liver Function Tests:  Recent Labs Lab 11/21/15 1600 11/23/15 1202  AST 44* 23  ALT 14* 13*  ALKPHOS 48 50  BILITOT 0.5 0.4  PROT 6.0* 6.0*  ALBUMIN 2.9* 2.7*    No results for input(s): LIPASE, AMYLASE in the last 168 hours. No results for input(s): AMMONIA in the last 168 hours.  CBC:  Recent Labs Lab 11/20/15 0906 11/21/15 1600 11/22/15 0550 11/23/15 0500 11/24/15 0441  WBC 9.5 13.3* 10.8* 14.9* 16.4*  HGB 12.6* 12.7* 13.4 12.8* 13.3  HCT 36.3* 38.3* 38.2* 38.9* 39.3*  MCV 104.4* 103.9* 104.6* 105.8* 105.2*  PLT 150 173 153 195 215    Cardiac Enzymes:  Recent Labs Lab 11/20/15 1250 11/20/15 1849 11/21/15 0047 11/21/15 1600 11/22/15 1932  TROPONINI 0.30* 3.11* 7.07* 6.56* 2.36*    BNP: Invalid input(s): POCBNP  CBG:  Recent Labs Lab 11/23/15 0711 11/23/15 1147 11/23/15 1620 11/23/15 2133 11/24/15 0722  GLUCAP 162* 173* 147* 115* 140*    Microbiology: Results for orders placed or performed during the hospital encounter of 11/20/15  MRSA PCR Screening     Status: None   Collection Time: 11/20/15  4:45 PM  Result Value Ref Range Status   MRSA by PCR NEGATIVE NEGATIVE Final    Comment:        The GeneXpert MRSA Assay (FDA approved for NASAL specimens only), is one component of a comprehensive MRSA colonization surveillance program. It  is not intended to diagnose MRSA infection nor to guide or monitor treatment for MRSA infections.     Coagulation Studies: No results for input(s): LABPROT, INR in the last 72 hours.  Urinalysis: No results for input(s): COLORURINE, LABSPEC, PHURINE, GLUCOSEU, HGBUR, BILIRUBINUR, KETONESUR, PROTEINUR, UROBILINOGEN, NITRITE, LEUKOCYTESUR in the last 72 hours.  Invalid input(s): APPERANCEUR    Imaging: No results found.   Medications:   . amiodarone 30 mg/hr (11/24/15 0900)  . norepinephrine (LEVOPHED) Adult infusion 10 mcg/min (11/24/15 0915)   . allopurinol  100 mg Oral Daily  . aspirin EC  81 mg Oral Daily  . calcium acetate (Phos Binder)  1,334 mg Oral TID WC  . clopidogrel  75 mg Oral Daily  . docusate sodium  100 mg Oral BID  . famotidine  20 mg Oral  Daily  . finasteride  5 mg Oral Daily  . fluticasone  2 spray Each Nare Daily  . gabapentin  100 mg Oral QHS  . insulin aspart  0-9 Units Subcutaneous TID WC  . insulin glargine  10 Units Subcutaneous QHS  . levothyroxine  100 mcg Oral QAC breakfast  . loratadine  10 mg Oral Daily  . pantoprazole  40 mg Oral Daily  . simvastatin  20 mg Oral q1800  . sodium chloride flush  3 mL Intravenous Q12H   sodium chloride, acetaminophen **OR** acetaminophen, bisacodyl, calcium acetate, hydroxypropyl methylcellulose / hypromellose, lidocaine-prilocaine, morphine injection, nitroGLYCERIN, ondansetron **OR** ondansetron (ZOFRAN) IV, oxyCODONE-acetaminophen, sodium chloride flush  Assessment/ Plan:  82 y.o. male on hemodialysis Monday, Wednesday, Friday followed by [facility] nephrology, anemia of chronic kidney disease, secondary hyperparathyroid is him, hypertension, hyperlipidemia, diabetes mellitus type 2, hypothyroidism, coronary artery disease, sleep apnea, GERD admitted for chest pain.  [facility] nephrology/MWF/[address]   1. End-stage renal disease on dialysis MWF.  Continue dialysis on MWF schedule. - Patient completed hemodialysis yesterday. No acute indication for dialysis today. We will plan for hemodialysis again tomorrow.  2. Anemia of chronic kidney disease. Hemoglobin up to 13. No indication for Procrit at the moment.  3. Secondary hyperparathyroidism. Phosphorus 4.0. Continue to monitor.  4. Chest pain: As per cardiology. Patient currently on amiodarone drip.  LOS: 4 Aubery Date 7/27/20179:55 AM

## 2015-11-24 NOTE — Progress Notes (Signed)
Care handed off to [facility], RN taking over care of pt, report given to Myra.  Pt is alert, pleasantly confused to situation and time.  Follows commands.  VSS, pt remains on levophed (currently at 8.5 mcg).  Goal for titration of levophed is SBP >90.  Pt underwent speech evaluation, passed, recommendation for po meds in applesauce.  Anuric, is a hemodialysis patient.

## 2015-11-24 NOTE — Progress Notes (Signed)
ANTICOAGULATION CONSULT NOTE - Initial Consult  Pharmacy Consult for heparin drip Indication: chest pain/ACS  Allergies  Allergen Reactions  . Cinacalcet Other (See Comments)    Reaction: unknown  . Lisinopril Cough    Patient Measurements: Height: 5\' 6"  (167.6 cm) Weight: 203 lb 7.8 oz (92.3 kg) IBW/kg (Calculated) : 63.8 Heparin Dosing Weight: 80kg  Vital Signs: Temp: 98.7 F (37.1 C) (07/27 0200) Temp Source: Axillary (07/27 0200) BP: 93/69 (07/27 0605) Pulse Rate: 96 (07/27 0605)  Labs:  Recent Labs  11/21/15 1600 11/22/15 0550 11/22/15 1550 11/22/15 1932 11/23/15 0013 11/23/15 0500 11/24/15 0441  HGB 12.7* 13.4  --   --   --  12.8* 13.3  HCT 38.3* 38.2*  --   --   --  38.9* 39.3*  PLT 173 153  --   --   --  195 215  HEPARINUNFRC 0.25* 0.76* 0.57  --  0.36  --  0.35  CREATININE 5.79*  --   --   --   --  5.81* 4.57*  TROPONINI 6.56*  --   --  2.36*  --   --   --     Estimated Creatinine Clearance: 11.2 mL/min (by C-G formula based on SCr of 4.57 mg/dL).   Medical History: Past Medical History:  Diagnosis Date  . Arthritis   . Bronchitis   . Chronic kidney disease   . Coronary artery disease   . Diabetes mellitus without complication (HCC)   . GERD (gastroesophageal reflux disease)   . Gout   . Hypercholesterolemia   . Hyperlipidemia   . Hypertension   . Hypothyroidism   . Obesity   . Shingles   . Sleep apnea    Assessment: 82 yo male with chest pain and elevated troponin. Pharmacy consulted for heparin drip for ACS/Chest pain    Goal of Therapy:  Heparin level 0.3-0.7 units/ml Monitor platelets by anticoagulation protocol: Yes   Plan:  Heparin level= 0.57. Will continue heparin infusion at 1250 units/hr and check a confirmatory level in 8 hours.    7/26 00:15 heparin level 0.36. Recheck with CBC tomorrow AM.  7/27 AM heparin level 0.35. Continue current regimen. Recheck with CBC tomorrow AM.  Luisa Hart, PharmD Clinical Pharmacist   11/24/2015 6:52 AM

## 2015-11-24 NOTE — Progress Notes (Signed)
Name:           [name]. MRN:             [id number] DOB:             April 12, 1923                     ADMISSION DATE:  11/20/2015 CONSULTATION DATE:11/22/15    DISCUSSION: 83 year old male  With H/o bronchitis, chronic kidney disease, coronary artery disease, MI, diabetes mellitus, GERD, hyperlipidemia, hypertension, sleep apnea, Now admitted with NSTEMI, and  hypotensive exhibiting  tachyrhythmia requiring amiodarone infusion and fluid bolus.  ASSESSMENT / PLAN:  PULMONARY A: Hx of OSA P:   -Supplemental O2 as needed for dyspnea or maintain O2 sats >92%  CARDIOVASCULAR A:  NSTEMI Atrial fibrillation Hypotension  Hx Hypertension, CAD, and Hyperlipidemia Echo results 11/24/15; EF 55% P:  Amiodarone gtt Continue aspirin and plavix Prn levophed gtt to maintain systolic 90 or above  Start Midodrine today 7/27 Cardiology consulted appreciate input  RENAL A:   ESRD Hyponatremia-resolved P:   Dialysed on 11/21/15, plans for dialysis 7/28 Nephrology consulted appreciate input Replace electrolytes per ICU protocol  GASTROINTESTINAL A:   Hx of GERD P:   Carb modified/Renal diet Continue Pepcid  HEMATOLOGIC A:   No active issues P:  Subq Heparin for VTE prophylaxis Monitor for s/sx of bleeding Transfuse if hemoglobin <7  INFECTIOUS A:   Leukocytosis P:   Trend WBC and monitor fever curve Trend PCT's will start antibiotics if elevated  CULTURES: None  ANTIBIOTICS: None  ENDOCRINE A:   Diabetes mellitus Hypothyroidism Secondary hyperparathyroidism P:   Blood sugar checks before meals and at bedtime Sliding scale insulin coverage Lantus at bedtime Continue Synthroid  NEUROLOGIC A:   No active issues P:   RASS goal: 0 Minimize sedating medication  SIGNIFICANT EVENTS: 7/23 Patient admitted to ICU for NSTEMI  LINES/TUBES: 11/22/15 right femoral central line>>  STUDIES:  7/24 ECHO>>Aortic valve stenosis, EF of 50  -55%  CULTURES: None  ANTIBIOTICS: None  SUBJECTIVE:  Pt awake no acute distress or complaints  PHYSICAL EXAMINATION: General:  Elderly male, no acute distress Neuro: Awake, alert, oriented, follows command, no focal deficit HEENT: Atraumatic, normocephalic, supple, no JVD Cardiovascular: ST, no M/R/G noted Lungs:  rhonchi diminished towards bases, no wheezes Abdomen:  Obese, round, active bowel sounds x4 Musculoskeletal:  normal bulk and tone Skin:  Grossly intact  Imaging Dg Chest Port 1 View  Result Date: 11/21/2015 CLINICAL DATA:  Chest pain. EXAM: PORTABLE CHEST 1 VIEW COMPARISON:  11/02/2015 FINDINGS: Single lead cardiac pacemaker is stable. There has been an interval placement of right internal jugular approach central venous catheter with tip within the expected location of distal superior vena cava. The cardiac silhouette is enlarged. Mediastinal contours appear intact. Atherosclerotic disease and tortuosity of the aorta are noted. There is no evidence of focal airspace consolidation, pleural effusion or pneumothorax. Mild pulmonary vascular congestion. Osseous structures are without acute abnormality. Soft tissues are grossly normal. IMPRESSION: Enlarged cardiac silhouette. Atherosclerotic disease of the aorta. Mild pulmonary vascular congestion. Electronically Signed   By: Ted Mcalpine M.D.   On: 11/21/2015 20:38  Sonda Rumble, Juel Burrow  Pulmonary/Critical Care Pager [phone] (please enter 7 digits) PCCM Consult Pager [phone] (please enter 7 digits)   Pt. Seen and examined with NP; agree with assessment and plan; pt awake and alert, lungs CTA bilaterally, persistent hypotension. Will wean and down levophed as tolerated,  start midodrine.   [name], M.D.  11/24/2015   Critical Care Attestation.  I have personally obtained a history, examined the patient, evaluated laboratory and imaging results, formulated the assessment and plan and placed  orders. The Patient requires high complexity decision making for assessment and support, frequent evaluation and titration of therapies, application of advanced monitoring technologies and extensive interpretation of multiple databases. The patient has critical illness that could lead imminently to failure of 1 or more organ systems and requires the highest level of physician preparedness to intervene.  Critical Care Time devoted to patient care services described in this note is 35 minutes and is exclusive of time spent in procedures.

## 2015-11-24 NOTE — Progress Notes (Addendum)
[employer] [facility] - [place] at [facility]   PATIENT NAME: [name]    MR#:  [id number]  DATE OF BIRTH:  1922-08-05  SUBJECTIVE:  No acute events overnight. Patient reports no chest pain or shortness of breath. Patient is still on levofed and amiodarone drip.   REVIEW OF SYSTEMS:    Review of Systems  Constitutional: Negative.  Negative for chills, fever and malaise/fatigue.  HENT: Negative.  Negative for ear discharge, ear pain, hearing loss, nosebleeds and sore throat.   Eyes: Negative.  Negative for blurred vision and pain.  Respiratory: Negative.  Negative for cough, hemoptysis, shortness of breath and wheezing.   Cardiovascular: Negative.  Negative for chest pain, palpitations and leg swelling.  Gastrointestinal: Negative.  Negative for abdominal pain, blood in stool, diarrhea, nausea and vomiting.  Genitourinary: Negative.  Negative for dysuria.  Musculoskeletal: Negative.  Negative for back pain.  Skin: Negative.   Neurological: Negative for dizziness, tremors, speech change, focal weakness, seizures and headaches.  Endo/Heme/Allergies: Negative.  Does not bruise/bleed easily.  Psychiatric/Behavioral: Positive for memory loss. Negative for depression, hallucinations and suicidal ideas.    Tolerating Diet: yes      DRUG ALLERGIES:   Allergies  Allergen Reactions  . Cinacalcet Other (See Comments)    Reaction: unknown  . Lisinopril Cough    VITALS:  Blood pressure (!) 118/52, pulse (!) 102, temperature 97.8 F (36.6 C), temperature source Oral, resp. rate 20, height  (1.676 m), weight 92.3 kg (203 lb 7.8 oz), SpO2 94 %.  PHYSICAL EXAMINATION:   Physical Exam  Constitutional: He is well-developed, well-nourished, and in no distress. No distress.  HENT:  Head: Normocephalic.  Eyes: No scleral icterus.  Neck: Normal range of motion. Neck supple. No JVD present. No tracheal deviation present.  Cardiovascular: Normal rate and regular rhythm.  Exam  reveals no gallop and no friction rub.   Murmur heard. Pulmonary/Chest: Effort normal and breath sounds normal. No respiratory distress. He has no wheezes. He has no rales. He exhibits no tenderness.  Abdominal: Soft. Bowel sounds are normal. He exhibits no distension and no mass. There is no tenderness. There is no rebound and no guarding.  Musculoskeletal: Normal range of motion. He exhibits no edema.  Neurological: He is alert.  Skin: Skin is warm. No rash noted. No erythema.      LABORATORY PANEL:   CBC  Recent Labs Lab 11/24/15 0441  WBC 16.4*  HGB 13.3  HCT 39.3*  PLT 215   ------------------------------------------------------------------------------------------------------------------  Chemistries   Recent Labs Lab 11/23/15 1202 11/24/15 0441  NA  --  138  K  --  3.7  CL  --  92*  CO2  --  28  GLUCOSE  --  138*  BUN  --  18  CREATININE  --  4.57*  CALCIUM  --  9.5  MG  --  1.7  AST 23  --   ALT 13*  --   ALKPHOS 50  --   BILITOT 0.4  --    ------------------------------------------------------------------------------------------------------------------  Cardiac Enzymes  Recent Labs Lab 11/21/15 0047 11/21/15 1600 11/22/15 1932  TROPONINI 7.07* 6.56* 2.36*   ------------------------------------------------------------------------------------------------------------------  RADIOLOGY:  No results found.   ASSESSMENT AND PLAN:   82 year old male with ESRD on hemodialysis, anemia of chronic disease, diabetes and CAD who presented with chest pain and found to have non-STEMI.   1. Non-ST elevation MI: Troponin max of 7.07, trending down. Can stop heparin drip today.  Continue  aspirin, Plavix and statin Echo shows EF 50-55% with hypokinesis of apical myocardium and moderate to severe aortic valve stenosis. Patient not a candidate for cardiac catheterization, fairly high risk as per cardiology. Continue aggressive medical management.. Continue  nitroglycerin/morphine for pain.  2. Tachycardia arrhythmia, likely atrial fibrillation with RVR: Likely can wean amiodarone drip to oral amiodarone.   TSH and LFT are within normal limits 3. ESRD and hemodialysis: Continue hemodialysis Monday, Wednesday and Friday.  4. Diabetes: Continue sliding scale insulin and Lantus/ADA diet A1c reported at 7.4 5. BPH: Continue finasteride. 6. Aortic valve stenosis: Patient not candidate for valve replacement. 7. Hypothyroidism: Continue Synthroid.  8. Delirium: This is related to ICU delirium as well as current medical issues. Symptoms have improved. 9. Hypotension, multifactorial due to tachycardia from atrial fibrillation and hypertensive medications. Does not appear to be cardiogenic shock.  Hold hypertensive medications. Wean levophed to keep MAP>65       CODE STATUS: DNR  CRITICAL CARE TOTAL TIME TAKING CARE OF THIS PATIENT: 29 minutes.   With ongoing need for pressors high risk of cardiac decompensation  POSSIBLE D/C ??, DEPENDING ON CLINICAL CONDITION.   [name] M.D on 11/24/2015 at 9:48 AM  Between 7am to 6pm - Pager - [phone] After 6pm go to [email or web address] - password [employer]  [facility]  Office  [phone]  CC: Primary care physician; Marisue Ivan, MD  Note: This dictation was prepared with Dragon dictation along with smaller phrase technology. Any transcriptional errors that result from this process are unintentional.

## 2015-11-24 NOTE — Plan of Care (Signed)
Problem: SLP Dysphagia Goals Goal: Misc Dysphagia Goal Pt will safely tolerate po diet of least restrictive consistency w/ no overt s/s of aspiration noted by Staff/pt/family x3 sessions.    

## 2015-11-24 NOTE — Evaluation (Signed)
Clinical/Bedside Swallow Evaluation Patient Details  Name: Johnathan LIAW Sr. MRN: [id number] Date of Birth: 1922-12-28  Today's Date: 11/24/2015 Time: SLP Start Time (ACUTE ONLY): 1200 SLP Stop Time (ACUTE ONLY): 1300 SLP Time Calculation (min) (ACUTE ONLY): 60 min  Past Medical History:  Past Medical History:  Diagnosis Date  . Arthritis   . Bronchitis   . Chronic kidney disease   . Coronary artery disease   . Diabetes mellitus without complication (HCC)   . GERD (gastroesophageal reflux disease)   . Gout   . Hypercholesterolemia   . Hyperlipidemia   . Hypertension   . Hypothyroidism   . Obesity   . Shingles   . Sleep apnea    Past Surgical History:  Past Surgical History:  Procedure Laterality Date  . CHOLECYSTECTOMY    . EYE SURGERY    . HERNIA REPAIR    . PERIPHERAL VASCULAR CATHETERIZATION Left 01/25/2015   Procedure: A/V Shuntogram/Fistulagram;  Surgeon: Renford Dills, MD;  Location: [facility] INVASIVE CV LAB;  Service: Cardiovascular;  Laterality: Left;  . PERIPHERAL VASCULAR CATHETERIZATION N/A 01/25/2015   Procedure: A/V Shunt Intervention;  Surgeon: Renford Dills, MD;  Location: [facility] INVASIVE CV LAB;  Service: Cardiovascular;  Laterality: N/A;   HPI:  Pt patient with past medical history of coronary artery disease and end-stage renal disease on dialysis, Obesity, HTN, DM, shingles, GERD, hiatal hernia per previous CXR in chart notes, bronchitis who presents emergency department complaining of chest pain. He states that he's had multiple episodes of chest pain over the last 4 days but usually resolved with Tums. However, tonight he took Tums in the evening and went to bed but was awakened by chest pain that radiated laterally across his chest. He denies shortness of breath, nausea, vomiting or diaphoresis. After transport to the emergency department he was found to have elevated troponin. EKG and telemetry were unchanged but the patient continued to have nagging chest  pressure 2 out of 10 in severity after aspiring 324 mg and nitroglycerin spray which prompted the emergency department staff to call for admission.    Assessment / Plan / Recommendation Clinical Impression  Pt appears to adequately tolerate trials of thin liquids and puree/soft solids following general aspiration precautions and given feeding support and cues. Pt consumed po trials w/ no overt s/s of aspiration noted during intake; min oral phase deficits noted c/b lengthier mastication time/effort  - suspect d/t edentulous status. Pt responded well to smaller trials, moistened well and when given time to masticate. Also instructed pt to use lingual sweep and dry swallow to clear well; alternate w/ small sips of liquids intermittently. Pt required support w/ holding cup for drinking and overall feeding d/t weakness and fatigue. Edcuated on need for rest breaks as needed to avoid increased WOB. Brief education also given on general Reflux precautions; pt and family may benefit from further f/u in this education while admitted. Recommend initiation of a Dysphagia 3-type diet w/ meats cut and moistened well; thin liquids - NO straws if coughing noted when using. Recommend Meds in puree - whole as able. Support at all meals.     Aspiration Risk   (reduced following general precautions)    Diet Recommendation  mech soft foods - moistened, soft; thin liquids. General aspiration precautions; Reflux precautions.   Medication Administration: Whole meds with puree if easier for swallowing   Other  Recommendations Recommended Consults: Consider GI evaluation (f/u may be beneficial) Oral Care Recommendations: Oral care BID;Staff/trained caregiver  to provide oral care   Follow up Recommendations   (TBD)    Frequency and Duration min 2x/week  1 week       Prognosis Prognosis for Safe Diet Advancement: Fair (-Good) Barriers to Reach Goals:  (baseline GI issues)      Swallow Study   General Date of  Onset: 11/20/15 HPI: Pt patient with past medical history of coronary artery disease and end-stage renal disease on dialysis, Obesity, HTN, DM, shingles, GERD, hiatal hernia per previous CXR in chart notes, bronchitis who presents emergency department complaining of chest pain. He states that he's had multiple episodes of chest pain over the last 4 days but usually resolved with Tums. However, tonight he took Tums in the evening and went to bed but was awakened by chest pain that radiated laterally across his chest. He denies shortness of breath, nausea, vomiting or diaphoresis. After transport to the emergency department he was found to have elevated troponin. EKG and telemetry were unchanged but the patient continued to have nagging chest pressure 2 out of 10 in severity after aspiring 324 mg and nitroglycerin spray which prompted the emergency department staff to call for admission.  Type of Study: Bedside Swallow Evaluation Previous Swallow Assessment: Barium Swallow (GI) Diet Prior to this Study: Regular;Thin liquids (at home; in chart currently) Temperature Spikes Noted: No (wbc elevated) Respiratory Status: Nasal cannula (3 liters) History of Recent Intubation: No Behavior/Cognition: Alert;Cooperative;Pleasant mood;Requires cueing Oral Cavity Assessment: Within Functional Limits Oral Care Completed by SLP: Recent completion by staff Oral Cavity - Dentition: Edentulous (baseline) Vision: Functional for self-feeding Self-Feeding Abilities: Able to feed self;Needs assist;Needs set up;Total assist (overall weakness) Patient Positioning: Upright in bed Baseline Vocal Quality: Normal Volitional Cough: Strong Volitional Swallow: Able to elicit    Oral/Motor/Sensory Function Overall Oral Motor/Sensory Function: Within functional limits   Ice Chips Ice chips: Within functional limits Presentation: Spoon (fed; 3 trials)   Thin Liquid Thin Liquid: Within functional limits Presentation: Cup;Self  Fed (w/ holding assistance by SLP; 8 trials)    Nectar Thick Nectar Thick Liquid: Not tested   Honey Thick Honey Thick Liquid: Not tested   Puree Puree: Within functional limits Presentation: Spoon (fed; 5 trials)   Solid   GO   Solid: Impaired Presentation: Spoon (fed; 3 trials - food moistened well, softened) Oral Phase Impairments: Impaired mastication Oral Phase Functional Implications:  (min increased mastication effort/time) Pharyngeal Phase Impairments:  (none) Other Comments: suspect d/t edentulous status and overall weakness       Jerilynn Som, MS, CCC-SLP  Watson,Katherine 11/24/2015,4:27 PM

## 2015-11-25 ENCOUNTER — Inpatient Hospital Stay: Payer: Medicare Other

## 2015-11-25 LAB — CBC
HCT: 41.2 % (ref 40.0–52.0)
HEMOGLOBIN: 13.7 g/dL (ref 13.0–18.0)
MCH: 35.1 pg — AB (ref 26.0–34.0)
MCHC: 33.2 g/dL (ref 32.0–36.0)
MCV: 105.6 fL — ABNORMAL HIGH (ref 80.0–100.0)
PLATELETS: 253 10*3/uL (ref 150–440)
RBC: 3.9 MIL/uL — ABNORMAL LOW (ref 4.40–5.90)
RDW: 14.9 % — ABNORMAL HIGH (ref 11.5–14.5)
WBC: 15.5 10*3/uL — ABNORMAL HIGH (ref 3.8–10.6)

## 2015-11-25 LAB — LACTIC ACID, PLASMA
LACTIC ACID, VENOUS: 3.4 mmol/L — AB (ref 0.5–1.9)
Lactic Acid, Venous: 4.1 mmol/L (ref 0.5–1.9)

## 2015-11-25 LAB — MAGNESIUM: MAGNESIUM: 1.7 mg/dL (ref 1.7–2.4)

## 2015-11-25 LAB — ACTH STIMULATION, 3 TIME POINTS
CORTISOL 60 MIN: 25.2 ug/dL
Cortisol, 30 Min: 28.4 ug/dL
Cortisol, Base: 22.7 ug/dL

## 2015-11-25 LAB — GLUCOSE, CAPILLARY
GLUCOSE-CAPILLARY: 189 mg/dL — AB (ref 65–99)
GLUCOSE-CAPILLARY: 189 mg/dL — AB (ref 65–99)
GLUCOSE-CAPILLARY: 176 mg/dL — AB (ref 65–99)
Glucose-Capillary: 270 mg/dL — ABNORMAL HIGH (ref 65–99)

## 2015-11-25 LAB — BASIC METABOLIC PANEL
Anion gap: 17 — ABNORMAL HIGH (ref 5–15)
BUN: 31 mg/dL — AB (ref 6–20)
CHLORIDE: 90 mmol/L — AB (ref 101–111)
CO2: 28 mmol/L (ref 22–32)
Calcium: 10.2 mg/dL (ref 8.9–10.3)
Creatinine, Ser: 6.59 mg/dL — ABNORMAL HIGH (ref 0.61–1.24)
GFR calc non Af Amer: 7 mL/min — ABNORMAL LOW (ref >60)
GFR, EST AFRICAN AMERICAN: 8 mL/min — AB (ref >60)
GLUCOSE: 199 mg/dL — AB (ref 65–99)
Potassium: 3.8 mmol/L (ref 3.5–5.1)
SODIUM: 135 mmol/L (ref 135–145)

## 2015-11-25 LAB — PROCALCITONIN: Procalcitonin: 1.45 ng/mL

## 2015-11-25 LAB — PHOSPHORUS: PHOSPHORUS: 4.8 mg/dL — AB (ref 2.5–4.6)

## 2015-11-25 LAB — ALBUMIN: ALBUMIN: 2.8 g/dL — AB (ref 3.5–5.0)

## 2015-11-25 MED ORDER — PIPERACILLIN-TAZOBACTAM 3.375 G IVPB
3.3750 g | Freq: Two times a day (BID) | INTRAVENOUS | Status: DC
  Administered 2015-11-26 – 2015-11-28 (×5): 3.375 g via INTRAVENOUS
  Filled 2015-11-25 (×8): qty 50

## 2015-11-25 MED ORDER — METOPROLOL TARTRATE 5 MG/5ML IV SOLN
2.5000 mg | Freq: Once | INTRAVENOUS | Status: AC
  Administered 2015-11-25: 2.5 mg via INTRAVENOUS
  Filled 2015-11-25: qty 5

## 2015-11-25 MED ORDER — NEPRO/CARBSTEADY PO LIQD
237.0000 mL | Freq: Two times a day (BID) | ORAL | Status: DC
Start: 2015-11-25 — End: 2015-11-29
  Administered 2015-11-26 – 2015-11-28 (×6): 237 mL via ORAL

## 2015-11-25 MED ORDER — AMIODARONE HCL 200 MG PO TABS
200.0000 mg | ORAL_TABLET | Freq: Two times a day (BID) | ORAL | Status: DC
  Administered 2015-11-25 – 2015-11-28 (×7): 200 mg via ORAL
  Filled 2015-11-25 (×7): qty 1

## 2015-11-25 MED ORDER — IPRATROPIUM-ALBUTEROL 0.5-2.5 (3) MG/3ML IN SOLN
3.0000 mL | Freq: Four times a day (QID) | RESPIRATORY_TRACT | Status: DC
  Administered 2015-11-25: 3 mL via RESPIRATORY_TRACT
  Filled 2015-11-25: qty 3

## 2015-11-25 MED ORDER — DEXTROSE 5 % IV SOLN
75.0000 mg | Freq: Once | INTRAVENOUS | Status: AC
  Administered 2015-11-25: 75 mg via INTRAVENOUS
  Filled 2015-11-25: qty 1.5

## 2015-11-25 MED ORDER — IPRATROPIUM-ALBUTEROL 0.5-2.5 (3) MG/3ML IN SOLN
3.0000 mL | Freq: Four times a day (QID) | RESPIRATORY_TRACT | Status: DC | PRN
  Administered 2015-11-25 – 2015-11-26 (×2): 3 mL via RESPIRATORY_TRACT
  Filled 2015-11-25: qty 3

## 2015-11-25 MED ORDER — METOPROLOL TARTRATE 5 MG/5ML IV SOLN
5.0000 mg | Freq: Once | INTRAVENOUS | Status: AC
  Administered 2015-11-25: 5 mg via INTRAVENOUS
  Filled 2015-11-25: qty 5

## 2015-11-25 MED ORDER — METOPROLOL TARTRATE 5 MG/5ML IV SOLN
2.5000 mg | INTRAVENOUS | Status: DC | PRN
  Administered 2015-11-25: 2.5 mg via INTRAVENOUS
  Filled 2015-11-25: qty 5

## 2015-11-25 MED ORDER — HYDROCORTISONE NA SUCCINATE PF 100 MG IJ SOLR
50.0000 mg | Freq: Four times a day (QID) | INTRAMUSCULAR | Status: DC
  Administered 2015-11-25 – 2015-11-28 (×12): 50 mg via INTRAVENOUS
  Filled 2015-11-25 (×11): qty 2

## 2015-11-25 NOTE — Procedures (Signed)
11/25/2015   Procedure Note:  Arterial Line Placement AUBERY SAMEK Sr. , [id number] , [id number]  Indications:  Hemodynamic instability / recurrent ABG draws  Benefits, risks (including bleeding, infection,  Injury, etc.), and alternatives explained,  who voiced understanding.  Questions were sought and answered.  Family and pt. agreed to proceed with the procedure.  Consent is signed and on chart.    Allen's test performed to ensure adequate perfusion.  Time out was performed verifying correct patient, procedure, site, positioning, and special catheter was available at the time of procedure.  Patient's left femoral artery was prepped and draped in usual sterile fashion.  1 % Lidocaine was used to anesthetize the area.  Total number of attempts were 1.  A 20 gauge arterial line was introduced into the left femoral artery.  Catheter threaded and the needle was removed with appropriate blood return.  Blood loss was minimal.  Patient tolerated the procedure well, and there were no complications.     Shane Crutch, MD [facility] Pager [phone] On Call Pager [phone]

## 2015-11-25 NOTE — Progress Notes (Signed)
Post hd vitals 

## 2015-11-25 NOTE — Progress Notes (Signed)
Notified Dr. Juliann Pares that pt HR remains in the 120-130's. Ordered to give 2.5mg  of Metoprolol.

## 2015-11-25 NOTE — Progress Notes (Signed)
PT Cancellation Note  Patient Details Name: Johnathan GEMMEL Sr. MRN: [id number] DOB: Apr 22, 1923   Cancelled Treatment:    Reason Eval/Treat Not Completed: Medical issues which prohibited therapy (Consult received and chart reviewed.  Per primary RN, patient with audible wheezing this AM, just returned from dialysis and with tachyarrhythmia (125bpm).  Will hold at this time and re-attempt next date as appropraite.)  Floyed Masoud H. Manson Passey, PT, DPT, NCS 11/25/15, 2:58 PM [phone]

## 2015-11-25 NOTE — Procedures (Deleted)
Arterial Catheter Insertion Procedure Note Johnathan MARCHEWKA Sr. [id number] [birth year]  Procedure: Insertion of Arterial Catheter  Indications: Blood pressure monitoring  Procedure Details Consent: Risks of procedure as well as the alternatives and risks of each were explained to the (patient/caregiver).  Consent for procedure obtained. Time Out: Verified patient identification, verified procedure, site/side was marked, verified correct patient position, special equipment/implants available, medications/allergies/relevent history reviewed, required imaging and test results available.  Performed  Maximum sterile technique was used including antiseptics, cap, gloves, gown, hand hygiene, mask and sheet. Skin prep: Chlorhexidine; local anesthetic administered 20 gauge catheter was inserted into left femoral artery using the Seldinger technique.  Evaluation Blood flow good; BP tracing good. Complications: No apparent complications.  Supervising Physician Dr. Ignacia Marvel 11/25/2015

## 2015-11-25 NOTE — Progress Notes (Signed)
Post hd assessment 

## 2015-11-25 NOTE — Progress Notes (Signed)
Advance care planning discussion.   Had a discussion with patient in presence of wife and daughter, discussed pt's currently living situation, and functional status and whether he would like to return to this. He felt that he was happy with his current situation at home, though it appears he requires a lot of assistance with ADL's and rarely leaves the home.  We discussed the likelihood that he may not be able to return home as his wife may no longer be able to take care of him. He did not seem to be able to process this, asking repeatedly about whether he will be able to have surgery for the neuropathy in his hand.  Confirmed code status with family and desire for continued measures to try to get him better, therefore we decided to proceed with current measures and will attempt to place an art line to better manage BP.  Recommend further palliative care discussions to better broach goals of care.  Total time spent 30 min.   [name], M.D.  11/25/2015

## 2015-11-25 NOTE — Progress Notes (Signed)
Speech Language Pathology Treatment: Dysphagia  Patient Details Name: Johnathan PALMESE Sr. MRN: [id number] DOB: 1922/06/06 Today's Date: 11/25/2015 Time: 4035-2481 SLP Time Calculation (min) (ACUTE ONLY): 37 min  Assessment / Plan / Recommendation Clinical Impression  Pt seen for toleration of po's/oral diet this morning. Pt is currently receiving dialysis and po trials were limited. NSG also giving meds (in puree Whole). Pt's HR was elevated; NSG addressing w/ MD ongoing. Pt appeared to tolerate the trials of puree, and Pills in puree, w/ no overt s/s of aspiration noted; similar noted w/ few trials of thin liquids. Pt consumed the thin liquids via straw but no immediate, overt s/s of aspiration during intake; clear vocal quality noted b/t trials. Pt was instructed on aspiration precautions including taking rest breaks to avoid any increased WOB especially w/ increased HR at this time. Also recommended full positioning of pt upright for easier breathing and digestion. Pt appears at min increased risk for aspiration d/t declined medical status overall. Dietician consult placed for nutritional support.    HPI HPI: Pt patient with past medical history of coronary artery disease and end-stage renal disease on dialysis, Obesity, HTN, DM, shingles, GERD, hiatal hernia per previous CXR in chart notes, bronchitis who presents emergency department complaining of chest pain. He states that he's had multiple episodes of chest pain over the last 4 days but usually resolved with Tums. However, tonight he took Tums in the evening and went to bed but was awakened by chest pain that radiated laterally across his chest. He denies shortness of breath, nausea, vomiting or diaphoresis. After transport to the emergency department he was found to have elevated troponin. EKG and telemetry were unchanged but the patient continued to have nagging chest pressure 2 out of 10 in severity after aspiring 324 mg and nitroglycerin spray  which prompted the emergency department staff to call for admission.       SLP Plan  Continue with current plan of care     Recommendations  Diet recommendations: Dysphagia 3 (mechanical soft);Thin liquid Liquids provided via: Cup;Straw (if not coughing noted during straw use) Medication Administration: Whole meds with puree Supervision: Staff to assist with self feeding;Full supervision/cueing for compensatory strategies Compensations: Minimize environmental distractions;Slow rate;Small sips/bites;Lingual sweep for clearance of pocketing;Follow solids with liquid Postural Changes and/or Swallow Maneuvers: Seated upright 90 degrees;Upright 30-60 min after meal             General recommendations: Other(comment) (Dietician f/u) Oral Care Recommendations: Oral care BID;Staff/trained caregiver to provide oral care Follow up Recommendations:  (TBD) Plan: Continue with current plan of care     GO               Jerilynn Som, MS, CCC-SLP  [name] 11/25/2015, 11:49 AM

## 2015-11-25 NOTE — Progress Notes (Addendum)
Name:           Johnathan BOU Sr. MRN:             [id number] DOB:             [birth year]                     ADMISSION DATE:  11/20/2015 CONSULTATION DATE:11/22/15    DISCUSSION: 82 year old male  With H/o bronchitis, chronic kidney disease, coronary artery disease, MI, diabetes mellitus, GERD, hyperlipidemia, hypertension, sleep apnea, Now admitted with NSTEMI, and  hypotensive exhibiting  tachyrhythmia requiring amiodarone infusion and fluid bolus.  ASSESSMENT / PLAN:  PULMONARY A: Hx of OSA P:   -Supplemental O2 as needed for dyspnea or maintain O2 sats >92% -Start prn Duonebs q6hrs -Pulmonary Hygiene  CARDIOVASCULAR A:  NSTEMI Atrial fibrillation Hypotension  Hx Hypertension, CAD, and Hyperlipidemia Echo results 11/24/15; EF 55% P:  Amiodarone gtt per cardiology will convert to po amiodarone and discontinue amiodarone gtt Continue aspirin and plavix Prn levophed gtt to maintain systolic 90 or above  Start Midodrine today 7/27 Cardiology consulted appreciate input  RENAL A:   ESRD Hyponatremia-resolved P:   Dialysed on 11/21/15, plans for dialysis 7/28 Nephrology consulted appreciate input Replace electrolytes per ICU protocol  GASTROINTESTINAL A:   Hx of GERD P:   Carb modified/Renal diet Continue Pepcid  HEMATOLOGIC A:   No active issues P:  Subq Heparin for VTE prophylaxis Monitor for s/sx of bleeding Transfuse if hemoglobin <7  INFECTIOUS A:   Leukocytosis P:   Trend WBC and monitor fever curve  ENDOCRINE A:   Diabetes mellitus Hypothyroidism Secondary hyperparathyroidism P:   Blood sugar checks before meals and at bedtime Sliding scale insulin coverage Lantus at bedtime Continue Synthroid  NEUROLOGIC A:   No active issues P:   RASS goal: 0 Minimize sedating medication  SIGNIFICANT EVENTS: 7/23 Patient admitted to ICU for NSTEMI  LINES/TUBES: 11/22/15 right femoral central line>>  STUDIES:  7/24 ECHO>>Aortic valve  stenosis, EF of 50 -55%  CULTURES: None  ANTIBIOTICS: None  SUBJECTIVE:  Pt awake no acute distress or complaints  PHYSICAL EXAMINATION: General:  Elderly male, no acute distress Neuro: Awake, alert, oriented, follows command, no focal deficit HEENT: Atraumatic, normocephalic, supple, no JVD Cardiovascular: ST, no M/R/G noted Lungs:  rhonchi and expiratory wheezes diminished towards bases Abdomen:  Obese, round, active bowel sounds x4 Musculoskeletal:  normal bulk and tone Skin:  Grossly intact  Imaging Dg Chest Port 1 View  Result Date: 11/21/2015 CLINICAL DATA:  Chest pain. EXAM: PORTABLE CHEST 1 VIEW COMPARISON:  11/02/2015 FINDINGS: Single lead cardiac pacemaker is stable. There has been an interval placement of right internal jugular approach central venous catheter with tip within the expected location of distal superior vena cava. The cardiac silhouette is enlarged. Mediastinal contours appear intact. Atherosclerotic disease and tortuosity of the aorta are noted. There is no evidence of focal airspace consolidation, pleural effusion or pneumothorax. Mild pulmonary vascular congestion. Osseous structures are without acute abnormality. Soft tissues are grossly normal. IMPRESSION: Enlarged cardiac silhouette. Atherosclerotic disease of the aorta. Mild pulmonary vascular congestion. Electronically Signed   By: Ted Mcalpine M.D.   On: 11/21/2015 20:38  Sonda Rumble, AGNP  Pulmonary/Critical Care Pager [phone] (please enter 7 digits) PCCM Consult Pager [phone] (please enter 7 digits)   Pt seen and examined with NP, agree with assessment and plan. NSTEMI, pt appears awake and alert, though very fatigued. Lung CTA,  bilaterally. ACTH stim test equivocal, will start empiric steroids. Sepsis appears doubtful. Wean levophed to SBP > 90. May need placement of art line if pt will consent.  Cards recs noted, wean off amio drip.    [name], M.D.   11/25/2015   Critical Care Attestation.  I have personally obtained a history, examined the patient, evaluated laboratory and imaging results, formulated the assessment and plan and placed orders. The Patient requires high complexity decision making for assessment and support, frequent evaluation and titration of therapies, application of advanced monitoring technologies and extensive interpretation of multiple databases. The patient has critical illness that could lead imminently to failure of 1 or more organ systems and requires the highest level of physician preparedness to intervene.  Critical Care Time devoted to patient care services described in this note is 45 minutes and is exclusive of time spent in procedures.

## 2015-11-25 NOTE — Progress Notes (Signed)
PRE DIALYSIS ASSESSMENT 

## 2015-11-25 NOTE — Progress Notes (Signed)
Notified Dr. Lady Gary of pt HR in the 120's A.fib, during dialysis. Ordered to give 75 mg bolus of Amiodarone. Medication given. Will continue to monitor.

## 2015-11-25 NOTE — Progress Notes (Signed)
Initial Nutrition Assessment  DOCUMENTATION CODES:   Obesity unspecified  INTERVENTION:  -Recommend Nepro Shake po BID, each supplement provides 425 kcal and 19 grams protein. If pt does not like Nepro, can consider changing to Ensure or another option -Based on dietary recall, pt does not appear to consume many sources of protein at home. Discussed importance of protein with regards to dialysis, reviewed sources of protein and encouraged pt to use protein shake or equivalent at home  NUTRITION DIAGNOSIS:   Inadequate oral intake related to poor appetite as evidenced by per patient/family report.  GOAL:   Patient will meet greater than or equal to 90% of their needs  MONITOR:   PO intake, Supplement acceptance, Weight trends, Labs  REASON FOR ASSESSMENT:   Consult Poor PO  ASSESSMENT:   82 yo male admitted with NSTEMI; pt with hx of ESRD on HD  ECHO shows EF of 50-55% with moderate to severe aortic valve stenosis, not candidate for cardiac cath  Pt currently on low dose pressors, receiving dialysis today.  Pt reports appetite is fair, reports he usually eats 2 meals per day. Pt is edentulous, has dentures but usually does not wear. Pt has difficulty chewing due to this, does not eat salads, most meats. Pt reports his wife is not well enough to cook much anymore but he does receive meals on wheels. Pt reports he does not like some of the food they provide which attributes to some of his poor intake. Pt does report that his wife does usually cook him eggs and sausage gravy biscuits daily, which he does enjoy. Pt does not use nutritional supplements. Pt does not know his dry weight, he believes he has lost weight but does not know how much  Nutrition-Focused physical exam completed. Findings are no fat depletion, no muscle depletion, and mild/moderate edema.   Past Medical History:  Diagnosis Date  . Arthritis   . Bronchitis   . Chronic kidney disease   . Coronary artery  disease   . Diabetes mellitus without complication (HCC)   . GERD (gastroesophageal reflux disease)   . Gout   . Hypercholesterolemia   . Hyperlipidemia   . Hypertension   . Hypothyroidism   . Obesity   . Shingles   . Sleep apnea      Diet Order:  Diet renal/carb modified with fluid restriction Diet-HS Snack? Nothing; Room service appropriate? Yes with Assist; Fluid consistency: Thin; Fluid restriction: 1200 mL Fluid  Energy Intake: recorded po intake 50% of meals on average  Skin:  Reviewed, no issues  Last BM:  7/23   Labs: phosphorus wdl, potassium wdl  Glucose Profile:   Recent Labs  11/24/15 2119 11/25/15 0720 11/25/15 1134  GLUCAP 159* 189* 189*   Meds: phoslo, ss novolog, lantus  Height:   Ht Readings from Last 1 Encounters:  11/22/15 5\' 6"  (1.676 m)    Weight: no weight loss per weight encounters, although unsure if these weights are dry weights  Wt Readings from Last 1 Encounters:  11/25/15 205 lb 4 oz (93.1 kg)    Filed Weights   11/24/15 0450 11/25/15 0500 11/25/15 1005  Weight: 203 lb 7.8 oz (92.3 kg) 206 lb 5.6 oz (93.6 kg) 205 lb 4 oz (93.1 kg)   Wt Readings from Last 10 Encounters:  11/25/15 205 lb 4 oz (93.1 kg)  11/02/15 194 lb 0.1 oz (88 kg)  01/25/15 193 lb (87.5 kg)     BMI:  Body mass index is  33.13 kg/m.  Estimated Nutritional Needs:   Kcal:  2000-2300 kcals   Protein:  78-98 g   Fluid:  1000 mL plus UOP  EDUCATION NEEDS:   Education needs addressed  Romelle Starcher MS, RD, LDN [phone] Pager  [phone] Weekend/On-Call Pager

## 2015-11-25 NOTE — Progress Notes (Signed)
End of hd tx  

## 2015-11-25 NOTE — Progress Notes (Signed)
[employer] [facility] [place] [facility]   PATIENT NAME: [name]    MR#:  [id number]  DATE OF BIRTH:  [birth year]  SUBJECTIVE:   Patient is having dialysis in his room. He denies chest pain.  Still with hypotension  REVIEW OF SYSTEMS:    Review of Systems  Constitutional: Positive for malaise/fatigue. Negative for chills and fever.  HENT: Negative.  Negative for ear discharge, ear pain, hearing loss, nosebleeds and sore throat.   Eyes: Negative.  Negative for blurred vision and pain.  Respiratory: Negative.  Negative for cough, hemoptysis, shortness of breath and wheezing.   Cardiovascular: Positive for leg swelling. Negative for chest pain and palpitations.  Gastrointestinal: Negative.  Negative for abdominal pain, blood in stool, diarrhea, nausea and vomiting.  Genitourinary: Negative.  Negative for dysuria.  Musculoskeletal: Negative.  Negative for back pain.  Skin: Negative.   Neurological: Negative for dizziness, tremors, speech change, focal weakness, seizures and headaches.  Endo/Heme/Allergies: Negative.  Does not bruise/bleed easily.  Psychiatric/Behavioral: Positive for memory loss. Negative for depression, hallucinations and suicidal ideas.    Tolerating Diet: yes      DRUG ALLERGIES:   Allergies  Allergen Reactions  . Cinacalcet Other (See Comments)    Reaction: unknown  . Lisinopril Cough    VITALS:  Blood pressure 109/61, pulse (!) 123, temperature 97.6 F (36.4 C), temperature source Oral, resp. rate 20, height  (1.676 m), weight 93.1 kg (205 lb 4 oz), SpO2 94 %.  PHYSICAL EXAMINATION:   Physical Exam  Constitutional: He is well-developed, well-nourished, and in no distress. No distress.  HENT:  Head: Normocephalic.  Eyes: No scleral icterus.  Neck: Normal range of motion. Neck supple. No JVD present. No tracheal deviation present.  Cardiovascular: Normal rate and regular rhythm.  Exam reveals no gallop and no friction rub.    Murmur heard. Pulmonary/Chest: Effort normal and breath sounds normal. No respiratory distress. He has no wheezes. He has no rales. He exhibits no tenderness.  Abdominal: Soft. Bowel sounds are normal. He exhibits no distension and no mass. There is no tenderness. There is no rebound and no guarding.  Musculoskeletal: Normal range of motion. He exhibits edema.  3+ dependent edema  Neurological: He is alert.  Skin: Skin is warm. No rash noted. No erythema.      LABORATORY PANEL:   CBC  Recent Labs Lab 11/25/15 0604  WBC 15.5*  HGB 13.7  HCT 41.2  PLT 253   ------------------------------------------------------------------------------------------------------------------  Chemistries   Recent Labs Lab 11/23/15 1202  11/25/15 0604  NA  --   < > 135  K  --   < > 3.8  CL  --   < > 90*  CO2  --   < > 28  GLUCOSE  --   < > 199*  BUN  --   < > 31*  CREATININE  --   < > 6.59*  CALCIUM  --   < > 10.2  MG  --   < > 1.7  AST 23  --   --   ALT 13*  --   --   ALKPHOS 50  --   --   BILITOT 0.4  --   --   < > = values in this interval not displayed. ------------------------------------------------------------------------------------------------------------------  Cardiac Enzymes  Recent Labs Lab 11/21/15 0047 11/21/15 1600 11/22/15 1932  TROPONINI 7.07* 6.56* 2.36*   ------------------------------------------------------------------------------------------------------------------  RADIOLOGY:  Dg Chest Port 1 View  Result Date:  11/25/2015 CLINICAL DATA:  Chest pain. EXAM: PORTABLE CHEST 1 VIEW COMPARISON:  11/21/2015. FINDINGS: Cardiac pacer with lead tip projected over the right heart in stable position. Cardiomegaly with bilateral basilar pulmonary infiltrates consistent pulmonary edema. Small bilateral pleural effusions. No pneumothorax. IMPRESSION: 1. Cardiac pacer stable position. 2. Cardiomegaly with bilateral pulmonary infiltrates consistent with pulmonary edema.  Bilateral small pleural effusions. These findings are consistent congestive heart failure. Bibasilar pneumonia cannot be excluded . Electronically Signed   By: Maisie Fus  Register   On: 11/25/2015 06:39    ASSESSMENT AND PLAN:   82 year old male with ESRD on hemodialysis, anemia of chronic disease, diabetes and CAD who presented with chest pain and found to have non-STEMI.   1. Non-ST elevation MI: Troponin max of 7.07, trending down. Heparin drip stopped yesterday. Continue medical management with aspirin, Plavix and statin. Due to hypotension beta blocker not initiated.  Echo shows EF 50-55% with hypokinesis of apical myocardium and moderate to severe aortic valve stenosis. Patient not a candidate for cardiac catheterization, fairly high risk as per cardiology.  Appreciate cardiology consult. 2. Tachycardia arrhythmia, with intermittent atrial fibrillation with RVR: Attempting to convert to oral amiodarone today. We'll not be a candidate for chronic anticoagulation due to bleeding risk. TSH and LFT are within normal limits 3. ESRD and hemodialysis: Continue hemodialysis Monday, Wednesday and Friday. Appreciate nephrology consult. 4. Diabetes: Continue sliding scale insulin and Lantus/ADA diet A1c reported at 7.4 5. BPH: Continue finasteride. 6. Aortic valve stenosis: Patient not candidate for valve replacement. 7. Hypothyroidism: Continue Synthroid.  8. Delirium: This is related to ICU delirium as well as current medical issues. Symptoms have improved. 9. Hypotension, multifactorial due to tachycardia from atrial fibrillation and hypertensive medications. Does not appear to be cardiogenic shock.  Hold hypertensive medications. Wean levophed to keep MAP>65  10. Anasarca: Likely due to immobility. He would benefit from PT consult today or at least sitting him up in a chair. I will check albumin as well.     CODE STATUS: DNR  CRITICAL CARE TOTAL TIME TAKING CARE OF THIS  PATIENT: 24 minutes.   With ongoing need for pressors for hypotension and tachycardia arrhythmia high risk of cardiac decompensation  POSSIBLE D/C ??, DEPENDING ON CLINICAL CONDITION.   Haliegh Khurana M.D on 11/25/2015 at 11:40 AM  Between 7am to 6pm - Pager - [phone] After 6pm go to [email or web address] - password [employer]  [facility]  Office  [phone]  CC: Primary care physician; Marisue Ivan, MD  Note: This dictation was prepared with Dragon dictation along with smaller phrase technology. Any transcriptional errors that result from this process are unintentional.

## 2015-11-25 NOTE — Progress Notes (Signed)
[facility]  SUBJECTIVE: Somewhat confused. No complaints of chest pain. Denies sob.    Vitals:   11/25/15 0300 11/25/15 0400 11/25/15 0500 11/25/15 0600  BP: (!) 118/41 (!) 108/40 (!) 75/49 (!) 106/52  Pulse: 88 82 (!) 59 (!) 132  Resp: (!) 7 13 14  (!) 21  Temp:      TempSrc:      SpO2: 92% 96% 94% 96%  Weight:   93.6 kg (206 lb 5.6 oz)   Height:        Intake/Output Summary (Last 24 hours) at 11/25/15 0751 Last data filed at 11/25/15 0200  Gross per 24 hour  Intake            434.2 ml  Output                0 ml  Net            434.2 ml    LABS: Basic Metabolic Panel:  Recent Labs  46/96/29 1520 11/24/15 0441 11/25/15 0604  NA  --  138 135  K  --  3.7 3.8  CL  --  92* 90*  CO2  --  28 28  GLUCOSE  --  138* 199*  BUN  --  18 31*  CREATININE  --  4.57* 6.59*  CALCIUM  --  9.5 10.2  MG  --  1.7 1.7  PHOS 4.0  --   --    Liver Function Tests:  Recent Labs  11/23/15 1202  AST 23  ALT 13*  ALKPHOS 50  BILITOT 0.4  PROT 6.0*  ALBUMIN 2.7*   No results for input(s): LIPASE, AMYLASE in the last 72 hours. CBC:  Recent Labs  11/24/15 0441 11/25/15 0604  WBC 16.4* 15.5*  HGB 13.3 13.7  HCT 39.3* 41.2  MCV 105.2* 105.6*  PLT 215 253   Cardiac Enzymes:  Recent Labs  11/22/15 1932  TROPONINI 2.36*   BNP: Invalid input(s): POCBNP D-Dimer: No results for input(s): DDIMER in the last 72 hours. Hemoglobin A1C: No results for input(s): HGBA1C in the last 72 hours. Fasting Lipid Panel: No results for input(s): CHOL, HDL, LDLCALC, TRIG, CHOLHDL, LDLDIRECT in the last 72 hours. Thyroid Function Tests:  Recent Labs  11/23/15 1202  TSH 1.702   Anemia Panel: No results for input(s): VITAMINB12, FOLATE, FERRITIN, TIBC, IRON, RETICCTPCT in the last 72 hours.   Physical Exam: Blood pressure (!) 106/52, pulse (!) 132, temperature 98.6 F (37 C), temperature source Axillary, resp. rate (!) 21, height 5\' 6"  (1.676  m), weight 93.6 kg (206 lb 5.6 oz), SpO2 96 %.   Wt Readings from Last 1 Encounters:  11/25/15 93.6 kg (206 lb 5.6 oz)     General appearance: cooperative and slowed mentation Head: Normocephalic, without obvious abnormality, atraumatic Resp: rales bibasilar Cardio: irregularly irregular rhythm GI: soft, non-tender; bowel sounds normal; no masses,  no organomegaly Extremities: edema 2+ Neurologic: Mental status: orientation: person, place  TELEMETRY: Reviewed telemetry pt in intermittant afib with rvr, afib with controlled rate and sinus rhythm:  ASSESSMENT AND PLAN:  Principal Problem:   NSTEMI (non-ST elevated myocardial infarction) (HCC)-ruled in for nstemi. Will continue to treat medically. Not an ideal candidate for invasive evaluation due to comorbidities. Active Problems:   Atypical chest pain-no chest pain at present. Will continue with current meds   ESRD on hemodialysis (HCC)-for hd today. Anuric    Tachyarrhythmia-intermitant afib with rvr. WIll continue with amiodarone. Will attempt  to convert to po amiodarone. Will not be a candidate for chronic anticoagulation due to bleeding risk.    Arterial hypotension-will attempt to wean levophed to keep map greater than 60 and sbp greater than 90    Shaquandra Galano A., MD, [facility] 11/25/2015 7:51 AM

## 2015-11-25 NOTE — Progress Notes (Signed)
DSr. Juliann Pares is on call for [facility] and will be covering my patients.

## 2015-11-25 NOTE — Progress Notes (Signed)
Paged Dr. Juliann Pares to inform him of HR in the 130's Afib. Still on Levophed drip. Ordered to continue to give metoprolol PRN for a HR greater than 120. Annabelle Harman, NP made aware of Lactic acid of 3.4. Started on Zosyn. Pt is afebrile. Arterial line placed on left femoraL to monitor BP.

## 2015-11-25 NOTE — Progress Notes (Signed)
Pharmacy Antibiotic Note  Johnathan CARRELL Sr. is a 82 y.o. male admitted on 11/20/2015 with chest pain and NSTEMI. Pharmacy now consulted for Zosyn dosing for aspiration pneumonia.   Plan: Will start the patient on Zosyn 3.375 Iv EI every 12 hours based on patients crcl <70ml/min.  Height: 5\' 6"  (167.6 cm) Weight: 205 lb 4 oz (93.1 kg) IBW/kg (Calculated) : 63.8  Temp (24hrs), Avg:98.2 F (36.8 C), Min:97.6 F (36.4 C), Max:98.8 F (37.1 C)   Recent Labs Lab 11/20/15 0906 11/21/15 1600 11/22/15 0550 11/23/15 0500 11/24/15 0441 11/25/15 0604 11/25/15 1656  WBC 9.5 13.3* 10.8* 14.9* 16.4* 15.5*  --   CREATININE 6.73* 5.79*  --  5.81* 4.57* 6.59*  --   LATICACIDVEN  --   --   --   --   --   --  3.4*    Estimated Creatinine Clearance: 7.8 mL/min (by C-G formula based on SCr of 6.59 mg/dL).    Allergies  Allergen Reactions  . Cinacalcet Other (See Comments)    Reaction: unknown  . Lisinopril Cough    Antimicrobials this admission: 11/25/15 Zosyn >>   Microbiology results: 07/28 BCx: 7/28 Sputum:  7/23 MRSA PCR: negative  Thank you for allowing pharmacy to be a part of this patient's care.  Cher Nakai, PharmD Clinical Pharmacist 11/25/2015 6:05 PM

## 2015-11-25 NOTE — Progress Notes (Signed)
[facility]  ROUNDING NOTE   Subjective:  Patient resting comfortably in bed. Brother at bedside. Remains on low-dose pressors at the moment. Eating breakfast at the moment. He is due for hemodialysis today.  Objective:  Vital signs in last 24 hours:  Temp:  [97.7 F (36.5 C)-99.5 F (37.5 C)] 97.7 F (36.5 C) (07/28 0700) Pulse Rate:  [59-132] 114 (07/28 0700) Resp:  [7-27] 27 (07/28 0700) BP: (75-140)/(28-80) 99/57 (07/28 0700) SpO2:  [92 %-96 %] 96 % (07/28 0700) Weight:  [93.6 kg (206 lb 5.6 oz)] 93.6 kg (206 lb 5.6 oz) (07/28 0500)  Weight change: 1.3 kg (2 lb 13.9 oz) Filed Weights   11/23/15 1855 11/24/15 0450 11/25/15 0500  Weight: 90.8 kg (200 lb 2.8 oz) 92.3 kg (203 lb 7.8 oz) 93.6 kg (206 lb 5.6 oz)    Intake/Output: I/O last 3 completed shifts: In: 654.6 [I.V.:654.6] Out: -    Intake/Output this shift:  No intake/output data recorded.  Physical Exam: General: NAD, resting in bed  Head: Normocephalic, atraumatic. Moist oral mucosal membranes  Eyes: Anicteric  Neck: Supple, trachea midline  Lungs:  Clear to auscultation bilateral, normal effort   Heart: Irregular, crescendo systolic murmur  Abdomen:  Soft, nontender, BS present  Extremities: trace peripheral edema.  Neurologic: Nonfocal, moving all four extremities  Skin: No lesions  Access: LUE AVG, good thrill    Basic Metabolic Panel:  Recent Labs Lab 11/20/15 0906 11/21/15 1600 11/23/15 0500 11/23/15 1520 11/24/15 0441 11/25/15 0604  NA 137 133* 132*  --  138 135  K 3.6 3.9 4.0  --  3.7 3.8  CL 96* 93* 91*  --  92* 90*  CO2 --  28 28  GLUCOSE 260* 146* 156*  --  138* 199*  BUN 30* 27* 28*  --  18 31*  CREATININE 6.73* 5.79* 5.81*  --  4.57* 6.59*  CALCIUM 10.1 9.2 9.2  --  9.5 10.2  MG  --   --   --   --  1.7 1.7  PHOS  --  2.4*  --  4.0  --   --     Liver Function Tests:  Recent Labs Lab 11/21/15 1600 11/23/15 1202  AST 44* 23  ALT 14* 13*  ALKPHOS  48 50  BILITOT 0.5 0.4  PROT 6.0* 6.0*  ALBUMIN 2.9* 2.7*   No results for input(s): LIPASE, AMYLASE in the last 168 hours. No results for input(s): AMMONIA in the last 168 hours.  CBC:  Recent Labs Lab 11/21/15 1600 11/22/15 0550 11/23/15 0500 11/24/15 0441 11/25/15 0604  WBC 13.3* 10.8* 14.9* 16.4* 15.5*  HGB 12.7* 13.4 12.8* 13.3 13.7  HCT 38.3* 38.2* 38.9* 39.3* 41.2  MCV 103.9* 104.6* 105.8* 105.2* 105.6*  PLT 173 153 195 215 253    Cardiac Enzymes:  Recent Labs Lab 11/20/15 1250 11/20/15 1849 11/21/15 0047 11/21/15 1600 11/22/15 1932  TROPONINI 0.30* 3.11* 7.07* 6.56* 2.36*    BNP: Invalid input(s): POCBNP  CBG:  Recent Labs Lab 11/24/15 0722 11/24/15 1121 11/24/15 1631 11/24/15 2119 11/25/15 0720  GLUCAP 140* 193* 167* 159* 189*    Microbiology: Results for orders placed or performed during the hospital encounter of 11/20/15  MRSA PCR Screening     Status: None   Collection Time: 11/20/15  4:45 PM  Result Value Ref Range Status   MRSA by PCR NEGATIVE NEGATIVE Final    Comment:        The  GeneXpert MRSA Assay (FDA approved for NASAL specimens only), is one component of a comprehensive MRSA colonization surveillance program. It is not intended to diagnose MRSA infection nor to guide or monitor treatment for MRSA infections.     Coagulation Studies: No results for input(s): LABPROT, INR in the last 72 hours.  Urinalysis: No results for input(s): COLORURINE, LABSPEC, PHURINE, GLUCOSEU, HGBUR, BILIRUBINUR, KETONESUR, PROTEINUR, UROBILINOGEN, NITRITE, LEUKOCYTESUR in the last 72 hours.  Invalid input(s): APPERANCEUR    Imaging: Dg Chest Port 1 View  Result Date: 11/25/2015 CLINICAL DATA:  Chest pain. EXAM: PORTABLE CHEST 1 VIEW COMPARISON:  11/21/2015. FINDINGS: Cardiac pacer with lead tip projected over the right heart in stable position. Cardiomegaly with bilateral basilar pulmonary infiltrates consistent pulmonary edema. Small  bilateral pleural effusions. No pneumothorax. IMPRESSION: 1. Cardiac pacer stable position. 2. Cardiomegaly with bilateral pulmonary infiltrates consistent with pulmonary edema. Bilateral small pleural effusions. These findings are consistent congestive heart failure. Bibasilar pneumonia cannot be excluded . Electronically Signed   By: Maisie Fus  Register   On: 11/25/2015 06:39    Medications:   . norepinephrine (LEVOPHED) Adult infusion 32 mcg/min (11/25/15 0756)   . allopurinol  100 mg Oral Daily  . amiodarone  200 mg Oral BID  . aspirin EC  81 mg Oral Daily  . calcium acetate (Phos Binder)  1,334 mg Oral TID WC  . clopidogrel  75 mg Oral Daily  . docusate sodium  100 mg Oral BID  . finasteride  5 mg Oral Daily  . fluticasone  2 spray Each Nare Daily  . gabapentin  100 mg Oral QHS  . heparin subcutaneous  5,000 Units Subcutaneous Q8H  . insulin aspart  0-9 Units Subcutaneous TID WC  . insulin glargine  10 Units Subcutaneous QHS  . levothyroxine  100 mcg Oral QAC breakfast  . loratadine  10 mg Oral Daily  . midodrine  10 mg Oral TID WC  . pantoprazole  40 mg Oral Daily  . simvastatin  20 mg Oral q1800  . sodium chloride flush  3 mL Intravenous Q12H   sodium chloride, acetaminophen **OR** acetaminophen, bisacodyl, calcium acetate, hydroxypropyl methylcellulose / hypromellose, lidocaine-prilocaine, morphine injection, nitroGLYCERIN, ondansetron **OR** ondansetron (ZOFRAN) IV, oxyCODONE-acetaminophen, sodium chloride flush  Assessment/ Plan:  82 y.o. male on hemodialysis Monday, Wednesday, Friday followed by [facility] nephrology, anemia of chronic kidney disease, secondary hyperparathyroid is him, hypertension, hyperlipidemia, diabetes mellitus type 2, hypothyroidism, coronary artery disease, sleep apnea, GERD admitted for chest pain.  [facility] nephrology/MWF/[address]   1. End-stage renal disease on dialysis MWF.  Continue dialysis on MWF schedule. - Patient due for hemodialysis today. Orders  have been prepared. Ultrafiltration target 1-1.5 kg today.  2. Anemia of chronic kidney disease. Hemoglobin remains elevated at 13.7. Therefore we will continue to avoid Epogen..  3. Secondary hyperparathyroidism. Follow-up serum phosphorus today.  Continue PhosLo 2 tablets by mouth 3 times a day with meals.  4. Chest pain: As per cardiology. Patient currently on amiodarone drip.  LOS: 5 Mildreth Reek 7/28/20179:23 AM

## 2015-11-25 NOTE — Progress Notes (Signed)
HD STARTED  

## 2015-11-26 ENCOUNTER — Inpatient Hospital Stay: Payer: Medicare Other

## 2015-11-26 LAB — BASIC METABOLIC PANEL
ANION GAP: 20 — AB (ref 5–15)
BUN: 27 mg/dL — ABNORMAL HIGH (ref 6–20)
CALCIUM: 9.9 mg/dL (ref 8.9–10.3)
CHLORIDE: 89 mmol/L — AB (ref 101–111)
CO2: 26 mmol/L (ref 22–32)
Creatinine, Ser: 5.01 mg/dL — ABNORMAL HIGH (ref 0.61–1.24)
GFR calc non Af Amer: 9 mL/min — ABNORMAL LOW (ref >60)
GFR, EST AFRICAN AMERICAN: 10 mL/min — AB (ref >60)
Glucose, Bld: 294 mg/dL — ABNORMAL HIGH (ref 65–99)
Potassium: 4.1 mmol/L (ref 3.5–5.1)
SODIUM: 135 mmol/L (ref 135–145)

## 2015-11-26 LAB — CBC WITH DIFFERENTIAL/PLATELET
BASOS PCT: 0 %
Basophils Absolute: 0 10*3/uL (ref 0–0.1)
EOS ABS: 0 10*3/uL (ref 0–0.7)
Eosinophils Relative: 0 %
HEMATOCRIT: 40.5 % (ref 40.0–52.0)
HEMOGLOBIN: 13.8 g/dL (ref 13.0–18.0)
LYMPHS ABS: 0.6 10*3/uL — AB (ref 1.0–3.6)
Lymphocytes Relative: 4 %
MCH: 35.4 pg — ABNORMAL HIGH (ref 26.0–34.0)
MCHC: 34.1 g/dL (ref 32.0–36.0)
MCV: 104 fL — ABNORMAL HIGH (ref 80.0–100.0)
MONOS PCT: 8 %
Monocytes Absolute: 1.2 10*3/uL — ABNORMAL HIGH (ref 0.2–1.0)
NEUTROS ABS: 14.1 10*3/uL — AB (ref 1.4–6.5)
NEUTROS PCT: 88 %
Platelets: 227 10*3/uL (ref 150–440)
RBC: 3.89 MIL/uL — AB (ref 4.40–5.90)
RDW: 15.4 % — ABNORMAL HIGH (ref 11.5–14.5)
WBC: 16 10*3/uL — AB (ref 3.8–10.6)

## 2015-11-26 LAB — GLUCOSE, CAPILLARY
GLUCOSE-CAPILLARY: 311 mg/dL — AB (ref 65–99)
GLUCOSE-CAPILLARY: 275 mg/dL — AB (ref 65–99)
GLUCOSE-CAPILLARY: 236 mg/dL — AB (ref 65–99)
Glucose-Capillary: 273 mg/dL — ABNORMAL HIGH (ref 65–99)

## 2015-11-26 LAB — LACTIC ACID, PLASMA
LACTIC ACID, VENOUS: 5 mmol/L — AB (ref 0.5–1.9)
Lactic Acid, Venous: 3.3 mmol/L (ref 0.5–1.9)

## 2015-11-26 LAB — PROCALCITONIN: Procalcitonin: 4.12 ng/mL

## 2015-11-26 LAB — MAGNESIUM: Magnesium: 1.9 mg/dL (ref 1.7–2.4)

## 2015-11-26 MED ORDER — DIGOXIN 0.25 MG/ML IJ SOLN
0.2500 mg | Freq: Once | INTRAMUSCULAR | Status: AC
  Administered 2015-11-26: 0.25 mg via INTRAVENOUS
  Filled 2015-11-26: qty 2

## 2015-11-26 MED ORDER — DIGOXIN 0.25 MG/ML IJ SOLN
0.5000 mg | Freq: Once | INTRAMUSCULAR | Status: AC
  Administered 2015-11-26: 0.5 mg via INTRAVENOUS
  Filled 2015-11-26: qty 2

## 2015-11-26 MED ORDER — DIGOXIN 0.25 MG/ML IJ SOLN
0.1250 mg | INTRAMUSCULAR | Status: DC
  Filled 2015-11-26: qty 2

## 2015-11-26 MED ORDER — METOPROLOL TARTRATE 25 MG PO TABS
25.0000 mg | ORAL_TABLET | Freq: Four times a day (QID) | ORAL | Status: DC
  Administered 2015-11-26 – 2015-11-27 (×2): 25 mg via ORAL
  Filled 2015-11-26 (×2): qty 1

## 2015-11-26 NOTE — Progress Notes (Signed)
Pt is alert and oriented to self. No c/o pain. Respirations are even and unlabored. Lung sounds are rhonchus to auscultation. Weak, non-productive cough. Pt remains on Levophed gtt-currently at 12 mcgs. ST in 120s on cardiac monitor-Dr Callwood recently placed orders for digoxin.  Family has been at bedside. Family and patient spoke with Dr Ardyth Man today regarding pt outcome. Pt verbalized his understanding and wishes to proceed tomorrow with Levo gtt removal.

## 2015-11-26 NOTE — Progress Notes (Signed)
Name:           Johnathan ARSENEAU Sr. MRN:             [id number] DOB:             [birth year]                     ADMISSION DATE:  11/20/2015 CONSULTATION DATE:11/22/15    DISCUSSION: 82 year old male  With H/o bronchitis, chronic kidney disease, coronary artery disease, MI, diabetes mellitus, GERD, hyperlipidemia, hypertension, sleep apnea, Now admitted with NSTEMI, and  hypotension exhibiting  tachyrhythmia requiring amiodarone infusion.  ASSESSMENT / PLAN:  PULMONARY A: Hx of OSA P:   -Supplemental O2 as needed for dyspnea or maintain O2 sats >92% - prn Duonebs q6hrs -Pulmonary Hygiene  CARDIOVASCULAR A:  NSTEMI Atrial fibrillation-- persistent tachycardia, completed amio infusion, now on oral dosing.  Hypotension- persistent secondary to sepsis, afib.  Hx Hypertension, CAD, and Hyperlipidemia Echo results 11/24/15; EF 55% P:  Amiodarone PO Continue aspirin and plavix Prn levophed gtt to maintain systolic 90 or above  Continue Midodrine    RENAL A:   ESRD Hyponatremia-resolved P:   Dialysis per nephro dialysis 7/28   GASTROINTESTINAL A:   Hx of GERD P:   Carb modified/Renal diet Continue Pepcid  HEMATOLOGIC A:   No active issues P:  Subq Heparin for VTE prophylaxis Monitor for s/sx of bleeding Transfuse if hemoglobin <7  INFECTIOUS A:   Leukocytosis P:   Trend WBC and monitor fever curve  ENDOCRINE A:   Diabetes mellitus Hypothyroidism Secondary hyperparathyroidism P:   Blood sugar checks before meals and at bedtime Sliding scale insulin coverage Lantus at bedtime Continue Synthroid  NEUROLOGIC A:   No active issues P:   RASS goal: 0 Minimize sedating medication  SIGNIFICANT EVENTS: 7/23 Patient admitted to ICU for NSTEMI  LINES/TUBES: 11/22/15 right femoral central line>>  STUDIES:  7/24 ECHO>>Aortic valve stenosis, EF of 50 -55%  CULTURES: None  ANTIBIOTICS: None  SUBJECTIVE:  Pt awake no acute distress or  complaints  PHYSICAL EXAMINATION: General:  Elderly male, no acute distress, awake, alert.  Neuro: Awake, alert, oriented, follows command, no focal deficit HEENT: Atraumatic, normocephalic, supple, no JVD Cardiovascular: ST, no M/R/G noted Lungs:  rhonchi and expiratory wheezes diminished towards bases Abdomen:  Obese, round, active bowel sounds x4 Musculoskeletal:  normal bulk and tone Skin:  Grossly intact  Imaging Dg Chest Port 1 View  Result Date: 11/21/2015 CLINICAL DATA:  Chest pain. EXAM: PORTABLE CHEST 1 VIEW COMPARISON:  11/02/2015 FINDINGS: Single lead cardiac pacemaker is stable. There has been an interval placement of right internal jugular approach central venous catheter with tip within the expected location of distal superior vena cava. The cardiac silhouette is enlarged. Mediastinal contours appear intact. Atherosclerotic disease and tortuosity of the aorta are noted. There is no evidence of focal airspace consolidation, pleural effusion or pneumothorax. Mild pulmonary vascular congestion. Osseous structures are without acute abnormality. Soft tissues are grossly normal. IMPRESSION: Enlarged cardiac silhouette. Atherosclerotic disease of the aorta. Mild pulmonary vascular congestion. Electronically Signed   By: Ted Mcalpine M.D.   On: 11/21/2015 20:38    Advance care planning:  Discussed with patient and family member in room, pt's pressor requirements have improved but still requiring 20 mcg of levophed. Patient feels that he is "right with God" and is "ready". He reaffirmed current code status of DNR. He is getting tired of being in the hospital.  We plan to try to wean down/off the levophed today. If by tomorrow the levophed is still on, we plan to stop it and let nature take it's course.   [name], M.D.  30 min spent  In advance care planning. 11/26/2015

## 2015-11-26 NOTE — Progress Notes (Signed)
[employer] - [place] [facility]   PATIENT NAME: [name]    MR#:  [id number]  DATE OF BIRTH:  [birth year]  SUBJECTIVE:   Patient here due to a non-ST elevation MI, also noted to be in A. fib with RVR. Off amiodarone drip now. Also remains hypotensive and on a Levophed drip. Clinically asymptomatic. Family at bedside.  REVIEW OF SYSTEMS:    Review of Systems  Constitutional: Negative for chills and fever.  HENT: Negative for congestion and tinnitus.   Eyes: Negative for blurred vision and double vision.  Respiratory: Negative for cough, shortness of breath and wheezing.   Cardiovascular: Negative for chest pain, orthopnea and PND.  Gastrointestinal: Negative for abdominal pain, diarrhea, nausea and vomiting.  Genitourinary: Negative for dysuria and hematuria.  Neurological: Negative for dizziness, sensory change and focal weakness.  All other systems reviewed and are negative.   Nutrition: heart Healthy/Renal Tolerating Diet: Yes Tolerating PT: Await Eval.    DRUG ALLERGIES:   Allergies  Allergen Reactions  . Cinacalcet Other (See Comments)    Reaction: unknown  . Lisinopril Cough    VITALS:  Blood pressure (!) 103/56, pulse (!) 123, temperature 98.7 F (37.1 C), temperature source Oral, resp. rate 17, height 5\' 6"  (1.676 m), weight 89.6 kg (197 lb 8.5 oz), SpO2 100 %.  PHYSICAL EXAMINATION:   Physical Exam  GENERAL:  82 y.o.-year-old patient lying in bed in no acute distress.  EYES: Pupils equal, round, reactive to light and accommodation. No scleral icterus. Extraocular muscles intact.  HEENT: Head atraumatic, normocephalic. Oropharynx and nasopharynx clear.  NECK:  Supple, no jugular venous distention. No thyroid enlargement, no tenderness.  LUNGS: Normal breath sounds bilaterally, no wheezing, rales, rhonchi. No use of accessory muscles of respiration.  CARDIOVASCULAR: S1, S2 Tachy, irregular No murmurs, rubs, or gallops.  ABDOMEN: Soft,  nontender, nondistended. Bowel sounds present. No organomegaly or mass.  EXTREMITIES: No cyanosis, clubbing or edema b/l.    NEUROLOGIC: Cranial nerves II through XII are intact. No focal Motor or sensory deficits b/l.   PSYCHIATRIC: The patient is alert and oriented x 3.  SKIN: No obvious rash, lesion, or ulcer.   Left upper ext. AV fistula with good bruit, good thrill.    LABORATORY PANEL:   CBC  Recent Labs Lab 11/26/15 0600  WBC 16.0*  HGB 13.8  HCT 40.5  PLT 227   ------------------------------------------------------------------------------------------------------------------  Chemistries   Recent Labs Lab 11/23/15 1202  11/26/15 0600  NA  --   < > 135  K  --   < > 4.1  CL  --   < > 89*  CO2  --   < > 26  GLUCOSE  --   < > 294*  BUN  --   < > 27*  CREATININE  --   < > 5.01*  CALCIUM  --   < > 9.9  MG  --   < > 1.9  AST 23  --   --   ALT 13*  --   --   ALKPHOS 50  --   --   BILITOT 0.4  --   --   < > = values in this interval not displayed. ------------------------------------------------------------------------------------------------------------------  Cardiac Enzymes  Recent Labs Lab 11/22/15 1932  TROPONINI 2.36*   ------------------------------------------------------------------------------------------------------------------  RADIOLOGY:  Dg Chest Port 1 View  Result Date: 11/26/2015 CLINICAL DATA:  Acute respiratory failure EXAM: PORTABLE CHEST 1 VIEW COMPARISON:  November 25, 2015 FINDINGS: No pneumothorax.  Stable mild cardiomegaly. A layering effusion and underlying atelectasis on the left is stable. Stable haziness in the right perihilar region may represent layering effusion or developing infiltrate. Recommend attention on follow-up. No other interval changes or acute abnormalities. IMPRESSION: 1. Stable small effusion and atelectasis on the left. 2. Increasing focal haziness in the right perihilar region may represent layering effusion versus  developing infiltrate. Recommend attention on follow-up. 3. No other acute abnormalities. Electronically Signed   By: Gerome Sam III M.D   On: 11/26/2015 07:11  Dg Chest Port 1 View  Result Date: 11/25/2015 CLINICAL DATA:  Chest pain. EXAM: PORTABLE CHEST 1 VIEW COMPARISON:  11/21/2015. FINDINGS: Cardiac pacer with lead tip projected over the right heart in stable position. Cardiomegaly with bilateral basilar pulmonary infiltrates consistent pulmonary edema. Small bilateral pleural effusions. No pneumothorax. IMPRESSION: 1. Cardiac pacer stable position. 2. Cardiomegaly with bilateral pulmonary infiltrates consistent with pulmonary edema. Bilateral small pleural effusions. These findings are consistent congestive heart failure. Bibasilar pneumonia cannot be excluded . Electronically Signed   By: Maisie Fus  Register   On: 11/25/2015 06:39    ASSESSMENT AND PLAN:   82 year old male with ESRD on hemodialysis, anemia of chronic disease, diabetes and CAD who presented with chest pain and found to have non-STEMI.  1. Non-ST elevation MI-patient's troponin maxed out at 7.0. -Treated conservatively, heparin was stopped yesterday. Currently asymptomatic. -Continue aspirin, Plavix, statin, cont. Low dose B-blocker.    2. Atrial fibrillation with RVR-was on amiodarone drip without much improvement. -Not taken off the amiodarone drip. On oral amiodarone. Rates are still labile. We'll start on low-dose beta blocker and monitor. -Appreciate cardiology input.  3. End-stage renal disease on hemodialysis-continue dialysis Monday Wednesday Friday. Nephrology following. No acute indication dialysis presently.  4. Diabetes type 2 with renal, patient is-continue sliding scale insulin, Lantus, follow blood sugars which are stable.  5. BPH-continue finasteride.  6. Hypothyroidism-continue Synthroid.  7. Hypotension-multifactorial in nature related to underlying A. fib with RVR, no clinical evidence of  sepsis. -Continue IV Levophed to keep mean arterial pressure greater than 60. Wean as tolerated. -Discussed with intensivist and plan for probably stopping Levophed tomorrow and follow hemodynamics.  8. Delirium-multifactorial nature much improved now. Await physical therapy evaluation.   All the records are reviewed and case discussed with Care Management/Social Workerr. Management plans discussed with the patient, family and they are in agreement.  CODE STATUS: DO NOT RESUSCITATE  DVT Prophylaxis: Heparin subcutaneous  TOTAL TIME TAKING CARE OF THIS PATIENT: 30 minutes.   POSSIBLE D/C IN 2-3 DAYS, DEPENDING ON CLINICAL CONDITION.   Houston Siren M.D on 11/26/2015 at 12:49 PM  Between 7am to 6pm - Pager - [phone]  After 6pm go to [email or web address] - password [email or web address] [facility]  [place]  Office  [phone]  CC: Primary care physician; Marisue Ivan, MD

## 2015-11-26 NOTE — Progress Notes (Signed)
Pharmacy Antibiotic Note  Johnathan KOPACK Sr. is a 82 y.o. male admitted on 11/20/2015 with chest pain and NSTEMI. Pharmacy now consulted for Zosyn dosing for aspiration pneumonia in hemodialysis patient.   Plan: Will continue the patient on Zosyn 3.375 Iv EI every 12 hours for Hemodialysis dosing.  Height: 5\' 6"  (167.6 cm) Weight: 197 lb 8.5 oz (89.6 kg) IBW/kg (Calculated) : 63.8  Temp (24hrs), Avg:98.1 F (36.7 C), Min:97.6 F (36.4 C), Max:98.7 F (37.1 C)   Recent Labs Lab 11/21/15 1600 11/22/15 0550 11/23/15 0500 11/24/15 0441 11/25/15 0604 11/25/15 1656 11/25/15 2203 11/26/15 0055 11/26/15 0600  WBC 13.3* 10.8* 14.9* 16.4* 15.5*  --   --   --  16.0*  CREATININE 5.79*  --  5.81* 4.57* 6.59*  --   --   --  5.01*  LATICACIDVEN  --   --   --   --   --  3.4* 4.1* 5.0* 3.3*    Estimated Creatinine Clearance: 10.1 mL/min (by C-G formula based on SCr of 5.01 mg/dL).    Allergies  Allergen Reactions  . Cinacalcet Other (See Comments)    Reaction: unknown  . Lisinopril Cough    Antimicrobials this admission: 11/25/15 Zosyn >>   Microbiology results: 07/28 BCx: NG x 24hr 7/28 Sputum:  7/23 MRSA PCR: negative  Thank you for allowing pharmacy to be a part of this patient's care . Bari Mantis PharmD Clinical Pharmacist 11/26/2015

## 2015-11-26 NOTE — Progress Notes (Signed)
[facility]  ROUNDING NOTE   Subjective:  Patient resting comfortably in bed this a.m. He completed hemodialysis yesterday. Remains on pressors this a.m.  Objective:  Vital signs in last 24 hours:  Temp:  [97.6 F (36.4 C)-97.9 F (36.6 C)] 97.9 F (36.6 C) (07/29 0200) Pulse Rate:  [66-128] 123 (07/29 0600) Resp:  [14-28] 17 (07/29 0600) BP: (69-128)/(18-112) 103/56 (07/29 0400) SpO2:  [92 %-100 %] 100 % (07/29 0600) Arterial Line BP: (94-137)/(45-82) 94/51 (07/29 0600) Weight:  [89.6 kg (197 lb 8.5 oz)-93.1 kg (205 lb 4 oz)] 89.6 kg (197 lb 8.5 oz) (07/29 0500)  Weight change: -0.5 kg (-1 lb 1.6 oz) Filed Weights   11/25/15 0500 11/25/15 1005 11/26/15 0500  Weight: 93.6 kg (206 lb 5.6 oz) 93.1 kg (205 lb 4 oz) 89.6 kg (197 lb 8.5 oz)    Intake/Output: I/O last 3 completed shifts: In: 554.2 [P.O.:120; I.V.:434.2] Out: 1700 [Other:1700]   Intake/Output this shift:  No intake/output data recorded.  Physical Exam: General: NAD, resting in bed  Head: Normocephalic, atraumatic. Moist oral mucosal membranes  Eyes: Anicteric  Neck: Supple, trachea midline  Lungs:  Clear to auscultation bilateral, normal effort   Heart: Irregular, crescendo systolic murmur  Abdomen:  Soft, nontender, BS present  Extremities: trace peripheral edema.  Neurologic: Nonfocal, moving all four extremities  Skin: No lesions  Access: LUE AVG, good thrill    Basic Metabolic Panel:  Recent Labs Lab 11/21/15 1600 11/23/15 0500 11/23/15 1520 11/24/15 0441 11/25/15 0604 11/26/15 0600  NA 133* 132*  --  138 135 135  K 3.9 4.0  --  3.7 3.8 4.1  CL 93* 91*  --  92* 90* 89*  CO2 25 29  --  28 28 26   GLUCOSE 146* 156*  --  138* 199* 294*  BUN 27* 28*  --  18 31* 27*  CREATININE 5.79* 5.81*  --  4.57* 6.59* 5.01*  CALCIUM 9.2 9.2  --  9.5 10.2 9.9  MG  --   --   --  1.7 1.7 1.9  PHOS 2.4*  --  4.0  --  4.8*  --     Liver Function Tests:  Recent Labs Lab 11/21/15 1600  11/23/15 1202 11/25/15 1656  AST 44* 23  --   ALT 14* 13*  --   ALKPHOS 48 50  --   BILITOT 0.5 0.4  --   PROT 6.0* 6.0*  --   ALBUMIN 2.9* 2.7* 2.8*   No results for input(s): LIPASE, AMYLASE in the last 168 hours. No results for input(s): AMMONIA in the last 168 hours.  CBC:  Recent Labs Lab 11/22/15 0550 11/23/15 0500 11/24/15 0441 11/25/15 0604 11/26/15 0600  WBC 10.8* 14.9* 16.4* 15.5* 16.0*  NEUTROABS  --   --   --   --  14.1*  HGB 13.4 12.8* 13.3 13.7 13.8  HCT 38.2* 38.9* 39.3* 41.2 40.5  MCV 104.6* 105.8* 105.2* 105.6* 104.0*  PLT 153 195 215 253 227    Cardiac Enzymes:  Recent Labs Lab 11/20/15 1250 11/20/15 1849 11/21/15 0047 11/21/15 1600 11/22/15 1932  TROPONINI 0.30* 3.11* 7.07* 6.56* 2.36*    BNP: Invalid input(s): POCBNP  CBG:  Recent Labs Lab 11/25/15 0720 11/25/15 1134 11/25/15 1627 11/25/15 2209 11/26/15 0723  GLUCAP 189* 189* 176* 270* 273*    Microbiology: Results for orders placed or performed during the hospital encounter of 11/20/15  MRSA PCR Screening     Status: None  Collection Time: 11/20/15  4:45 PM  Result Value Ref Range Status   MRSA by PCR NEGATIVE NEGATIVE Final    Comment:        The GeneXpert MRSA Assay (FDA approved for NASAL specimens only), is one component of a comprehensive MRSA colonization surveillance program. It is not intended to diagnose MRSA infection nor to guide or monitor treatment for MRSA infections.     Coagulation Studies: No results for input(s): LABPROT, INR in the last 72 hours.  Urinalysis: No results for input(s): COLORURINE, LABSPEC, PHURINE, GLUCOSEU, HGBUR, BILIRUBINUR, KETONESUR, PROTEINUR, UROBILINOGEN, NITRITE, LEUKOCYTESUR in the last 72 hours.  Invalid input(s): APPERANCEUR    Imaging: Dg Chest Port 1 View  Result Date: 11/26/2015 CLINICAL DATA:  Acute respiratory failure EXAM: PORTABLE CHEST 1 VIEW COMPARISON:  November 25, 2015 FINDINGS: No pneumothorax. Stable  mild cardiomegaly. A layering effusion and underlying atelectasis on the left is stable. Stable haziness in the right perihilar region may represent layering effusion or developing infiltrate. Recommend attention on follow-up. No other interval changes or acute abnormalities. IMPRESSION: 1. Stable small effusion and atelectasis on the left. 2. Increasing focal haziness in the right perihilar region may represent layering effusion versus developing infiltrate. Recommend attention on follow-up. 3. No other acute abnormalities. Electronically Signed   By: Gerome Sam III M.D   On: 11/26/2015 07:11  Dg Chest Port 1 View  Result Date: 11/25/2015 CLINICAL DATA:  Chest pain. EXAM: PORTABLE CHEST 1 VIEW COMPARISON:  11/21/2015. FINDINGS: Cardiac pacer with lead tip projected over the right heart in stable position. Cardiomegaly with bilateral basilar pulmonary infiltrates consistent pulmonary edema. Small bilateral pleural effusions. No pneumothorax. IMPRESSION: 1. Cardiac pacer stable position. 2. Cardiomegaly with bilateral pulmonary infiltrates consistent with pulmonary edema. Bilateral small pleural effusions. These findings are consistent congestive heart failure. Bibasilar pneumonia cannot be excluded . Electronically Signed   By: Maisie Fus  Register   On: 11/25/2015 06:39    Medications:   . norepinephrine (LEVOPHED) Adult infusion 22 mcg/min (11/26/15 0630)   . allopurinol  100 mg Oral Daily  . amiodarone  200 mg Oral BID  . aspirin EC  81 mg Oral Daily  . calcium acetate (Phos Binder)  1,334 mg Oral TID WC  . clopidogrel  75 mg Oral Daily  . docusate sodium  100 mg Oral BID  . feeding supplement (NEPRO CARB STEADY)  237 mL Oral BID BM  . finasteride  5 mg Oral Daily  . fluticasone  2 spray Each Nare Daily  . gabapentin  100 mg Oral QHS  . heparin subcutaneous  5,000 Units Subcutaneous Q8H  . hydrocortisone sod succinate (SOLU-CORTEF) inj  50 mg Intravenous Q6H  . insulin aspart  0-9 Units  Subcutaneous TID WC  . insulin glargine  10 Units Subcutaneous QHS  . levothyroxine  100 mcg Oral QAC breakfast  . loratadine  10 mg Oral Daily  . midodrine  10 mg Oral TID WC  . pantoprazole  40 mg Oral Daily  . piperacillin-tazobactam (ZOSYN)  IV  3.375 g Intravenous Q12H  . simvastatin  20 mg Oral q1800  . sodium chloride flush  3 mL Intravenous Q12H   sodium chloride, acetaminophen **OR** acetaminophen, bisacodyl, calcium acetate, hydroxypropyl methylcellulose / hypromellose, ipratropium-albuterol, lidocaine-prilocaine, metoprolol, morphine injection, nitroGLYCERIN, ondansetron **OR** ondansetron (ZOFRAN) IV, oxyCODONE-acetaminophen, sodium chloride flush  Assessment/ Plan:  82 y.o. male on hemodialysis Monday, Wednesday, Friday followed by [facility] nephrology, anemia of chronic kidney disease, secondary hyperparathyroidism, hypertension, hyperlipidemia, diabetes mellitus type  2, hypothyroidism, coronary artery disease, sleep apnea, GERD admitted for chest pain.  [facility] nephrology/MWF/[address]   1. End-stage renal disease on dialysis MWF.  Continue dialysis on MWF schedule. - No acute indication for dialysis today as the patient had dialysis yesterday. We will plan for dialysis again on Monday or sooner if indicated.  2. Anemia of chronic kidney disease. Hemoglobin 13.8 this morning. Continue to hold off on Epogen.  3. Secondary hyperparathyroidism. Continue calcium acetate 2 tablets by mouth 3 times a day with meals.  4. Chest pain/coronary artery disease/atrial fibrillation: Patient remains on amiodarone drip this a.m. Denies chest pain at the moment. Medical management recommended for his underlying coronary artery disease.  LOS: 6 Mariama Saintvil 7/29/20177:57 AM

## 2015-11-26 NOTE — Progress Notes (Signed)
PT Cancellation Note  Patient Details Name: Johnathan DEBO Sr. MRN: [id number] DOB: 10-Aug-1922   Cancelled Treatment:    Reason Eval/Treat Not Completed: Medical issues which prohibited therapy (Evaluation re-attempted.  Patient now with L femoral art line (which is barrier to mobility).  In addition, remains tachycardic, dependent on levophed for hemodynamic support.  Per progress note this date, patient/family considering comfort measures if patient still on levophed tomorrow.  Will continue hold and initiate PT evaluation as medically appropriate pending goals of care.)   Jamaira Sherk H. Manson Passey, PT, DPT, NCS 11/26/15, 11:01 AM [phone]

## 2015-11-27 LAB — GLUCOSE, CAPILLARY
GLUCOSE-CAPILLARY: 234 mg/dL — AB (ref 65–99)
Glucose-Capillary: 292 mg/dL — ABNORMAL HIGH (ref 65–99)
Glucose-Capillary: 186 mg/dL — ABNORMAL HIGH (ref 65–99)
Glucose-Capillary: 200 mg/dL — ABNORMAL HIGH (ref 65–99)

## 2015-11-27 NOTE — Evaluation (Signed)
Physical Therapy Evaluation Patient Details Name: Johnathan LONGER Sr. MRN: [id number] DOB: 1922-08-11 Today's Date: 11/27/2015   History of Present Illness  82 year old male  With H/o bronchitis, chronic kidney disease, coronary artery disease, MI, diabetes mellitus, GERD, hyperlipidemia, hypertension, sleep apnea, Now admitted with NSTEMI, and  hypotensive exhibiting  tachyrhythmia.   Clinical Impression  Pt has been in bed since arrival and is eager to get up despite feeling weak.  Pt needed a lot of assist to get to sitting and then to standing but showed great effort and motivation the entire time.  He was leaning back and had difficulty managing the walker with the few shuffle steps to the recliner but did not have any true LOBs despite needing assist the entire time.  Pt will benefit from rehab depending on further decisions regarding palliative/comfort measure (not currently on comfort care).     Follow Up Recommendations SNF    Equipment Recommendations       Recommendations for Other Services       Precautions / Restrictions Precautions Precautions: Fall Restrictions Weight Bearing Restrictions: No      Mobility  Bed Mobility Overal bed mobility: Needs Assistance Bed Mobility: Supine to Sit     Supine to sit: Mod assist     General bed mobility comments: Pt shows good effort, but ultimately does need a lot of assist to get to sitting and scoot to EOB  Transfers Overall transfer level: Needs assistance Equipment used: Rolling walker (2 wheeled) Transfers: Sit to/from Stand Sit to Stand: Mod assist         General transfer comment: Pt needing cues and assist for set up with feet and UEs and does need phyiscal assist to shift weight forward and use walker appropriately to maintain balance.  Ambulation/Gait Ambulation/Gait assistance: Mod assist Ambulation Distance (Feet): 2 Feet Assistive device: Rolling walker (2 wheeled)       General Gait Details: Pt is  able to take a few small, shuffling steps to go from bed to recliner.  He does have some posterior lean and needs assist to keep weight forward on the walker, he ultimately shows good effort and is able to get to recliner.   Stairs            Wheelchair Mobility    Modified Rankin (Stroke Patients Only)       Balance                                             Pertinent Vitals/Pain Pain Assessment:  (general soreness with prolonged time in bed)    Home Living Family/patient expects to be discharged to:: Private residence Living Arrangements: Spouse/significant other Available Help at Discharge: Family;Friend(s)   Home Access: Ramped entrance       Home Equipment: Wheelchair - [employer] - 2 wheels;Cane - single point      Prior Function Level of Independence: Independent with assistive device(s)         Comments: Pt reports that he can do all the in-home mobility, ADLs that he needs at home.      Hand Dominance        Extremity/Trunk Assessment   Upper Extremity Assessment: Generalized weakness (b/l shoulders with severe OA and elevation limitations)           Lower Extremity Assessment: Generalized weakness (again some arthritic  limitations but strength is functional)         Communication   Communication: No difficulties  Cognition Arousal/Alertness: Awake/alert Behavior During Therapy: WFL for tasks assessed/performed Overall Cognitive Status: Within Functional Limits for tasks assessed                      General Comments      Exercises        Assessment/Plan    PT Assessment Patient needs continued PT services  PT Diagnosis Difficulty walking;Generalized weakness   PT Problem List Decreased strength;Decreased activity tolerance;Decreased balance;Decreased mobility;Decreased safety awareness;Decreased knowledge of use of DME  PT Treatment Interventions Gait training;DME instruction;Functional  mobility training;Therapeutic activities;Therapeutic exercise;Balance training;Patient/family education   PT Goals (Current goals can be found in the Care Plan section) Acute Rehab PT Goals Patient Stated Goal: get stronger PT Goal Formulation: With patient Time For Goal Achievement: 12/10/15 Potential to Achieve Goals: Fair    Frequency Min 2X/week   Barriers to discharge        Co-evaluation               End of Session Equipment Utilized During Treatment: Gait belt Activity Tolerance: Patient limited by fatigue;Patient tolerated treatment well Patient left: in chair;with nursing/sitter in room Nurse Communication: Mobility status         Time: 1430-1501 PT Time Calculation (min) (ACUTE ONLY): 31 min   Charges:   PT Evaluation $PT Eval Moderate Complexity: 1 Procedure     PT G Codes:        Malachi Pro , DPT 11/27/2015, 4:14 PM

## 2015-11-27 NOTE — Progress Notes (Signed)
Name:           [name]. MRN:             [id number] DOB:             1923-03-15                     ADMISSION DATE:  11/20/2015 CONSULTATION DATE:11/22/15    DISCUSSION: 82 year old male  With H/o bronchitis, chronic kidney disease, coronary artery disease, MI, diabetes mellitus, GERD, hyperlipidemia, hypertension, sleep apnea, Now admitted with NSTEMI, and  hypotension exhibiting  tachyrhythmia requiring amiodarone. Persistent hypotension, not able to wean off levophed.   ASSESSMENT / PLAN:  PULMONARY A: Hx of OSA P:   -Supplemental O2 as needed for dyspnea or maintain O2 sats >92% - prn Duonebs q6hrs -Pulmonary Hygiene  CARDIOVASCULAR A:  NSTEMI Atrial fibrillation-- persistent tachycardia, completed amio infusion, now on oral dosing.  Hypotension- persistent secondary to sepsis, afib, now rate controlled, on levophed at 6 mcg.  Hx Hypertension, CAD, and Hyperlipidemia Echo results 11/24/15; EF 55% P:  Amiodarone PO Continue aspirin and plavix Stop levophed drip today, per discussion with family and patient. If tolerates then will continue all other measures; if not tolerates will change to comfort measures.  Continue Midodrine    RENAL A:   ESRD Hyponatremia-resolved P:   Dialysis per nephro dialysis 7/28   GASTROINTESTINAL A:   Hx of GERD P:   Carb modified/Renal diet Continue Pepcid  HEMATOLOGIC A:   No active issues P:  Subq Heparin for VTE prophylaxis Monitor for s/sx of bleeding Transfuse if hemoglobin <7  INFECTIOUS A:   Leukocytosis P:   Trend WBC and monitor fever curve  ENDOCRINE A:   Diabetes mellitus Hypothyroidism Secondary hyperparathyroidism P:   Blood sugar checks before meals and at bedtime Sliding scale insulin coverage Lantus at bedtime Continue Synthroid  NEUROLOGIC A:   No active issues P:   RASS goal: 0 Minimize sedating medication  SIGNIFICANT EVENTS: 7/23 Patient admitted to ICU for  NSTEMI  LINES/TUBES: 11/22/15 right femoral central line>>  STUDIES:  7/24 ECHO>>Aortic valve stenosis, EF of 50 -55%  CULTURES: None  ANTIBIOTICS: None  SUBJECTIVE:  Pt awake no acute distress or complaints  PHYSICAL EXAMINATION: General:  Elderly male, no acute distress, awake, alert.  Neuro: Awake, alert, oriented, follows command, no focal deficit HEENT: Atraumatic, normocephalic, supple, no JVD Cardiovascular: ST, no M/R/G noted Lungs:  rhonchi and expiratory wheezes diminished towards bases Abdomen:  Obese, round, active bowel sounds x4 Musculoskeletal:  normal bulk and tone Skin:  Grossly intact  Imaging Dg Chest Port 1 View  Result Date: 11/21/2015 CLINICAL DATA:  Chest pain. EXAM: PORTABLE CHEST 1 VIEW COMPARISON:  11/02/2015 FINDINGS: Single lead cardiac pacemaker is stable. There has been an interval placement of right internal jugular approach central venous catheter with tip within the expected location of distal superior vena cava. The cardiac silhouette is enlarged. Mediastinal contours appear intact. Atherosclerotic disease and tortuosity of the aorta are noted. There is no evidence of focal airspace consolidation, pleural effusion or pneumothorax. Mild pulmonary vascular congestion. Osseous structures are without acute abnormality. Soft tissues are grossly normal. IMPRESSION: Enlarged cardiac silhouette. Atherosclerotic disease of the aorta. Mild pulmonary vascular congestion. Electronically Signed   By: Ted Mcalpine M.D.   On: 11/21/2015 20:38    -[name], M.D.  11/27/2015    Critical Care Attestation.  I have personally obtained a history, examined  the patient, evaluated laboratory and imaging results, formulated the assessment and plan and placed orders. The Patient requires high complexity decision making for assessment and support, frequent evaluation and titration of therapies, application of advanced monitoring technologies and extensive  interpretation of multiple databases. The patient has critical illness that could lead imminently to failure of 1 or more organ systems and requires the highest level of physician preparedness to intervene.  Critical Care Time devoted to patient care services described in this note is 35 minutes and is exclusive of time spent in procedures.

## 2015-11-27 NOTE — Progress Notes (Signed)
[facility]  ROUNDING NOTE   Subjective:  Patient currently denies chest pain. Last hemodialysis was on Friday. Remains on low-dose norepinephrine at the moment. Family at bedside.  Objective:  Vital signs in last 24 hours:  Temp:  [97.6 F (36.4 C)-98.8 F (37.1 C)] 98.7 F (37.1 C) (07/30 0800) Pulse Rate:  [88-125] 93 (07/30 0900) Resp:  [13-23] 18 (07/30 0900) BP: (85-125)/(56-79) 125/77 (07/30 0900) SpO2:  [93 %-100 %] 99 % (07/30 0900) Arterial Line BP: (86-121)/(47-58) 121/58 (07/30 0900) Weight:  [91.2 kg (201 lb 1 oz)] 91.2 kg (201 lb 1 oz) (07/30 0427)  Weight change: -1.9 kg (-4 lb 3 oz) Filed Weights   11/25/15 1005 11/26/15 0500 11/27/15 0427  Weight: 93.1 kg (205 lb 4 oz) 89.6 kg (197 lb 8.5 oz) 91.2 kg (201 lb 1 oz)    Intake/Output: I/O last 3 completed shifts: In: 2712.4 [P.O.:480; I.V.:2182.4; IV Piggyback:50] Out: -    Intake/Output this shift:  Total I/O In: 103 [P.O.:100; I.V.:3] Out: -   Physical Exam: General: NAD, resting in bed  Head: Normocephalic, atraumatic. Moist oral mucosal membranes  Eyes: Anicteric  Neck: Supple, trachea midline  Lungs:  Clear to auscultation bilateral, normal effort   Heart: Irregular, crescendo systolic murmur  Abdomen:  Soft, nontender, BS present  Extremities: trace peripheral edema.  Neurologic: Nonfocal, moving all four extremities  Skin: No lesions  Access: LUE AVG, good thrill    Basic Metabolic Panel:  Recent Labs Lab 11/21/15 1600 11/23/15 0500 11/23/15 1520 11/24/15 0441 11/25/15 0604 11/26/15 0600  NA 133* 132*  --  138 135 135  K 3.9 4.0  --  3.7 3.8 4.1  CL 93* 91*  --  92* 90* 89*  CO2 25 29  --  GLUCOSE 146* 156*  --  138* 199* 294*  BUN 27* 28*  --  18 31* 27*  CREATININE 5.79* 5.81*  --  4.57* 6.59* 5.01*  CALCIUM 9.2 9.2  --  9.5 10.2 9.9  MG  --   --   --  1.7 1.7 1.9  PHOS 2.4*  --  4.0  --  4.8*  --     Liver Function Tests:  Recent Labs Lab  11/21/15 1600 11/23/15 1202 11/25/15 1656  AST 44* 23  --   ALT 14* 13*  --   ALKPHOS 48 50  --   BILITOT 0.5 0.4  --   PROT 6.0* 6.0*  --   ALBUMIN 2.9* 2.7* 2.8*   No results for input(s): LIPASE, AMYLASE in the last 168 hours. No results for input(s): AMMONIA in the last 168 hours.  CBC:  Recent Labs Lab 11/22/15 0550 11/23/15 0500 11/24/15 0441 11/25/15 0604 11/26/15 0600  WBC 10.8* 14.9* 16.4* 15.5* 16.0*  NEUTROABS  --   --   --   --  14.1*  HGB 13.4 12.8* 13.3 13.7 13.8  HCT 38.2* 38.9* 39.3* 41.2 40.5  MCV 104.6* 105.8* 105.2* 105.6* 104.0*  PLT 153 195 215 253 227    Cardiac Enzymes:  Recent Labs Lab 11/20/15 1250 11/20/15 1849 11/21/15 0047 11/21/15 1600 11/22/15 1932  TROPONINI 0.30* 3.11* 7.07* 6.56* 2.36*    BNP: Invalid input(s): POCBNP  CBG:  Recent Labs Lab 11/26/15 1141 11/26/15 1630 11/26/15 2146 11/27/15 0815 11/27/15 1129  GLUCAP 311* 275* 236* 234* 292*    Microbiology: Results for orders [place] or performed during the hospital encounter of 11/20/15  MRSA PCR Screening  Status: None   Collection Time: 11/20/15  4:45 PM  Result Value Ref Range Status   MRSA by PCR NEGATIVE NEGATIVE Final    Comment:        The GeneXpert MRSA Assay (FDA approved for NASAL specimens only), is one component of a comprehensive MRSA colonization surveillance program. It is not intended to diagnose MRSA infection nor to guide or monitor treatment for MRSA infections.   CULTURE, BLOOD (ROUTINE X 2) w Reflex to ID Panel     Status: None (Preliminary result)   Collection Time: 11/25/15  6:32 PM  Result Value Ref Range Status   Specimen Description BLOOD RIGHT ANTECUBITAL  Final   Special Requests BOTTLES DRAWN AEROBIC AND ANAEROBIC  5CC  Final   Culture NO GROWTH 2 DAYS  Final   Report Status PENDING  Incomplete  CULTURE, BLOOD (ROUTINE X 2) w Reflex to ID Panel     Status: None (Preliminary result)   Collection Time: 11/25/15  7:48 PM   Result Value Ref Range Status   Specimen Description BLOOD RIGHT ANTECUBITAL  Final   Special Requests BOTTLES DRAWN AEROBIC AND ANAEROBIC 8CC  Final   Culture NO GROWTH 2 DAYS  Final   Report Status PENDING  Incomplete    Coagulation Studies: No results for input(s): LABPROT, INR in the last 72 hours.  Urinalysis: No results for input(s): COLORURINE, LABSPEC, PHURINE, GLUCOSEU, HGBUR, BILIRUBINUR, KETONESUR, PROTEINUR, UROBILINOGEN, NITRITE, LEUKOCYTESUR in the last 72 hours.  Invalid input(s): APPERANCEUR    Imaging: Dg Chest Port 1 View  Result Date: 11/26/2015 CLINICAL DATA:  Acute respiratory failure EXAM: PORTABLE CHEST 1 VIEW COMPARISON:  November 25, 2015 FINDINGS: No pneumothorax. Stable mild cardiomegaly. A layering effusion and underlying atelectasis on the left is stable. Stable haziness in the right perihilar region may represent layering effusion or developing infiltrate. Recommend attention on follow-up. No other interval changes or acute abnormalities. IMPRESSION: 1. Stable small effusion and atelectasis on the left. 2. Increasing focal haziness in the right perihilar region may represent layering effusion versus developing infiltrate. Recommend attention on follow-up. 3. No other acute abnormalities. Electronically Signed   By: Gerome Sam III M.D   On: 11/26/2015 07:11    Medications:   . norepinephrine (LEVOPHED) Adult infusion 6 mcg/min (11/27/15 1020)   . allopurinol  100 mg Oral Daily  . amiodarone  200 mg Oral BID  . aspirin EC  81 mg Oral Daily  . calcium acetate (Phos Binder)  1,334 mg Oral TID WC  . clopidogrel  75 mg Oral Daily  . [START ON 11/28/2015] digoxin  0.125 mg Intravenous QODAY  . docusate sodium  100 mg Oral BID  . feeding supplement (NEPRO CARB STEADY)  237 mL Oral BID BM  . finasteride  5 mg Oral Daily  . fluticasone  2 spray Each Nare Daily  . gabapentin  100 mg Oral QHS  . heparin subcutaneous  5,000 Units Subcutaneous Q8H  .  hydrocortisone sod succinate (SOLU-CORTEF) inj  50 mg Intravenous Q6H  . insulin aspart  0-9 Units Subcutaneous TID WC  . insulin glargine  10 Units Subcutaneous QHS  . levothyroxine  100 mcg Oral QAC breakfast  . loratadine  10 mg Oral Daily  . metoprolol tartrate  25 mg Oral Q6H  . midodrine  10 mg Oral TID WC  . pantoprazole  40 mg Oral Daily  . piperacillin-tazobactam (ZOSYN)  IV  3.375 g Intravenous Q12H  . simvastatin  20 mg Oral q1800  .  sodium chloride flush  3 mL Intravenous Q12H   sodium chloride, acetaminophen **OR** acetaminophen, bisacodyl, calcium acetate, hydroxypropyl methylcellulose / hypromellose, ipratropium-albuterol, lidocaine-prilocaine, metoprolol, morphine injection, nitroGLYCERIN, ondansetron **OR** ondansetron (ZOFRAN) IV, oxyCODONE-acetaminophen, sodium chloride flush  Assessment/ Plan:  82 y.o. male on hemodialysis Monday, Wednesday, Friday followed by [facility] nephrology, anemia of chronic kidney disease, secondary hyperparathyroidism, hypertension, hyperlipidemia, diabetes mellitus type 2, hypothyroidism, coronary artery disease, sleep apnea, GERD admitted for chest pain.  [facility] nephrology/MWF/[address]   1. End-stage renal disease on dialysis MWF.  Continue dialysis on MWF schedule. - Patient due for hemodialysis again tomorrow. We will prepare orders for this.  2. Anemia of chronic kidney disease. Continue to hold Epogen for high hemoglobin.  3. Secondary hyperparathyroidism. Check phosphorus tomorrow with dialysis. Otherwise continue calcium acetate at its current dosage.  4. Chest pain/coronary artery disease/atrial fibrillation: Patient off amiodarone drip at the moment. Remains on norepinephrine. No active chest pain. Medical management was recommended for his underlying coronary artery disease.  LOS: 7 [name] 7/30/201711:30 AM

## 2015-11-27 NOTE — Progress Notes (Signed)
   11/27/15 1300  Clinical Encounter Type  Visited With Patient and family together  Visit Type Initial  Referral From Nurse;Family  Consult/Referral To Chaplain  Spiritual Encounters  Spiritual Needs Prayer  Advance Directives (For Healthcare)  Does patient have an advance directive? Yes

## 2015-11-27 NOTE — Progress Notes (Signed)
[employer] [facility]   PATIENT NAME: [name]    MR#:  [id number]  DATE OF BIRTH:  April 01, 1923  SUBJECTIVE:   Patient here due to a non-ST elevation MI, also noted to be in A. fib with RVR. Off amiodarone drip now. Remains on low dose Leveophed.  Family at bedside.  PO intake poor.    REVIEW OF SYSTEMS:    Review of Systems  Constitutional: Negative for chills and fever.  HENT: Negative for congestion and tinnitus.   Eyes: Negative for blurred vision and double vision.  Respiratory: Negative for cough, shortness of breath and wheezing.   Cardiovascular: Negative for chest pain, orthopnea and PND.  Gastrointestinal: Negative for abdominal pain, diarrhea, nausea and vomiting.  Genitourinary: Negative for dysuria and hematuria.  Neurological: Negative for dizziness, sensory change and focal weakness.  All other systems reviewed and are negative.   Nutrition: heart Healthy/Renal Tolerating Diet: Yes Tolerating PT: Await Eval.    DRUG ALLERGIES:   Allergies  Allergen Reactions  . Cinacalcet Other (See Comments)    Reaction: unknown  . Lisinopril Cough    VITALS:  Blood pressure (!) 95/55, pulse 84, temperature 98.7 F (37.1 C), temperature source Oral, resp. rate 14, height 5\' 6"  (1.676 m), weight 91.2 kg (201 lb 1 oz), SpO2 100 %.  PHYSICAL EXAMINATION:   Physical Exam  GENERAL:  82 y.o.-year-old obese patient lying in bed in no acute distress.  EYES: Pupils equal, round, reactive to light and accommodation. No scleral icterus. Extraocular muscles intact.  HEENT: Head atraumatic, normocephalic. Oropharynx and nasopharynx clear.  NECK:  Supple, no jugular venous distention. No thyroid enlargement, no tenderness.  LUNGS: Normal breath sounds bilaterally, no wheezing, rales, rhonchi. No use of accessory muscles of respiration.  CARDIOVASCULAR: S1, S2 Tachy, irregular No murmurs, rubs, or gallops.  ABDOMEN: Soft, nontender, nondistended.  Bowel sounds present. No organomegaly or mass.  EXTREMITIES: No cyanosis, clubbing, +1-2 edema b/l.  LUE > RUE.     NEUROLOGIC: Cranial nerves II through XII are intact. No focal Motor or sensory deficits b/l. Globally weak.   PSYCHIATRIC: The patient is alert and oriented x 3.  SKIN: No obvious rash, lesion, or ulcer.   Left upper ext. AV fistula with good bruit, good thrill.    LABORATORY PANEL:   CBC  Recent Labs Lab 11/26/15 0600  WBC 16.0*  HGB 13.8  HCT 40.5  PLT 227   ------------------------------------------------------------------------------------------------------------------  Chemistries   Recent Labs Lab 11/23/15 1202  11/26/15 0600  NA  --   < > 135  K  --   < > 4.1  CL  --   < > 89*  CO2  --   < > 26  GLUCOSE  --   < > 294*  BUN  --   < > 27*  CREATININE  --   < > 5.01*  CALCIUM  --   < > 9.9  MG  --   < > 1.9  AST 23  --   --   ALT 13*  --   --   ALKPHOS 50  --   --   BILITOT 0.4  --   --   < > = values in this interval not displayed. ------------------------------------------------------------------------------------------------------------------  Cardiac Enzymes  Recent Labs Lab 11/22/15 1932  TROPONINI 2.36*   ------------------------------------------------------------------------------------------------------------------  RADIOLOGY:  Dg Chest Port 1 View  Result Date: 11/26/2015 CLINICAL DATA:  Acute respiratory failure EXAM: PORTABLE CHEST 1  VIEW COMPARISON:  November 25, 2015 FINDINGS: No pneumothorax. Stable mild cardiomegaly. A layering effusion and underlying atelectasis on the left is stable. Stable haziness in the right perihilar region may represent layering effusion or developing infiltrate. Recommend attention on follow-up. No other interval changes or acute abnormalities. IMPRESSION: 1. Stable small effusion and atelectasis on the left. 2. Increasing focal haziness in the right perihilar region may represent layering effusion  versus developing infiltrate. Recommend attention on follow-up. 3. No other acute abnormalities. Electronically Signed   By: Gerome Sam III M.D   On: 11/26/2015 07:11    ASSESSMENT AND PLAN:   82 year old male with ESRD on hemodialysis, anemia of chronic disease, diabetes and CAD who presented with chest pain and found to have non-STEMI.  1. Non-ST elevation MI-patient's troponin maxed out at 7.0. -Treated conservatively, heparin was stopped 11/25/15. Currently asymptomatic. -Continue aspirin, Plavix, statin, cont. Low dose B-blocker.    2. Atrial fibrillation with RVR-was on amiodarone drip without much improvement. -Now taken off the amiodarone drip. Cont. oral amiodarone. Rates have improved.  - was given Dig load by Cards yesterday. Cont. Low dose Metoprolol.  -Appreciate cardiology input.  3. End-stage renal disease on hemodialysis-continue dialysis Monday Wednesday Friday. Nephrology following. No acute indication dialysis presently. - plan for HD tomorrow as per Nephro.   4. Diabetes type 2 with renal, patient is-continue sliding scale insulin, Lantus, follow blood sugars which are stable.  5. BPH-continue finasteride.  6. Hypothyroidism-continue Synthroid.  7. Hypotension-multifactorial in nature related to underlying A. fib with RVR, no clinical evidence of sepsis. -Continue IV Levophed and plane to wean off today and monitor.   8. Delirium-multifactorial nature much improved now.  9. Aspiration pneumonia - cont. Zosyn.     All the records are reviewed and case discussed with Care Management/Social Workerr. Management plans discussed with the patient, family and they are in agreement.  CODE STATUS: DO NOT RESUSCITATE  DVT Prophylaxis: Heparin subcutaneous  TOTAL TIME TAKING CARE OF THIS PATIENT: 30 minutes.   POSSIBLE D/C IN 2-3 DAYS, DEPENDING ON CLINICAL CONDITION.   Houston Siren M.D on 11/27/2015 at 1:19 PM  Between 7am to 6pm - Pager -  [phone]  After 6pm go to [email or web address] - password [email or web address]  [place] [facility]  Office  [phone]  CC: Primary care physician; Marisue Ivan, MD

## 2015-11-27 NOTE — Progress Notes (Signed)
Family and patient made decision to stop vasopressors. Pallative care consult has been entered, but patient is not comfort care at this time. Will not re-initiate vasopressors. Helmut Muster, [facility], stated floor will not accept patient hypotensive until he is made comfort care. Per Dr. Richarda Osmond, cancel med-surg transfer orders at this time.

## 2015-11-27 NOTE — Progress Notes (Signed)
Pharmacy Antibiotic Note  Johnathan PICCHI Sr. is a 82 y.o. male admitted on 11/20/2015 with chest pain and NSTEMI/ ?aspiration PNA. Pharmacy consulted for Zosyn dosing for aspiration pneumonia in hemodialysis patient.   Plan: Will continue the patient on Zosyn 3.375 Iv EI every 12 hours for Hemodialysis dosing.  Height: 5\' 6"  (167.6 cm) Weight: 201 lb 1 oz (91.2 kg) IBW/kg (Calculated) : 63.8  Temp (24hrs), Avg:98.5 F (36.9 C), Min:97.6 F (36.4 C), Max:98.8 F (37.1 C)   Recent Labs Lab 11/21/15 1600 11/22/15 0550 11/23/15 0500 11/24/15 0441 11/25/15 0604 11/25/15 1656 11/25/15 2203 11/26/15 0055 11/26/15 0600  WBC 13.3* 10.8* 14.9* 16.4* 15.5*  --   --   --  16.0*  CREATININE 5.79*  --  5.81* 4.57* 6.59*  --   --   --  5.01*  LATICACIDVEN  --   --   --   --   --  3.4* 4.1* 5.0* 3.3*    Estimated Creatinine Clearance: 10.2 mL/min (by C-G formula based on SCr of 5.01 mg/dL).    Allergies  Allergen Reactions  . Cinacalcet Other (See Comments)    Reaction: unknown  . Lisinopril Cough    Antimicrobials this admission: 11/25/15 Zosyn >>   Microbiology results: 07/28 BCx: NG x 24hr 7/28 Sputum:  7/23 MRSA PCR: negative 7/29 CXR: ?developing infiltrate  Thank you for allowing pharmacy to be a part of this patient's care . Bari Mantis PharmD Clinical Pharmacist 11/27/2015

## 2015-11-28 DIAGNOSIS — Z66 Do not resuscitate: Secondary | ICD-10-CM

## 2015-11-28 DIAGNOSIS — Z515 Encounter for palliative care: Secondary | ICD-10-CM

## 2015-11-28 LAB — BASIC METABOLIC PANEL
ANION GAP: 14 (ref 5–15)
BUN: 67 mg/dL — AB (ref 6–20)
CHLORIDE: 90 mmol/L — AB (ref 101–111)
CO2: 30 mmol/L (ref 22–32)
Calcium: 10.4 mg/dL — ABNORMAL HIGH (ref 8.9–10.3)
Creatinine, Ser: 7.4 mg/dL — ABNORMAL HIGH (ref 0.61–1.24)
GFR calc Af Amer: 7 mL/min — ABNORMAL LOW (ref >60)
GFR, EST NON AFRICAN AMERICAN: 6 mL/min — AB (ref >60)
GLUCOSE: 188 mg/dL — AB (ref 65–99)
POTASSIUM: 3.9 mmol/L (ref 3.5–5.1)
Sodium: 134 mmol/L — ABNORMAL LOW (ref 135–145)

## 2015-11-28 LAB — CBC
HEMATOCRIT: 37.8 % — AB (ref 40.0–52.0)
HEMOGLOBIN: 13.3 g/dL (ref 13.0–18.0)
MCH: 36.6 pg — ABNORMAL HIGH (ref 26.0–34.0)
MCHC: 35.1 g/dL (ref 32.0–36.0)
MCV: 104.1 fL — AB (ref 80.0–100.0)
Platelets: 177 10*3/uL (ref 150–440)
RBC: 3.64 MIL/uL — ABNORMAL LOW (ref 4.40–5.90)
RDW: 14.8 % — ABNORMAL HIGH (ref 11.5–14.5)
WBC: 9.9 10*3/uL (ref 3.8–10.6)

## 2015-11-28 LAB — GLUCOSE, CAPILLARY
GLUCOSE-CAPILLARY: 178 mg/dL — AB (ref 65–99)
GLUCOSE-CAPILLARY: 269 mg/dL — AB (ref 65–99)
Glucose-Capillary: 238 mg/dL — ABNORMAL HIGH (ref 65–99)

## 2015-11-28 LAB — MAGNESIUM: Magnesium: 2.1 mg/dL (ref 1.7–2.4)

## 2015-11-28 MED ORDER — GLIPIZIDE ER 5 MG PO TB24: 5.0000 mg | ORAL_TABLET | Freq: Every day | ORAL | Status: DC

## 2015-11-28 MED ORDER — MORPHINE SULFATE (PF) 2 MG/ML IV SOLN: 2.0000 mg | INTRAVENOUS | Status: DC | PRN

## 2015-11-28 MED ORDER — HYDROCORTISONE NA SUCCINATE PF 100 MG IJ SOLR
50.0000 mg | Freq: Two times a day (BID) | INTRAMUSCULAR | Status: DC
  Administered 2015-11-28 – 2015-11-29 (×2): 50 mg via INTRAVENOUS
  Filled 2015-11-28 (×2): qty 2

## 2015-11-28 MED ORDER — DIGOXIN 125 MCG PO TABS: 0.0625 mg | ORAL_TABLET | Freq: Every day | ORAL | Status: DC

## 2015-11-28 MED ORDER — DIGOXIN 125 MCG PO TABS
0.0625 mg | ORAL_TABLET | ORAL | Status: DC
  Administered 2015-11-28: 0.0625 mg via ORAL
  Filled 2015-11-28: qty 1

## 2015-11-28 NOTE — Progress Notes (Signed)
[facility]  ROUNDING NOTE   Subjective:  Family at bedside. More alert and awake this morning. Dialysis for later today.   Objective:  Vital signs in last 24 hours:  Temp:  [97.5 F (36.4 C)] 97.5 F (36.4 C) (07/31 0200) Pulse Rate:  [67-85] 73 (07/31 0800) Resp:  [12-25] 16 (07/31 0800) BP: (68-96)/(35-58) 93/51 (07/31 0800) SpO2:  [90 %-100 %] 97 % (07/31 0800) Weight:  [92.7 kg (204 lb 5.9 oz)] 92.7 kg (204 lb 5.9 oz) (07/31 0500)  Weight change: 1.5 kg (3 lb 4.9 oz) Filed Weights   11/26/15 0500 11/27/15 0427 11/28/15 0500  Weight: 89.6 kg (197 lb 8.5 oz) 91.2 kg (201 lb 1 oz) 92.7 kg (204 lb 5.9 oz)    Intake/Output: I/O last 3 completed shifts: In: 578.6 [P.O.:220; I.V.:258.6; IV Piggyback:100] Out: 1 [Stool:1]   Intake/Output this shift:  Total I/O In: 250 [I.V.:250] Out: -   Physical Exam: General: NAD, laying in bed  Head: Normocephalic, atraumatic. Moist oral mucosal membranes  Eyes: Anicteric  Neck: Supple, trachea midline  Lungs:  Clear to auscultation bilateral, normal effort 3L [place]  Heart: Irregular, crescendo systolic murmur  Abdomen:  Soft, nontender, BS present  Extremities: trace peripheral edema.  Neurologic: Nonfocal, moving all four extremities  Skin: No lesions  Access: LUE AVG, good thrill    Basic Metabolic Panel:  Recent Labs Lab 11/21/15 1600 11/23/15 0500 11/23/15 1520 11/24/15 0441 11/25/15 0604 11/26/15 0600 11/28/15 0625  NA 133* 132*  --  138 135 135 134*  K 3.9 4.0  --  3.7 3.8 4.1 3.9  CL 93* 91*  --  92* 90* 89* 90*  CO2 25 29  --  GLUCOSE 146* 156*  --  138* 199* 294* 188*  BUN 27* 28*  --  18 31* 27* 67*  CREATININE 5.79* 5.81*  --  4.57* 6.59* 5.01* 7.40*  CALCIUM 9.2 9.2  --  9.5 10.2 9.9 10.4*  MG  --   --   --  1.7 1.7 1.9 2.1  PHOS 2.4*  --  4.0  --  4.8*  --   --     Liver Function Tests:  Recent Labs Lab 11/21/15 1600 11/23/15 1202 11/25/15 1656  AST 44* 23  --   ALT  14* 13*  --   ALKPHOS 48 50  --   BILITOT 0.5 0.4  --   PROT 6.0* 6.0*  --   ALBUMIN 2.9* 2.7* 2.8*   No results for input(s): LIPASE, AMYLASE in the last 168 hours. No results for input(s): AMMONIA in the last 168 hours.  CBC:  Recent Labs Lab 11/23/15 0500 11/24/15 0441 11/25/15 0604 11/26/15 0600 11/28/15 0625  WBC 14.9* 16.4* 15.5* 16.0* 9.9  NEUTROABS  --   --   --  14.1*  --   HGB 12.8* 13.3 13.7 13.8 13.3  HCT 38.9* 39.3* 41.2 40.5 37.8*  MCV 105.8* 105.2* 105.6* 104.0* 104.1*  PLT 195 215 253 227 177    Cardiac Enzymes:  Recent Labs Lab 11/21/15 1600 11/22/15 1932  TROPONINI 6.56* 2.36*    BNP: Invalid input(s): POCBNP  CBG:  Recent Labs Lab 11/27/15 0815 11/27/15 1129 11/27/15 1602 11/27/15 2258 11/28/15 0738  GLUCAP 234* 292* 186* 200* 178*    Microbiology: Results for orders placed or performed during the hospital encounter of 11/20/15  MRSA PCR Screening     Status: None   Collection Time: 11/20/15  4:45  PM  Result Value Ref Range Status   MRSA by PCR NEGATIVE NEGATIVE Final    Comment:        The GeneXpert MRSA Assay (FDA approved for NASAL specimens only), is one component of a comprehensive MRSA colonization surveillance program. It is not intended to diagnose MRSA infection nor to guide or monitor treatment for MRSA infections.   CULTURE, BLOOD (ROUTINE X 2) w Reflex to ID Panel     Status: None (Preliminary result)   Collection Time: 11/25/15  6:32 PM  Result Value Ref Range Status   Specimen Description BLOOD RIGHT ANTECUBITAL  Final   Special Requests BOTTLES DRAWN AEROBIC AND ANAEROBIC  5CC  Final   Culture NO GROWTH 2 DAYS  Final   Report Status PENDING  Incomplete  CULTURE, BLOOD (ROUTINE X 2) w Reflex to ID Panel     Status: None (Preliminary result)   Collection Time: 11/25/15  7:48 PM  Result Value Ref Range Status   Specimen Description BLOOD RIGHT ANTECUBITAL  Final   Special Requests BOTTLES DRAWN AEROBIC AND  ANAEROBIC 8CC  Final   Culture NO GROWTH 2 DAYS  Final   Report Status PENDING  Incomplete    Coagulation Studies: No results for input(s): LABPROT, INR in the last 72 hours.  Urinalysis: No results for input(s): COLORURINE, LABSPEC, PHURINE, GLUCOSEU, HGBUR, BILIRUBINUR, KETONESUR, PROTEINUR, UROBILINOGEN, NITRITE, LEUKOCYTESUR in the last 72 hours.  Invalid input(s): APPERANCEUR    Imaging: No results found.   Medications:     . allopurinol  100 mg Oral Daily  . amiodarone  200 mg Oral BID  . aspirin EC  81 mg Oral Daily  . calcium acetate (Phos Binder)  1,334 mg Oral TID WC  . clopidogrel  75 mg Oral Daily  . digoxin  0.0625 mg Oral Daily  . docusate sodium  100 mg Oral BID  . feeding supplement (NEPRO CARB STEADY)  237 mL Oral BID BM  . finasteride  5 mg Oral Daily  . gabapentin  100 mg Oral QHS  . [START ON 11/29/2015] glipiZIDE  5 mg Oral Q breakfast  . heparin subcutaneous  5,000 Units Subcutaneous Q8H  . hydrocortisone sod succinate (SOLU-CORTEF) inj  50 mg Intravenous Q12H  . insulin aspart  0-9 Units Subcutaneous TID WC  . levothyroxine  100 mcg Oral QAC breakfast  . midodrine  10 mg Oral TID WC  . sodium chloride flush  3 mL Intravenous Q12H   sodium chloride, acetaminophen **OR** [DISCONTINUED] acetaminophen, bisacodyl, calcium acetate, hydroxypropyl methylcellulose / hypromellose, ipratropium-albuterol, lidocaine-prilocaine, metoprolol, morphine injection, nitroGLYCERIN, [DISCONTINUED] ondansetron **OR** ondansetron (ZOFRAN) IV, oxyCODONE-acetaminophen, sodium chloride flush  Assessment/ Plan:  82 y.o. male on hemodialysis Monday, Wednesday, Friday followed by [facility] nephrology, anemia of chronic kidney disease, secondary hyperparathyroidism, hypertension, hyperlipidemia, diabetes mellitus type 2, hypothyroidism, coronary artery disease, sleep apnea, GERD admitted for chest pain.  [facility] nephrology/MWF/[address]   1. End-stage renal disease on dialysis MWF.   Continue dialysis on MWF schedule. Dialysis for later today  2. Anemia of chronic kidney disease. Continue to hold Epogen   3. Secondary hyperparathyroidism with hypercalcemia.  - On calcium acetate, may need to decrease dose.   4. Hypotension:  - midodrine.   LOS: 8 [place] 7/31/201711:03 AM

## 2015-11-28 NOTE — Progress Notes (Signed)
Report called to [facility], pt going to room 105. Pt moved to bed without incident and transferred to room 105.

## 2015-11-28 NOTE — Progress Notes (Signed)
[employer] - [place] [facility]   PATIENT NAME: [name]    MR#:  [id number]  DATE OF BIRTH:  1922-10-31  SUBJECTIVE:   Remains quite hypotensive this a.m. Off Pressors.  Family at bedside.  Palliative care to have family meeting this afternoon.     REVIEW OF SYSTEMS:    Review of Systems  Unable to perform ROS: Mental acuity    Nutrition: heart Healthy/Renal Tolerating Diet: Little. Tolerating PT: Await Eval.    DRUG ALLERGIES:   Allergies  Allergen Reactions  . Cinacalcet Other (See Comments)    Reaction: unknown  . Lisinopril Cough    VITALS:  Blood pressure (!) 72/38, pulse 67, temperature 97.8 F (36.6 C), temperature source Oral, resp. rate 13, height  (1.676 m), weight 92.7 kg (204 lb 5.9 oz), SpO2 93 %.  PHYSICAL EXAMINATION:   Physical Exam  GENERAL:  82 y.o.-year-old obese patient lying in bed lethargic/comfortable.  EYES: Pupils equal, round, reactive to light. No scleral icterus. Extraocular muscles intact.  HEENT: Head atraumatic, normocephalic. Oropharynx and nasopharynx clear.  NECK:  Supple, no jugular venous distention. No thyroid enlargement, no tenderness.  LUNGS: Normal breath sounds bilaterally, no wheezing, rales, rhonchi. No use of accessory muscles of respiration.  CARDIOVASCULAR: S1, S2 Tachy, irregular No murmurs, rubs, or gallops.  ABDOMEN: Soft, nontender, nondistended. Bowel sounds present. No organomegaly or mass.  EXTREMITIES: No cyanosis, clubbing, +1-2 edema b/l.  LUE > RUE.     NEUROLOGIC: Encephalopathic/Lethargic.  Globally weak.Difficult to do a full neurological exam. PSYCHIATRIC: Encephalopathic/lethargic. SKIN: No obvious rash, lesion, or ulcer.   Left upper ext. AV fistula with good bruit, good thrill.    LABORATORY PANEL:   CBC  Recent Labs Lab 11/28/15 0625  WBC 9.9  HGB 13.3  HCT 37.8*  PLT 177    ------------------------------------------------------------------------------------------------------------------  Chemistries   Recent Labs Lab 11/23/15 1202  11/28/15 0625  NA  --   < > 134*  K  --   < > 3.9  CL  --   < > 90*  CO2  --   < > 30  GLUCOSE  --   < > 188*  BUN  --   < > 67*  CREATININE  --   < > 7.40*  CALCIUM  --   < > 10.4*  MG  --   < > 2.1  AST 23  --   --   ALT 13*  --   --   ALKPHOS 50  --   --   BILITOT 0.4  --   --   < > = values in this interval not displayed. ------------------------------------------------------------------------------------------------------------------  Cardiac Enzymes  Recent Labs Lab 11/22/15 1932  TROPONINI 2.36*   ------------------------------------------------------------------------------------------------------------------  RADIOLOGY:  No results found.   ASSESSMENT AND PLAN:   82 year old male with ESRD on hemodialysis, anemia of chronic disease, diabetes and CAD who presented with chest pain and found to have non-STEMI.  1. Non-ST elevation MI 2. Atrial fibrillation with RVR 3. End-stage renal disease on hemodialysis 4. Diabetes type 2 with renal complications 5. BPH 6. Hypothyroidism 7. Hypotension 8. Aspiration pneumonia   Patient remains quite lethargic/encephalopathic off the vasopressors since yesterday. Family at bedside. Palliative care to have a discussion with the family today about goals of care and patient likely to be made comfort care only given his multiple comorbidities as mentioned above. Continue supportive care for now and likely can place comfort care orders after the family  meeting this afternoon.    All the records are reviewed and case discussed with Care Management/Social Workerr. Management plans discussed with the patient, family and they are in agreement.  CODE STATUS: DO NOT RESUSCITATE  DVT Prophylaxis: Heparin subcutaneous  TOTAL TIME TAKING CARE OF THIS PATIENT: 25  minutes.   POSSIBLE D/C IN 2-3 DAYS, DEPENDING ON CLINICAL CONDITION.   Houston Siren M.D on 11/28/2015 at 2:24 PM  Between 7am to 6pm - Pager - [phone]  After 6pm go to [email or web address] - password [email or web address] [facility]  [facility]  Office  [phone]  CC: Primary care physician; Marisue Ivan, MD

## 2015-11-28 NOTE — Progress Notes (Signed)
Inpatient Diabetes Program Recommendations  AACE/ADA: New Consensus Statement on Inpatient Glycemic Control (2015)  Target Ranges:  Prepandial:   less than 140 mg/dL      Peak postprandial:   less than 180 mg/dL (1-2 hours)      Critically ill patients:  140 - 180 mg/dL   Results for ALBUS, TUNNICLIFF SR. (MRN [id number]) as of 11/28/2015 13:15  Ref. Range 11/27/2015 08:15 11/27/2015 11:29 11/27/2015 16:02 11/27/2015 22:58  Glucose-Capillary Latest Ref Range: 65 - 99 mg/dL 734 (H) 193 (H) 790 (H) 200 (H)   Results for YSIDRO, ACHENBACH SR. (MRN [id number]) as of 11/28/2015 13:15  Ref. Range 11/28/2015 07:38 11/28/2015 11:09  Glucose-Capillary Latest Ref Range: 65 - 99 mg/dL 992 (H) 426 (H)    Home DM Meds: Lantus 10 units QHS       Glipizide 5 mg daily  Current Insulin Orders: Glipizide 5 mg daily      Novolog Sensitive Correction Scale/ SSI (0-9 units) TID AC     -Patient has been getting Lantus 10 units QHS here in hospital (home dose).  -Not sure why Lantus discontinued?  -Patient continues to get IV Solucortef 50 mg bid.    MD- Please consider re-ordering Lantus 10 units QHS  Please also consider increasing Novolog Correction Scale/ SSI to Moderate scale (0-15 units) TID AC + HS      --Will follow patient during hospitalization--  Ambrose Finland RN, MSN, CDE Diabetes Coordinator Inpatient Glycemic Control Team Team Pager: [phone] (8a-5p)

## 2015-11-28 NOTE — Consult Note (Signed)
Consultation Note Date: 11/28/2015   Patient Name: Johnathan GEISEN Sr.  DOB: Jun 25, 1922  MRN: [id number]  Age / Sex: 82 y.o., male  PCP: Marisue Ivan, MD Referring Physician: Houston Siren, MD  Reason for Consultation: Establishing goals of care, Non pain symptom management, Pain control and Psychosocial/spiritual support  HPI/Patient Profile: 82 y.o. male   admitted on 11/20/2015 with a past medical history significant for bronchitis, chronic kidney disease, coronary artery disease, MI, diabetes mellitus, GERD, hyperlipidemia, hypertension, sleep apnea.   Patient was admitted on 7/23 with chest pain, troponins where 3.11. Patient was initiated on heparin drip. On following day,7/24 troponin was noted to be 7.07. Patient was getting medically managed as he was a poor candidate for cardiac catheterization.   Patient also noted to have end-stage renal disease and was dialyzed on 7/24,  HD qMWF.    Multiple co-morbid ites, overall poor prognosis, decision to stop dialysis. Family faced with anticipatory care needs.   Clinical Assessment and Goals of Care:   This NP Lorinda Creed reviewed medical records, received report from team, assessed the patient and then meet at the patient's bedside along with his large family to include wife, two daughters, son and several other members to discuss diagnosis, prognosis, GOC, EOL wishes disposition and options.  Daughter [name]  A detailed discussion was had today regarding advanced directives.  Concepts specific to code status, artifical feeding and hydration, continued IV antibiotics and rehospitalization was had.  The difference between a aggressive medical intervention path  and a palliative comfort care path for this patient at this time was had.  Values and goals of care important to patient and family were attempted to be elicited.  Concept of Hospice  and Palliative Care were discussed  Natural trajectory and expectations at EOL were discussed.  Questions and concerns addressed.  Family encouraged to call with questions or concerns.  PMT will continue to support holistically.   SUMMARY OF RECOMMENDATIONS    Code Status/Advance Care Planning:  DNR    Symptom Management:   Dyspnea/Pain: Morphine 2 mg IV every 1 hr prn  Agitation: Ativan 1 mg IV every 4 hrs prn  Palliative Prophylaxis:   Aspiration, Bowel Regimen, Frequent Pain Assessment and Oral Care  Additional Recommendations (Limitations, Scope, Preferences):  Full Comfort Care, Initiate Comfort Feeding, No Diagnostics, No Glucose Monitoring, No Hemodialysis, No IV Antibiotics, No IV Fluids and No Lab Draws  Psycho-social/Spiritual:     Additional Recommendations: Education on Hospice  Prognosis:   < 2 weeks  Discharge Planning: Hospice facility      Primary Diagnoses: Present on Admission: . Atypical chest pain   I have reviewed the medical record, interviewed the patient and family, and examined the patient. The following aspects are pertinent.  Past Medical History:  Diagnosis Date  . Arthritis   . Bronchitis   . Chronic kidney disease   . Coronary artery disease   . Diabetes mellitus without complication (HCC)   . GERD (gastroesophageal reflux disease)   .  Gout   . Hypercholesterolemia   . Hyperlipidemia   . Hypertension   . Hypothyroidism   . Obesity   . Shingles   . Sleep apnea    Social History   Social History  . Marital status: Married    Spouse name: N/A  . Number of children: N/A  . Years of education: N/A   Social History Main Topics  . Smoking status: Former Smoker    Packs/day: 2.00    Years: 15.00  . Smokeless tobacco: Never Used  . Alcohol use No  . Drug use: No  . Sexual activity: Not Asked   Other Topics Concern  . None   Social History Narrative  . None   Family History  Problem Relation Age of Onset  .  Diabetes Mellitus II Father    Scheduled Meds: . allopurinol  100 mg Oral Daily  . amiodarone  200 mg Oral BID  . aspirin EC  81 mg Oral Daily  . calcium acetate (Phos Binder)  1,334 mg Oral TID WC  . clopidogrel  75 mg Oral Daily  . digoxin  0.0625 mg Oral Daily  . docusate sodium  100 mg Oral BID  . feeding supplement (NEPRO CARB STEADY)  237 mL Oral BID BM  . finasteride  5 mg Oral Daily  . gabapentin  100 mg Oral QHS  . [START ON 11/29/2015] glipiZIDE  5 mg Oral Q breakfast  . heparin subcutaneous  5,000 Units Subcutaneous Q8H  . hydrocortisone sod succinate (SOLU-CORTEF) inj  50 mg Intravenous Q12H  . insulin aspart  0-9 Units Subcutaneous TID WC  . levothyroxine  100 mcg Oral QAC breakfast  . midodrine  10 mg Oral TID WC  . sodium chloride flush  3 mL Intravenous Q12H   Continuous Infusions:  PRN Meds:.sodium chloride, acetaminophen **OR** [DISCONTINUED] acetaminophen, bisacodyl, calcium acetate, hydroxypropyl methylcellulose / hypromellose, ipratropium-albuterol, lidocaine-prilocaine, metoprolol, morphine injection, nitroGLYCERIN, [DISCONTINUED] ondansetron **OR** ondansetron (ZOFRAN) IV, oxyCODONE-acetaminophen, sodium chloride flush Medications Prior to Admission:  Prior to Admission medications   Medication Sig Start Date End Date Taking? Authorizing Provider  acetaminophen (TYLENOL) 325 MG tablet Take 650 mg by mouth every 8 (eight) hours as needed for mild pain, moderate pain or fever.    Yes Historical Provider, MD  albuterol-ipratropium (COMBIVENT) 18-103 MCG/ACT inhaler Inhale 2 puffs into the lungs every 4 (four) hours as needed for wheezing or shortness of breath.   Yes Historical Provider, MD  allopurinol (ZYLOPRIM) 100 MG tablet Take 100 mg by mouth daily.   Yes Historical Provider, MD  aspirin 81 MG chewable tablet Chew 81 mg by mouth daily.   Yes Historical Provider, MD  atorvastatin (LIPITOR) 80 MG tablet Take 80 mg by mouth at bedtime.   Yes Historical Provider, MD    b complex-vitamin c-folic acid (NEPHRO-VITE) 0.8 MG TABS tablet Take 1 tablet by mouth daily.   Yes Historical Provider, MD  calcium acetate (PHOSLO) 667 MG capsule Take 1,334 mg by mouth See admin instructions. Take 2 capsules (1334 mg) by mouth three times a day after meals, and take 2 capsules (1334 mg) by mouth with snacks.   Yes Historical Provider, MD  clopidogrel (PLAVIX) 75 MG tablet Take 1 tablet (75 mg total) by mouth daily. 11/03/15  Yes [name], MD  docusate sodium (COLACE) 100 MG capsule Take 100 mg by mouth every other day.   Yes Historical Provider, MD  finasteride (PROSCAR) 5 MG tablet Take 5 mg by mouth at bedtime.  Yes Historical Provider, MD  flunisolide (NASAREL) 29 MCG/ACT (0.025%) nasal spray Place 2 sprays into the nose 2 (two) times daily. Dose is for each nostril.   Yes Historical Provider, MD  furosemide (LASIX) 40 MG tablet Take 40 mg by mouth daily.   Yes Historical Provider, MD  gabapentin (NEURONTIN) 100 MG capsule Take 100 mg by mouth at bedtime.    Yes Historical Provider, MD  glipiZIDE (GLUCOTROL) 5 MG tablet Take by mouth daily before breakfast.   Yes Historical Provider, MD  hydroxypropyl methylcellulose / hypromellose (ISOPTO TEARS / GONIOVISC) 2.5 % ophthalmic solution Place 1 drop into both eyes 2 (two) times daily.    Yes Historical Provider, MD  insulin glargine (LANTUS) 100 UNIT/ML injection Inject 10 Units into the skin at bedtime.    Yes Historical Provider, MD  isosorbide mononitrate (IMDUR) 30 MG 24 hr tablet Take 1 tablet (30 mg total) by mouth daily. 11/02/15  Yes [name], MD  LACTOBACILLUS PO Take 2 tablets by mouth 2 (two) times daily before a meal.    Yes Historical Provider, MD  levothyroxine (SYNTHROID, LEVOTHROID) 50 MCG tablet Take 50 mcg by mouth daily before breakfast. 11/02/15  Yes Historical Provider, MD  lidocaine-prilocaine (EMLA) cream Apply to dialysis access before treatment Monday, Wednesday, Friday 07/25/15  Yes Historical  Provider, MD  loratadine (CLARITIN) 10 MG tablet Take 10 mg by mouth daily.   Yes Historical Provider, MD  omega-3 acid ethyl esters (LOVAZA) 1 g capsule Take 1 g by mouth 2 (two) times daily.   Yes Historical Provider, MD  omeprazole (PRILOSEC) 40 MG capsule Take 1 capsule (40 mg total) by mouth 2 (two) times daily. 11/02/15  Yes Milagros Loll, MD   Allergies  Allergen Reactions  . Cinacalcet Other (See Comments)    Reaction: unknown  . Lisinopril Cough   Review of Systems  Unable to perform ROS: Acuity of condition    Physical Exam  Constitutional: He appears lethargic. He appears ill. Nasal cannula in place.  Cardiovascular: Normal rate, regular rhythm and normal heart sounds.   Pulmonary/Chest: He has decreased breath sounds in the right lower field and the left lower field.  Neurological: He appears lethargic.  Skin: Skin is warm and dry.    Vital Signs: BP (!) 72/38 (BP Location: Right Arm)   Pulse 67   Temp 97.8 F (36.6 C) (Oral)   Resp 13   Ht 5\' 6"  (1.676 m)   Wt 92.7 kg (204 lb 5.9 oz)   SpO2 93%   BMI 32.99 kg/m  Pain Assessment: No/denies pain POSS *See Group Information*: S-Acceptable,Sleep, easy to arouse Pain Score: 0-No pain   SpO2: SpO2: 93 % O2 Device:SpO2: 93 % O2 Flow Rate: .O2 Flow Rate (L/min): 3 L/min  IO: Intake/output summary:  Intake/Output Summary (Last 24 hours) at 11/28/15 1420 Last data filed at 11/28/15 1200  Gross per 24 hour  Intake              340 ml  Output                1 ml  Net              339 ml    LBM: Last BM Date: 11/27/15 Baseline Weight: Weight: 88 kg (194 lb) Most recent weight: Weight: 92.7 kg (204 lb 5.9 oz)      Palliative Assessment/Data: 20 %    Discussed with Dr Cherlynn Kaiser  Time In: 1420 Time Out: 1540 Time  Total: 80  min Greater than 50%  of this time was spent counseling and coordinating care related to the above assessment and plan.  Signed by: Lorinda Creed, NP   Please contact Palliative Medicine  Team phone at [phone] for questions and concerns.  For individual provider: See Loretha Stapler

## 2015-11-28 NOTE — Progress Notes (Signed)
PT Cancellation Note  Patient Details Name: Johnathan RIPPON Sr. MRN: [id number] DOB: 30-Aug-1922   Cancelled Treatment:    Reason Eval/Treat Not Completed: Fatigue/lethargy limiting ability to participate (Per discussion with treatment team, patient now off levophed, anticipating transition to comfort care this date.  However, palliative NP Lorinda Creed plans to continue involving PT services within patient's plan of care at this time, as therapy/movement is comforting to patient/family at this time (to patient's tolerance).  Will continue to offer services as appropriate, but allow patient/family to guide progression as appropriate.)   Dent Plantz H. Manson Passey, PT, DPT, NCS 11/28/15, 11:42 AM [phone]

## 2015-11-28 NOTE — Progress Notes (Signed)
Patient off Levo gtt. Blood pressure and MAP has been low this shift.Reported by previous shift nurse that plan for patient is Palliative Consult, with comfort care and transfer to Oncology. Will pass on to day shift nurse today. No orders at this time. Family was concerned pt was confused at night. Pt has been confused on and off over the past several nights. Up to recliner earlier in shift and was assisted back to bed with two person assist. Pt stated he can't use his hands well, and having trouble standing. Had a BM yesterday.

## 2015-11-29 DIAGNOSIS — R0600 Dyspnea, unspecified: Secondary | ICD-10-CM

## 2015-11-29 MED ORDER — BISACODYL 10 MG RE SUPP: 10.0000 mg | Freq: Every day | RECTAL | Status: DC | PRN

## 2015-11-29 MED ORDER — MORPHINE SULFATE (CONCENTRATE) 10 MG/0.5ML PO SOLN: 5.0000 mg | ORAL | Status: AC | PRN

## 2015-11-29 MED ORDER — MORPHINE SULFATE (CONCENTRATE) 10 MG/0.5ML PO SOLN
5.0000 mg | ORAL | Status: AC
  Administered 2015-11-29: 12:00:00 5 mg via ORAL
  Filled 2015-11-29: qty 1

## 2015-11-29 MED ORDER — MORPHINE SULFATE (CONCENTRATE) 10 MG/0.5ML PO SOLN: 5.0000 mg | ORAL | Status: DC | PRN

## 2015-11-29 MED ORDER — LORAZEPAM 1 MG PO TABS: 1.0000 mg | ORAL_TABLET | ORAL | 0 refills | Status: AC | PRN

## 2015-11-29 NOTE — Care Management Important Message (Signed)
Important Message  Patient Details  Name: Johnathan FAGA Sr. MRN: [id number] Date of Birth: 1922-10-22   Medicare Important Message Given:  Yes    Gwenette Greet, RN 11/29/2015, 7:57 AM

## 2015-11-29 NOTE — Progress Notes (Signed)
Daily Progress Note   Patient Name: Johnathan DUMAN Sr.       Date: 11/29/2015 DOB: 01-06-1923  Age: 82 y.o. MRN#: [id number] Attending Physician: Houston Siren, MD Primary Care Physician: Marisue Ivan, MD Admit Date: 11/20/2015  Reason for Consultation/Follow-up: Establishing goals of care, Non pain symptom management, Pain control and Psychosocial/spiritual support  Subjective:  - patient is lethargic, but continues minimal communication with his family  -focus of care is comfort and dignity, hopeful for hospice facility bed today  - discussed with family likely prognosis of less than one week, discussed natural trajectory and expectations at EOL  Length of Stay: 9  Current Medications: Scheduled Meds:  . digoxin  0.0625 mg Oral QODAY  . docusate sodium  100 mg Oral BID  . feeding supplement (NEPRO CARB STEADY)  237 mL Oral BID BM  . hydrocortisone sod succinate (SOLU-CORTEF) inj  50 mg Intravenous Q12H  . morphine CONCENTRATE  5 mg Oral NOW  . sodium chloride flush  3 mL Intravenous Q12H    Continuous Infusions:    PRN Meds: acetaminophen **OR** [DISCONTINUED] acetaminophen, bisacodyl, hydroxypropyl methylcellulose / hypromellose, ipratropium-albuterol, lidocaine-prilocaine, morphine injection, morphine CONCENTRATE, nitroGLYCERIN, [DISCONTINUED] ondansetron **OR** ondansetron (ZOFRAN) IV, sodium chloride flush  Physical Exam  Constitutional: He appears lethargic. He appears ill.  Cardiovascular: Normal rate, regular rhythm and normal heart sounds.   Pulmonary/Chest: He has decreased breath sounds in the right lower field and the left lower field. He has wheezes.  Neurological: He appears lethargic.            Vital Signs: BP (!) 99/51 (BP Location: Right Arm)   Pulse  73   Temp 97.8 F (36.6 C) (Oral)   Resp 16   Ht  (1.676 m)   Wt 92.7 kg (204 lb 5.9 oz)   SpO2 98%   BMI 32.99 kg/m  SpO2: SpO2: 98 % O2 Device: O2 Device: Nasal Cannula O2 Flow Rate: O2 Flow Rate (L/min): 1 L/min  Intake/output summary:  Intake/Output Summary (Last 24 hours) at 11/29/15 0900 Last data filed at 11/28/15 1600  Gross per 24 hour  Intake               80 ml  Output  0 ml  Net               80 ml   LBM: Last BM Date: 11/27/15 Baseline Weight: Weight: 88 kg (194 lb) Most recent weight: Weight: 92.7 kg (204 lb 5.9 oz)       Palliative Assessment/Data: 20 %       Patient Active Problem List   Diagnosis Date Noted  . DNR (do not resuscitate)   . Palliative care encounter   . Tachyarrhythmia   . Arterial hypotension   . NSTEMI (non-ST elevated myocardial infarction) (HCC) 11/20/2015  . ESRD on hemodialysis (HCC) 11/20/2015  . HTN, goal below 140/80 11/20/2015  . Atypical chest pain 11/02/2015    Palliative Care Assessment & Plan    Assessment: - h/o ESRD/dialysis dependant/decsion for no further dialysis, CAD   Focus is for full comfort -transitioning at EOL  Recommendations/Plan:  Dyspnea/Pain: Morphine IV/Roxanol po/sl  Agitation: Ativan 1 mg IV every 4 hrs prn   Goals of Care and Additional Recommendations:  Limitations on Scope of Treatment: Full Comfort Care  Code Status:    Code Status Orders        Start     Ordered   11/22/15 1947  Do not attempt resuscitation (DNR)  Continuous    Question Answer Comment  In the event of cardiac or respiratory ARREST Do not call a "code blue"   In the event of cardiac or respiratory ARREST Do not perform Intubation, CPR, defibrillation or ACLS   In the event of cardiac or respiratory ARREST Use medication by any route, position, wound care, and other measures to relive pain and suffering. May use oxygen, suction and manual treatment of airway obstruction as needed for  comfort.      11/22/15 1947    Code Status History    Date Active Date Inactive Code Status Order ID Comments User Context   11/20/2015  4:50 PM 11/21/2015 10:15 AM Full Code [id number]  Marguarite Arbour, MD Inpatient   11/02/2015  7:45 AM 11/02/2015  9:17 PM Full Code [id number]  Arnaldo Natal, MD Inpatient    Advance Directive Documentation   Flowsheet Row Most Recent Value  Type of Advance Directive  Living will  Pre-existing out of facility DNR order (yellow form or pink MOST form)  No data  "MOST" Form in Place?  No data       Prognosis:   < 2 weeks  Discharge Planning:  Hospice facility  Care plan was discussed with Dr Cherlynn Kaiser   Thank you for allowing the Palliative Medicine Team to assist in the care of this patient.   Time In: 0830 Time Out: 0855 Total Time 25 min Prolonged Time Billed  no       Greater than 50%  of this time was spent counseling and coordinating care related to the above assessment and plan.  Lorinda Creed, NP  Please contact Palliative Medicine Team phone at [phone] for questions and concerns.

## 2015-11-29 NOTE — Progress Notes (Signed)
Speech Therapy Note: reviewed chart notes; pt has moved to comfort care status at this time. Palliative Care working w/ family. ST services will be available for any further education as needed re: swallowing while admitted. Care Manager stated pt would be transferring to Hospice Home when a bed is available.

## 2015-11-29 NOTE — Progress Notes (Signed)
MD order received to discharge pt to the Hospice [facility] today; Clydie Braun, [facility] liason prepared discharge packet and called report to the [facility]ospice [facility]; EMS present for pt discharge, discharge packet given to EMS personnel to take to the [facility]ospice [facility]; pt discharged via stretcher to the Hospice Home

## 2015-11-29 NOTE — Discharge Summary (Signed)
[employer] - [place] at [facility]   PATIENT NAME: [name]    MR#:  [id number]  DATE OF BIRTH:  20-Nov-1922  DATE OF ADMISSION:  11/20/2015 ADMITTING PHYSICIAN: Marguarite Arbour, MD  DATE OF DISCHARGE: 11/29/2015  PRIMARY CARE PHYSICIAN: Marisue Ivan, MD    ADMISSION DIAGNOSIS:  Atypical chest pain [R07.89] NSTEMI (non-ST elevated myocardial infarction) (HCC) [I21.4]  DISCHARGE DIAGNOSIS:  Principal Problem:   NSTEMI (non-ST elevated myocardial infarction) (HCC) Active Problems:   Atypical chest pain   ESRD on hemodialysis (HCC)   HTN, goal below 140/80   Tachyarrhythmia   Arterial hypotension   DNR (do not resuscitate)   Palliative care encounter   SECONDARY DIAGNOSIS:   Past Medical History:  Diagnosis Date  . Arthritis   . Bronchitis   . Chronic kidney disease   . Coronary artery disease   . Diabetes mellitus without complication (HCC)   . GERD (gastroesophageal reflux disease)   . Gout   . Hypercholesterolemia   . Hyperlipidemia   . Hypertension   . Hypothyroidism   . Obesity   . Shingles   . Sleep apnea     HOSPITAL COURSE:   82 year old male with ESRD on hemodialysis, anemia of chronic disease, diabetes and CAD who presented with chest pain and found to have non-STEMI.  1. Non-ST elevation MI-he was managed medically with heparin nomogram, aspirin, beta blocker statin. -She was seen by cardiology who did not want to pursue aggressive care given his multiple comorbidities. And also his renal function being poor and being on hemodialysis.  2. Atrial fibrillation with RVR-was on amiodarone drip and then weaned off it.   - rates were better controlled on Oral Amio, B-blocker.   3. End-stage renal disease on hemodialysis- Nephrology was consulted and pt. Was getting his HD on on MWF.   4. Diabetes type 2 with renal medications status patient was maintained on sliding scale insulin as blood sugars remained stable.  5.  Hypotension-this was multifactorial in nature related to underlying A. fib with RVR with possible underlying sepsis. Patient was maintained on a IV Levophed drip for a few days but after palliative care discussion with patient and patient's family and when he was made comfort care only he was taken off Levophed.    6. BPH 7. Hypothyroidism 8. Delirium-multifactorial nature much improved now. 9. Aspiration pneumonia   After aggressive care on patient's clinical symptoms were not improving. He remained  lethargic/encephalopathic. Patient and patient's family had a long discussion with palliative care and after those discussions patient was made comfort care only. He is now being discharged to hospice home for further care.  DISCHARGE CONDITIONS:   Stable  CONSULTS OBTAINED:  Treatment Team:  Mosetta Pigeon, MD Laurier Nancy, MD Dalia Heading, MD Alwyn Pea, MD  DRUG ALLERGIES:   Allergies  Allergen Reactions  . Cinacalcet Other (See Comments)    Reaction: unknown  . Lisinopril Cough    DISCHARGE MEDICATIONS:   Current Discharge Medication List    START taking these medications   Details  LORazepam (ATIVAN) 1 MG tablet Take 1 tablet (1 mg total) by mouth every 4 (four) hours as needed for anxiety. Qty: 30 tablet, Refills: 0    Morphine Sulfate (MORPHINE CONCENTRATE) 10 MG/0.5ML SOLN concentrated solution Take 0.25 mLs (5 mg total) by mouth every hour as needed for moderate pain, severe pain or shortness of breath. Qty: 180 mL      CONTINUE these medications  which have NOT CHANGED   Details  acetaminophen (TYLENOL) 325 MG tablet Take 650 mg by mouth every 8 (eight) hours as needed for mild pain, moderate pain or fever.     hydroxypropyl methylcellulose / hypromellose (ISOPTO TEARS / GONIOVISC) 2.5 % ophthalmic solution Place 1 drop into both eyes 2 (two) times daily.       STOP taking these medications     albuterol-ipratropium (COMBIVENT) 18-103 MCG/ACT inhaler       allopurinol (ZYLOPRIM) 100 MG tablet      aspirin 81 MG chewable tablet      atorvastatin (LIPITOR) 80 MG tablet      b complex-vitamin c-folic acid (NEPHRO-VITE) 0.8 MG TABS tablet      calcium acetate (PHOSLO) 667 MG capsule      clopidogrel (PLAVIX) 75 MG tablet      docusate sodium (COLACE) 100 MG capsule      finasteride (PROSCAR) 5 MG tablet      flunisolide (NASAREL) 29 MCG/ACT (0.025%) nasal spray      furosemide (LASIX) 40 MG tablet      gabapentin (NEURONTIN) 100 MG capsule      glipiZIDE (GLUCOTROL) 5 MG tablet      insulin glargine (LANTUS) 100 UNIT/ML injection      isosorbide mononitrate (IMDUR) 30 MG 24 hr tablet      LACTOBACILLUS PO      levothyroxine (SYNTHROID, LEVOTHROID) 50 MCG tablet      lidocaine-prilocaine (EMLA) cream      loratadine (CLARITIN) 10 MG tablet      omega-3 acid ethyl esters (LOVAZA) 1 g capsule      omeprazole (PRILOSEC) 40 MG capsule          DISCHARGE INSTRUCTIONS:   DIET:  Regular diet  DISCHARGE CONDITION:  Stable  ACTIVITY:  Activity as tolerated  OXYGEN:  Home Oxygen: Yes.     Oxygen Delivery: 2 liters/min via Patient connected to nasal cannula oxygen  DISCHARGE LOCATION:  Hospice home   If you experience worsening of your admission symptoms, develop shortness of breath, life threatening emergency, suicidal or homicidal thoughts you must seek medical attention immediately by calling 911 or calling your MD immediately  if symptoms less severe.  You Must read complete instructions/literature along with all the possible adverse reactions/side effects for all the Medicines you take and that have been prescribed to you. Take any new Medicines after you have completely understood and accpet all the possible adverse reactions/side effects.   Please note  You were cared for by a hospitalist during your hospital stay. If you have any questions about your discharge medications or the care you received  while you were in the hospital after you are discharged, you can call the unit and asked to speak with the hospitalist on call if the hospitalist that took care of you is not available. Once you are discharged, your primary care physician will handle any further medical issues. Please note that NO REFILLS for any discharge medications will be authorized once you are discharged, as it is imperative that you return to your primary care physician (or establish a relationship with a primary care physician if you do not have one) for your aftercare needs so that they can reassess your need for medications and monitor your lab values.   Management plans discussed with the patient, family and they are in agreement.  CODE STATUS:     Code Status Orders        Start  Ordered   11/22/15 1947  Do not attempt resuscitation (DNR)  Continuous    Question Answer Comment  In the event of cardiac or respiratory ARREST Do not call a "code blue"   In the event of cardiac or respiratory ARREST Do not perform Intubation, CPR, defibrillation or ACLS   In the event of cardiac or respiratory ARREST Use medication by any route, position, wound care, and other measures to relive pain and suffering. May use oxygen, suction and manual treatment of airway obstruction as needed for comfort.      11/22/15 1947    Code Status History    Date Active Date Inactive Code Status Order ID Comments User Context   11/20/2015  4:50 PM 11/21/2015 10:15 AM Full Code [id number]  Marguarite Arbour, MD Inpatient   11/02/2015  7:45 AM 11/02/2015  9:17 PM Full Code [id number]  Arnaldo Natal, MD Inpatient    Advance Directive Documentation   Flowsheet Row Most Recent Value  Type of Advance Directive  Living will  Pre-existing out of facility DNR order (yellow form or pink MOST form)  No data  "MOST" Form in Place?  No data      TOTAL TIME TAKING CARE OF THIS PATIENT: 40 minutes.    Houston Siren M.D on 11/29/2015 at 1:23  PM  Between 7am to 6pm - Pager - [phone]  After 6pm go to [email or web address] - password [email or web address] [facility]  [place] [facility]  Office  [phone]  CC: Primary care physician; Marisue Ivan, MD

## 2015-11-29 NOTE — Progress Notes (Signed)
Brief cardiology note The patient is receiving palliative care with comfort care only. Granddaughter is at the bedside and reports that the patient has been comfortable and rested well through the night.

## 2015-11-29 NOTE — Care Management (Signed)
Discharge summary faxed to [facility] per request.  Gwenette Greet RN MSN CCM Care Management [phone]

## 2015-11-29 NOTE — Progress Notes (Signed)
CSW informed by NP Stanton Kidney that patient is appropriate for residential hospice. CSW met with family. Their preference is [place]. CSW informed Santiago Glad- Liaison of [facility]. MD completed Discharge summary. EMS form and discharge packet complete.  Ernest Pine, MSW, LCSW, [place] Clinical Social Worker [phone]

## 2015-11-29 NOTE — Progress Notes (Signed)
[employer] -  at [facility]   PATIENT NAME: [name]    MR#:  [id number]  DATE OF BIRTH:  May 27, 1922  SUBJECTIVE:   Pt. Made comfort care only yesterday but Palliative Care.  Lying in bed in NAD.  No complaints or events overnight.   REVIEW OF SYSTEMS:    Review of Systems  Unable to perform ROS: Mental acuity    Nutrition: heart Healthy/Renal Tolerating Diet: Pleasure feeds.   DRUG ALLERGIES:   Allergies  Allergen Reactions  . Cinacalcet Other (See Comments)    Reaction: unknown  . Lisinopril Cough    VITALS:  Blood pressure (!) 99/51, pulse 73, temperature 97.8 F (36.6 C), temperature source Oral, resp. rate 16, height  (1.676 m), weight 92.7 kg (204 lb 5.9 oz), SpO2 98 %.  PHYSICAL EXAMINATION:   Physical Exam  GENERAL:  82 y.o.-year-old obese patient lying in bed lethargic/comfortable.  EYES: Pupils equal, round, reactive to light. No scleral icterus. Extraocular muscles intact.  HEENT: Head atraumatic, normocephalic. Oropharynx and nasopharynx clear.  NECK:  Supple, no jugular venous distention. No thyroid enlargement, no tenderness.  LUNGS: Normal breath sounds bilaterally, no wheezing, rales, rhonchi. No use of accessory muscles of respiration.  CARDIOVASCULAR: S1, S2 Tachy, irregular.  No murmurs, rubs, or gallops.  ABDOMEN: Soft, nontender, nondistended. Bowel sounds present. No organomegaly or mass.  EXTREMITIES: No cyanosis, clubbing, +1-2 edema b/l.  LUE > RUE.     NEUROLOGIC: Encephalopathic/Lethargic.Globally weak. Difficult to do a full neurological exam. PSYCHIATRIC: Encephalopathic/lethargic. SKIN: No obvious rash, lesion, or ulcer.   Left upper ext. AV fistula with good bruit, good thrill.    LABORATORY PANEL:   CBC  Recent Labs Lab 11/28/15 0625  WBC 9.9  HGB 13.3  HCT 37.8*  PLT 177    ------------------------------------------------------------------------------------------------------------------  Chemistries   Recent Labs Lab 11/23/15 1202  11/28/15 0625  NA  --   < > 134*  K  --   < > 3.9  CL  --   < > 90*  CO2  --   < > 30  GLUCOSE  --   < > 188*  BUN  --   < > 67*  CREATININE  --   < > 7.40*  CALCIUM  --   < > 10.4*  MG  --   < > 2.1  AST 23  --   --   ALT 13*  --   --   ALKPHOS 50  --   --   BILITOT 0.4  --   --   < > = values in this interval not displayed. ------------------------------------------------------------------------------------------------------------------  Cardiac Enzymes  Recent Labs Lab 11/22/15 1932  TROPONINI 2.36*   ------------------------------------------------------------------------------------------------------------------  RADIOLOGY:  No results found.   ASSESSMENT AND PLAN:   82 year old male with ESRD on hemodialysis, anemia of chronic disease, diabetes and CAD who presented with chest pain and found to have non-STEMI.  1. Non-ST elevation MI 2. Atrial fibrillation with RVR 3. End-stage renal disease on hemodialysis 4. Diabetes type 2 with renal complications 5. BPH 6. Hypothyroidism 7. Hypotension 8. Aspiration pneumonia   - appreciate Palliative Care and pt. Made COMFORT CARE ONLY yesterdayd.  - cont. PRN Morphine.  Cont. Pleasure feeds.  - awaiting Hospice Home Bed.  Possible d/c there tomorrow.    All the records are reviewed and case discussed with Care Management/Social Workerr. Management plans discussed with the patient, family and they are in agreement.  CODE STATUS: DO  NOT RESUSCITATE  DVT Prophylaxis: Heparin subcutaneous  TOTAL TIME TAKING CARE OF THIS PATIENT: 25 minutes.   POSSIBLE D/C IN 1-2 DAYS, DEPENDING ON CLINICAL CONDITION.   Houston Siren M.D on 11/29/2015 at 12:49 PM  Between 7am to 6pm - Pager - [phone]  After 6pm go to [email or web address] - password [email or web address]  [facility]  [place] [facility]  Office  [phone]  CC: Primary care physician; Marisue Ivan, MD

## 2015-11-29 NOTE — Progress Notes (Signed)
New hospice home referral received from [place]. Mr. [name] is a 82 year old man with ESRD on MWF dialysis, admitted to [facility] on 7/23 for evaluation of chest pain, troponin 3.11. He was initiated on a heparin drip, on 7/24 his troponin elevated to 7.07 and he developed arrhythmias and required Iv levophed for blood pressure support. Cardiology did not recommend heart catheterization d/t his age and multiple comorbidities. Family met with Palliative NP Wadie Lessen on 7/31 and chose to focus on comfort, no further dialysis or blood pressure support with plan to transfer to the hospice home. Writer met in the family room with patient's daughter and Drema Pry and granddaughter Judeen Hammans to initiate education regarding hospice services, philosophy of and team approach to care with good understanding voiced. Questions answered, consents signed. Patient information faxed to Hospice referral. Plan is for transport to the hospice home via EMS today with signed portable DNR in place. Hospital care team all aware of and in agreement with plan. Report called to the hospice home. EMS to be called by writer at 3:30 pm. Thank you for the opportunity to be involved in the care of this patient and his family. Flo Shanks RN, BSN, [facility] [place], [facility] [phone] c

## 2015-11-30 LAB — CULTURE, BLOOD (ROUTINE X 2)
Culture: NO GROWTH
Culture: NO GROWTH

## 2015-12-30 DEATH — deceased

## 2017-01-03 IMAGING — DX DG CHEST 1V PORT
1 series · 1 of 1 positions shown · non-contrast
Comparison: November 25, 2015

CLINICAL DATA: Acute respiratory failure

EXAM:
PORTABLE CHEST 1 VIEW

[chest ap]
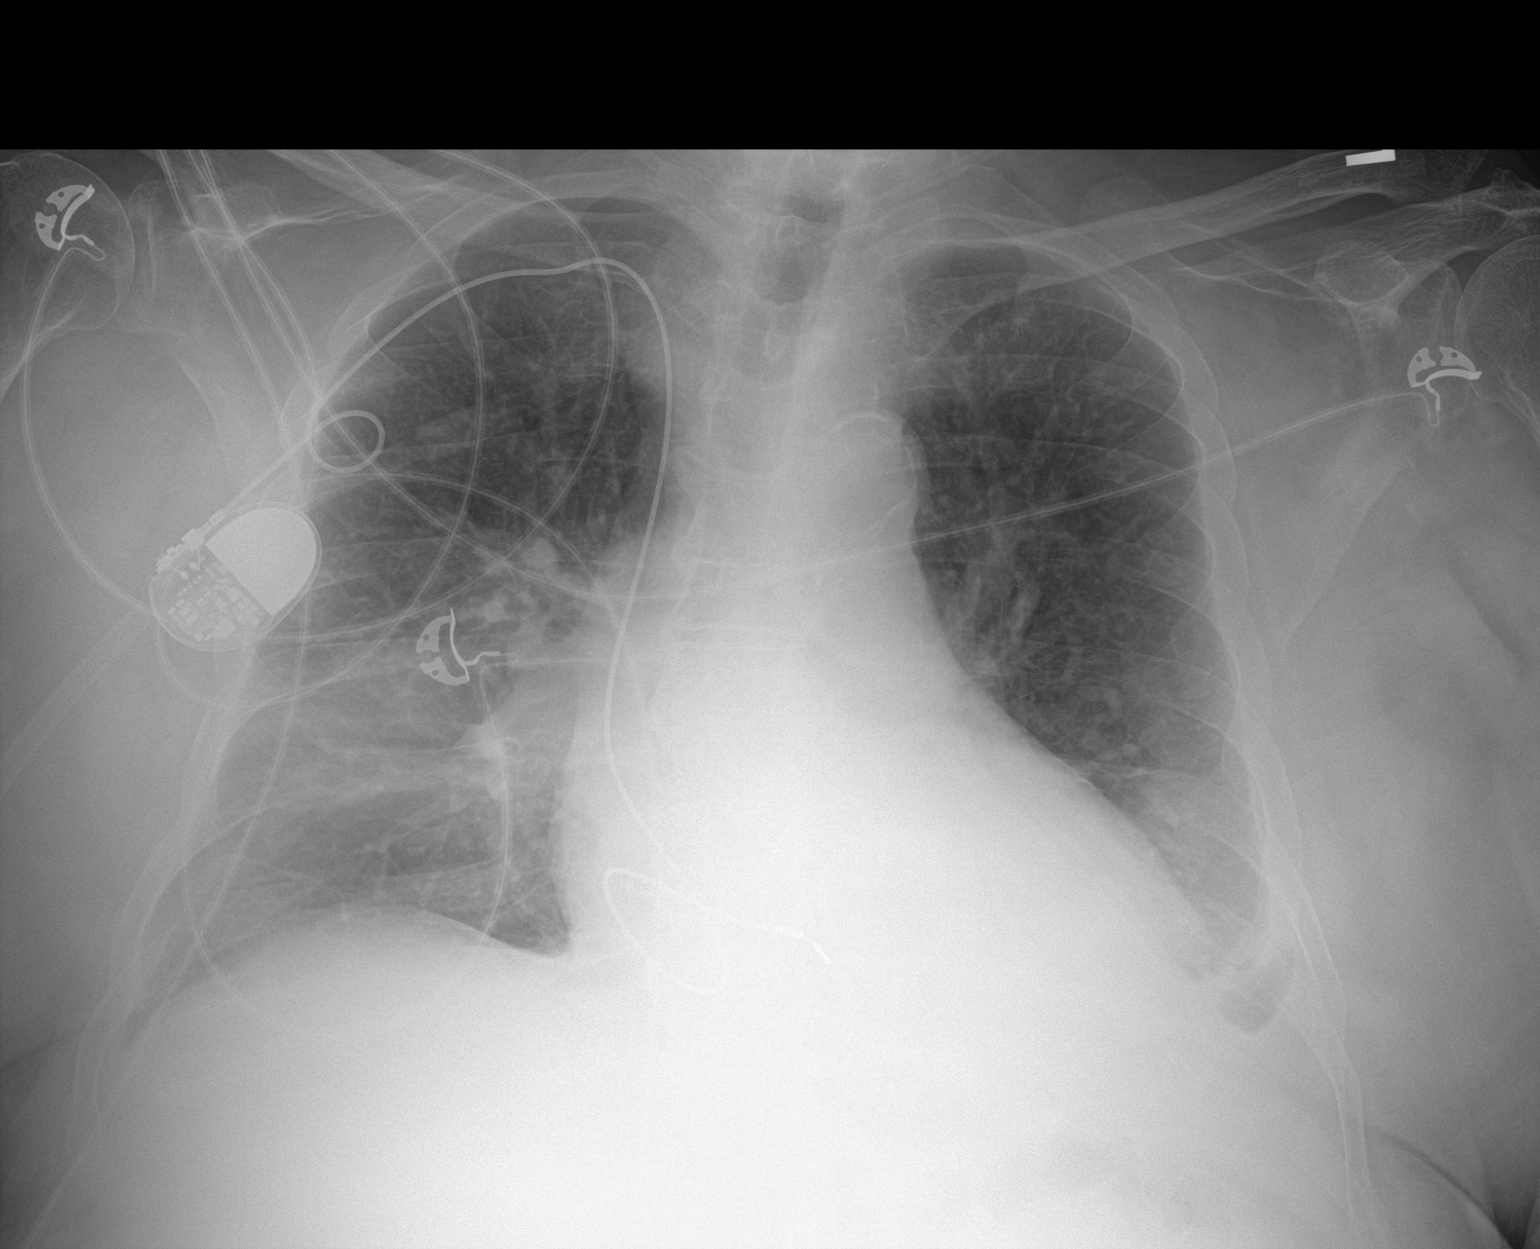

[1 of 1 positions shown; findings below may reference images not displayed]

FINDINGS: No pneumothorax. Stable mild cardiomegaly. A layering effusion and
underlying atelectasis on the left is stable. Stable haziness in the
right perihilar region may represent layering effusion or developing
infiltrate. Recommend attention on follow-up. No other interval
changes or acute abnormalities.
IMPRESSION: 1. Stable small effusion and atelectasis on the left.
2. Increasing focal haziness in the right perihilar region may
represent layering effusion versus developing infiltrate. Recommend
attention on follow-up.
3. No other acute abnormalities.
